# Patient Record
Sex: Female | Born: 1935 | Race: White | Hispanic: No | Smoking: Never smoker
Health system: Southern US, Community
[De-identification: ages and names within clinical notes are randomized; demographics above are authoritative.]

## PROBLEM LIST (undated history)

## (undated) DIAGNOSIS — I10 Essential (primary) hypertension: Secondary | ICD-10-CM

## (undated) DIAGNOSIS — I809 Phlebitis and thrombophlebitis of unspecified site: Secondary | ICD-10-CM

## (undated) DIAGNOSIS — E785 Hyperlipidemia, unspecified: Secondary | ICD-10-CM

## (undated) DIAGNOSIS — N3946 Mixed incontinence: Secondary | ICD-10-CM

## (undated) DIAGNOSIS — Z973 Presence of spectacles and contact lenses: Secondary | ICD-10-CM

## (undated) DIAGNOSIS — M069 Rheumatoid arthritis, unspecified: Secondary | ICD-10-CM

## (undated) DIAGNOSIS — M1990 Unspecified osteoarthritis, unspecified site: Secondary | ICD-10-CM

## (undated) DIAGNOSIS — K429 Umbilical hernia without obstruction or gangrene: Secondary | ICD-10-CM

## (undated) DIAGNOSIS — Z974 Presence of external hearing-aid: Secondary | ICD-10-CM

## (undated) DIAGNOSIS — F32A Depression, unspecified: Secondary | ICD-10-CM

## (undated) DIAGNOSIS — M25512 Pain in left shoulder: Secondary | ICD-10-CM

## (undated) DIAGNOSIS — Z972 Presence of dental prosthetic device (complete) (partial): Secondary | ICD-10-CM

## (undated) DIAGNOSIS — N3289 Other specified disorders of bladder: Secondary | ICD-10-CM

## (undated) DIAGNOSIS — E663 Overweight: Secondary | ICD-10-CM

## (undated) DIAGNOSIS — K08109 Complete loss of teeth, unspecified cause, unspecified class: Secondary | ICD-10-CM

## (undated) DIAGNOSIS — M549 Dorsalgia, unspecified: Secondary | ICD-10-CM

## (undated) DIAGNOSIS — I2510 Atherosclerotic heart disease of native coronary artery without angina pectoris: Secondary | ICD-10-CM

## (undated) DIAGNOSIS — R269 Unspecified abnormalities of gait and mobility: Secondary | ICD-10-CM

## (undated) DIAGNOSIS — M25511 Pain in right shoulder: Secondary | ICD-10-CM

## (undated) DIAGNOSIS — Z8489 Family history of other specified conditions: Secondary | ICD-10-CM

## (undated) DIAGNOSIS — F329 Major depressive disorder, single episode, unspecified: Secondary | ICD-10-CM

## (undated) DIAGNOSIS — F419 Anxiety disorder, unspecified: Secondary | ICD-10-CM

## (undated) DIAGNOSIS — Y92009 Unspecified place in unspecified non-institutional (private) residence as the place of occurrence of the external cause: Secondary | ICD-10-CM

## (undated) DIAGNOSIS — K589 Irritable bowel syndrome without diarrhea: Secondary | ICD-10-CM

## (undated) DIAGNOSIS — W19XXXA Unspecified fall, initial encounter: Secondary | ICD-10-CM

## (undated) DIAGNOSIS — N390 Urinary tract infection, site not specified: Secondary | ICD-10-CM

## (undated) DIAGNOSIS — G629 Polyneuropathy, unspecified: Secondary | ICD-10-CM

## (undated) DIAGNOSIS — K2970 Gastritis, unspecified, without bleeding: Secondary | ICD-10-CM

## (undated) HISTORY — DX: Atherosclerotic heart disease of native coronary artery without angina pectoris: I25.10

## (undated) HISTORY — DX: Unspecified osteoarthritis, unspecified site: M19.90

## (undated) HISTORY — PX: OTHER SURGICAL HISTORY: SHX169

## (undated) HISTORY — DX: Gastritis, unspecified, without bleeding: K29.70

## (undated) HISTORY — PX: EYE SURGERY: SHX253

## (undated) HISTORY — DX: Umbilical hernia without obstruction or gangrene: K42.9

## (undated) HISTORY — PX: TOTAL KNEE ARTHROPLASTY: SHX125

## (undated) HISTORY — DX: Pain in right shoulder: M25.511

## (undated) HISTORY — DX: Major depressive disorder, single episode, unspecified: F32.9

## (undated) HISTORY — DX: Depression, unspecified: F32.A

## (undated) HISTORY — DX: Phlebitis and thrombophlebitis of unspecified site: I80.9

## (undated) HISTORY — PX: SHOULDER ARTHROSCOPY: SHX128

## (undated) HISTORY — PX: APPENDECTOMY: SHX54

## (undated) HISTORY — PX: ABDOMINAL HYSTERECTOMY: SHX81

## (undated) HISTORY — DX: Dorsalgia, unspecified: M54.9

## (undated) HISTORY — DX: Anxiety disorder, unspecified: F41.9

## (undated) HISTORY — DX: Irritable bowel syndrome, unspecified: K58.9

## (undated) HISTORY — DX: Essential (primary) hypertension: I10

## (undated) HISTORY — PX: CHOLECYSTECTOMY: SHX55

## (undated) HISTORY — DX: Unspecified abnormalities of gait and mobility: R26.9

## (undated) HISTORY — DX: Hyperlipidemia, unspecified: E78.5

## (undated) HISTORY — PX: TONSILLECTOMY: SUR1361

## (undated) HISTORY — DX: Overweight: E66.3

## (undated) HISTORY — DX: Pain in left shoulder: M25.512

## (undated) HISTORY — PX: JOINT REPLACEMENT: SHX530

## (undated) HISTORY — PX: HEEL SPUR SURGERY: SHX665

## (undated) HISTORY — DX: Mixed incontinence: N39.46

---

## 1998-10-01 ENCOUNTER — Ambulatory Visit (HOSPITAL_BASED_OUTPATIENT_CLINIC_OR_DEPARTMENT_OTHER): Admission: RE | Admit: 1998-10-01 | Discharge: 1998-10-01 | Payer: Self-pay | Admitting: Orthopedic Surgery

## 1998-12-08 ENCOUNTER — Encounter: Payer: Self-pay | Admitting: Family Medicine

## 1998-12-08 ENCOUNTER — Encounter: Admission: RE | Admit: 1998-12-08 | Discharge: 1998-12-08 | Payer: Self-pay | Admitting: Family Medicine

## 2001-08-13 ENCOUNTER — Encounter: Payer: Self-pay | Admitting: Orthopedic Surgery

## 2001-08-13 ENCOUNTER — Encounter: Admission: RE | Admit: 2001-08-13 | Discharge: 2001-08-13 | Payer: Self-pay | Admitting: Orthopedic Surgery

## 2002-11-22 ENCOUNTER — Encounter: Admission: RE | Admit: 2002-11-22 | Discharge: 2002-11-22 | Payer: Self-pay | Admitting: Family Medicine

## 2002-11-22 ENCOUNTER — Encounter: Payer: Self-pay | Admitting: Family Medicine

## 2003-06-02 ENCOUNTER — Encounter: Admission: RE | Admit: 2003-06-02 | Discharge: 2003-06-02 | Payer: Self-pay | Admitting: Orthopedic Surgery

## 2003-07-29 ENCOUNTER — Other Ambulatory Visit: Admission: RE | Admit: 2003-07-29 | Discharge: 2003-07-29 | Payer: Self-pay | Admitting: Family Medicine

## 2003-07-30 ENCOUNTER — Encounter: Admission: RE | Admit: 2003-07-30 | Discharge: 2003-07-30 | Payer: Self-pay | Admitting: Family Medicine

## 2003-12-09 ENCOUNTER — Ambulatory Visit: Payer: Self-pay | Admitting: Family Medicine

## 2004-04-06 ENCOUNTER — Ambulatory Visit: Payer: Self-pay | Admitting: Family Medicine

## 2004-11-16 ENCOUNTER — Ambulatory Visit: Payer: Self-pay | Admitting: Family Medicine

## 2004-11-19 ENCOUNTER — Ambulatory Visit: Payer: Self-pay | Admitting: Family Medicine

## 2004-12-21 ENCOUNTER — Ambulatory Visit: Payer: Self-pay | Admitting: Family Medicine

## 2005-01-31 LAB — HM MAMMOGRAPHY

## 2005-09-23 ENCOUNTER — Ambulatory Visit: Payer: Self-pay | Admitting: Family Medicine

## 2005-10-12 ENCOUNTER — Ambulatory Visit: Payer: Self-pay | Admitting: Family Medicine

## 2005-11-01 ENCOUNTER — Ambulatory Visit: Payer: Self-pay | Admitting: Family Medicine

## 2006-03-28 ENCOUNTER — Ambulatory Visit: Payer: Self-pay | Admitting: Family Medicine

## 2006-09-13 ENCOUNTER — Ambulatory Visit: Payer: Self-pay | Admitting: Family Medicine

## 2006-11-01 ENCOUNTER — Ambulatory Visit: Payer: Self-pay | Admitting: Family Medicine

## 2006-11-03 ENCOUNTER — Ambulatory Visit: Payer: Self-pay | Admitting: Family Medicine

## 2006-11-03 DIAGNOSIS — K2990 Gastroduodenitis, unspecified, without bleeding: Secondary | ICD-10-CM

## 2006-11-03 DIAGNOSIS — I10 Essential (primary) hypertension: Secondary | ICD-10-CM

## 2006-11-03 DIAGNOSIS — M1990 Unspecified osteoarthritis, unspecified site: Secondary | ICD-10-CM

## 2006-11-03 DIAGNOSIS — K2970 Gastritis, unspecified, without bleeding: Secondary | ICD-10-CM | POA: Insufficient documentation

## 2006-11-03 DIAGNOSIS — F418 Other specified anxiety disorders: Secondary | ICD-10-CM

## 2006-11-03 DIAGNOSIS — E785 Hyperlipidemia, unspecified: Secondary | ICD-10-CM

## 2006-11-03 DIAGNOSIS — N3946 Mixed incontinence: Secondary | ICD-10-CM

## 2006-11-06 ENCOUNTER — Observation Stay (HOSPITAL_COMMUNITY): Admission: EM | Admit: 2006-11-06 | Discharge: 2006-11-07 | Payer: Self-pay | Admitting: Emergency Medicine

## 2006-11-06 ENCOUNTER — Encounter: Payer: Self-pay | Admitting: Family Medicine

## 2006-11-06 ENCOUNTER — Ambulatory Visit: Payer: Self-pay | Admitting: Internal Medicine

## 2006-11-06 ENCOUNTER — Ambulatory Visit: Payer: Self-pay | Admitting: Cardiology

## 2006-11-07 ENCOUNTER — Encounter: Payer: Self-pay | Admitting: Internal Medicine

## 2006-11-07 ENCOUNTER — Encounter: Payer: Self-pay | Admitting: Family Medicine

## 2006-11-21 ENCOUNTER — Telehealth: Payer: Self-pay | Admitting: Family Medicine

## 2006-12-20 ENCOUNTER — Telehealth: Payer: Self-pay | Admitting: Family Medicine

## 2006-12-26 ENCOUNTER — Ambulatory Visit: Payer: Self-pay | Admitting: Family Medicine

## 2007-01-08 ENCOUNTER — Ambulatory Visit: Payer: Self-pay | Admitting: Pulmonary Disease

## 2007-01-08 DIAGNOSIS — I2510 Atherosclerotic heart disease of native coronary artery without angina pectoris: Secondary | ICD-10-CM

## 2007-01-09 ENCOUNTER — Telehealth: Payer: Self-pay | Admitting: Family Medicine

## 2007-01-09 DIAGNOSIS — R269 Unspecified abnormalities of gait and mobility: Secondary | ICD-10-CM | POA: Insufficient documentation

## 2007-01-10 ENCOUNTER — Ambulatory Visit: Payer: Self-pay | Admitting: Pulmonary Disease

## 2007-01-11 ENCOUNTER — Telehealth (INDEPENDENT_AMBULATORY_CARE_PROVIDER_SITE_OTHER): Payer: Self-pay | Admitting: *Deleted

## 2007-01-15 ENCOUNTER — Telehealth (INDEPENDENT_AMBULATORY_CARE_PROVIDER_SITE_OTHER): Payer: Self-pay | Admitting: *Deleted

## 2007-01-16 ENCOUNTER — Ambulatory Visit: Payer: Self-pay | Admitting: Family Medicine

## 2007-01-30 ENCOUNTER — Encounter: Payer: Medicare Other | Admitting: Family Medicine

## 2007-01-31 ENCOUNTER — Encounter: Payer: Self-pay | Admitting: Family Medicine

## 2007-02-01 ENCOUNTER — Encounter: Payer: Medicare Other | Admitting: Family Medicine

## 2007-02-08 ENCOUNTER — Ambulatory Visit: Payer: Self-pay | Admitting: Pulmonary Disease

## 2007-02-27 ENCOUNTER — Encounter: Payer: Self-pay | Admitting: Family Medicine

## 2007-10-30 ENCOUNTER — Ambulatory Visit: Payer: Self-pay | Admitting: Family Medicine

## 2008-05-01 ENCOUNTER — Ambulatory Visit: Payer: Self-pay | Admitting: Family Medicine

## 2008-05-01 DIAGNOSIS — M25519 Pain in unspecified shoulder: Secondary | ICD-10-CM | POA: Insufficient documentation

## 2008-05-06 ENCOUNTER — Ambulatory Visit: Payer: Self-pay | Admitting: Family Medicine

## 2008-05-06 ENCOUNTER — Encounter: Payer: Self-pay | Admitting: Family Medicine

## 2008-05-06 ENCOUNTER — Ambulatory Visit: Payer: Self-pay

## 2008-05-07 ENCOUNTER — Encounter: Payer: Self-pay | Admitting: Family Medicine

## 2008-05-16 ENCOUNTER — Telehealth: Payer: Self-pay | Admitting: Family Medicine

## 2008-06-11 ENCOUNTER — Inpatient Hospital Stay (HOSPITAL_COMMUNITY): Admission: RE | Admit: 2008-06-11 | Discharge: 2008-06-16 | Payer: Self-pay | Admitting: Orthopedic Surgery

## 2008-07-04 ENCOUNTER — Ambulatory Visit: Payer: Medicare Other

## 2008-07-07 DIAGNOSIS — E669 Obesity, unspecified: Secondary | ICD-10-CM | POA: Insufficient documentation

## 2008-07-22 ENCOUNTER — Encounter: Payer: Self-pay | Admitting: Family Medicine

## 2008-09-09 ENCOUNTER — Encounter: Payer: Self-pay | Admitting: Family Medicine

## 2008-09-11 ENCOUNTER — Telehealth: Payer: Self-pay | Admitting: Family Medicine

## 2008-09-12 ENCOUNTER — Ambulatory Visit: Payer: Self-pay | Admitting: Family Medicine

## 2008-09-12 LAB — CONVERTED CEMR LAB
Bilirubin Urine: NEGATIVE
Glucose, Urine, Semiquant: NEGATIVE
Ketones, urine, test strip: NEGATIVE
RBC / HPF: 0
Specific Gravity, Urine: <1.005
pH: 8

## 2008-09-13 ENCOUNTER — Encounter: Payer: Self-pay | Admitting: Family Medicine

## 2008-09-30 ENCOUNTER — Telehealth (INDEPENDENT_AMBULATORY_CARE_PROVIDER_SITE_OTHER): Payer: Self-pay | Admitting: *Deleted

## 2008-10-02 ENCOUNTER — Ambulatory Visit: Payer: Self-pay | Admitting: Family Medicine

## 2008-10-02 LAB — CONVERTED CEMR LAB
Bilirubin Urine: NEGATIVE
Glucose, Urine, Semiquant: NEGATIVE
Ketones, urine, test strip: NEGATIVE
Protein, U semiquant: NEGATIVE
Specific Gravity, Urine: 1.015
Yeast, UA: 0

## 2008-10-03 ENCOUNTER — Encounter: Payer: Self-pay | Admitting: Family Medicine

## 2008-10-28 ENCOUNTER — Ambulatory Visit: Payer: Self-pay | Admitting: Family Medicine

## 2009-01-06 ENCOUNTER — Encounter: Payer: Self-pay | Admitting: Family Medicine

## 2009-02-05 ENCOUNTER — Telehealth: Payer: Self-pay | Admitting: Family Medicine

## 2009-04-24 ENCOUNTER — Encounter: Payer: Self-pay | Admitting: Family Medicine

## 2009-06-04 ENCOUNTER — Telehealth (INDEPENDENT_AMBULATORY_CARE_PROVIDER_SITE_OTHER): Payer: Self-pay | Admitting: *Deleted

## 2009-07-02 ENCOUNTER — Encounter: Payer: Self-pay | Admitting: Family Medicine

## 2009-07-14 ENCOUNTER — Encounter: Payer: Self-pay | Admitting: Family Medicine

## 2009-07-14 ENCOUNTER — Telehealth: Payer: Self-pay | Admitting: Family Medicine

## 2009-08-04 ENCOUNTER — Telehealth: Payer: Self-pay | Admitting: Family Medicine

## 2009-08-13 ENCOUNTER — Encounter: Payer: Self-pay | Admitting: Family Medicine

## 2009-08-26 ENCOUNTER — Encounter: Payer: Self-pay | Admitting: Family Medicine

## 2009-09-24 ENCOUNTER — Encounter: Payer: Self-pay | Admitting: Family Medicine

## 2009-10-21 ENCOUNTER — Ambulatory Visit: Payer: Self-pay | Admitting: Family Medicine

## 2009-11-19 ENCOUNTER — Encounter: Payer: Self-pay | Admitting: Family Medicine

## 2009-12-09 ENCOUNTER — Ambulatory Visit: Payer: Self-pay | Admitting: Family Medicine

## 2009-12-09 DIAGNOSIS — M810 Age-related osteoporosis without current pathological fracture: Secondary | ICD-10-CM | POA: Insufficient documentation

## 2009-12-14 LAB — CONVERTED CEMR LAB
ALT: 14 units/L (ref 0–35)
Albumin: 3.6 g/dL (ref 3.5–5.2)
Basophils Relative: 0.4 % (ref 0.0–3.0)
Calcium: 9.8 mg/dL (ref 8.4–10.5)
Creatinine, Ser: 0.6 mg/dL (ref 0.4–1.2)
Eosinophils Relative: 2 % (ref 0.0–5.0)
Glucose, Bld: 96 mg/dL (ref 70–99)
HDL: 53.4 mg/dL (ref >39.00)
LDL Cholesterol: 93 mg/dL (ref 0–99)
Lymphocytes Relative: 22.5 % (ref 12.0–46.0)
Monocytes Relative: 8.8 % (ref 3.0–12.0)
Neutrophils Relative %: 66.3 % (ref 43.0–77.0)
RBC: 4.2 M/uL (ref 3.87–5.11)
Sodium: 142 meq/L (ref 135–145)
Total Bilirubin: 0.5 mg/dL (ref 0.3–1.2)
Total Protein: 6.1 g/dL (ref 6.0–8.3)
VLDL: 22.6 mg/dL (ref 0.0–40.0)
WBC: 6.4 10*3/uL (ref 4.5–10.5)

## 2010-02-04 ENCOUNTER — Encounter: Payer: Self-pay | Admitting: Family Medicine

## 2010-02-20 ENCOUNTER — Encounter: Payer: Self-pay | Admitting: Family Medicine

## 2010-02-28 LAB — CONVERTED CEMR LAB
ALT: 13 units/L (ref 0–35)
Albumin: 3.6 g/dL (ref 3.5–5.2)
Alkaline Phosphatase: 73 units/L (ref 39–117)
Basophils Relative: 0.3 % (ref 0.0–1.0)
CO2: 31 meq/L (ref 19–32)
Calcium: 9.4 mg/dL (ref 8.4–10.5)
Chloride: 106 meq/L (ref 96–112)
Cholesterol: 226 mg/dL (ref 0–200)
Direct LDL: 140.1 mg/dL
Eosinophils Absolute: 0.1 10*3/uL (ref 0.0–0.6)
Eosinophils Relative: 1.4 % (ref 0.0–5.0)
Glucose, Bld: 91 mg/dL (ref 70–99)
HDL: 46.6 mg/dL (ref >39.0)
Lymphocytes Relative: 20.6 % (ref 12.0–46.0)
Monocytes Relative: 9.3 % (ref 3.0–11.0)
Neutro Abs: 4.4 10*3/uL (ref 1.4–7.7)
Platelets: 303 10*3/uL (ref 150–400)
RBC: 4.45 M/uL (ref 3.87–5.11)
Sodium: 141 meq/L (ref 135–145)
TSH: 0.97 microintl units/mL (ref 0.35–5.50)
Total Protein: 6.8 g/dL (ref 6.0–8.3)
Triglycerides: 202 mg/dL (ref 0–149)
VLDL: 40 mg/dL (ref 0–40)
WBC: 6.4 10*3/uL (ref 4.5–10.5)

## 2010-03-04 NOTE — Letter (Signed)
Summary: [name]  [name]   Imported By: Lanelle Bal 08/21/2009 11:34:28  _____________________________________________________________________  External Attachment:    Type:   Image     Comment:   External Document

## 2010-03-04 NOTE — Letter (Signed)
Summary: [name]  [name]   Imported By: Lanelle Bal 10/02/2009 14:17:53  _____________________________________________________________________  External Attachment:    Type:   Image     Comment:   External Document

## 2010-03-04 NOTE — Letter (Signed)
Summary: [facility]  [facility]   Imported By: Lanelle Bal 07/21/2009 12:52:45  _____________________________________________________________________  External Attachment:    Type:   Image     Comment:   External Document

## 2010-03-04 NOTE — Letter (Signed)
Summary: [name]  [name]   Imported By: Maryln Gottron 08/31/2009 14:54:51  _____________________________________________________________________  External Attachment:    Type:   Image     Comment:   External Document

## 2010-03-04 NOTE — Progress Notes (Signed)
Summary: Rx Vesicare  Phone Note Refill Request Call back at [phone] Message from:  [facility] on February 05, 4538 8:17 AM  Refills Requested: Medication #1:  VESICARE 5 MG TABS Take 1 tablet by mouth once a day.   Last Refilled: 01/05/2009 Received faxed refill request, please advise   Method Requested: Electronic Initial call taken by: Linde Gillis CMA Duncan Dull),  February 05, 2009 8:18 AM  Follow-up for Phone Call        px written on EMR for call in  Follow-up by: Judith Part MD,  February 05, 2009 8:37 AM    Prescriptions: VESICARE 5 MG TABS (SOLIFENACIN SUCCINATE) Take 1 tablet by mouth once a day  #30 x 5   Entered by:   Lowella Petties CMA   Authorized by:   Judith Part MD   Signed by:   Lowella Petties CMA on 02/05/2009   Method used:   [facility]        [id number]       [address]       [place], [place]  [address]       Ph: [phone]       Fax: [phone]   RxID:   [id number]

## 2010-03-04 NOTE — Letter (Signed)
Summary: [facility]  [facility]   Imported By: Lanelle Bal 07/21/2009 12:51:56  _____________________________________________________________________  External Attachment:    Type:   Image     Comment:   External Document

## 2010-03-04 NOTE — Progress Notes (Signed)
----   Converted from flag ---- ---- 06/04/2009 8:01 AM, Delilah Shan CMA (AAMA) wrote: Kendal Hymen (caregiver advised).  She says she will schedule appt. for late August or early September when her husband needs to come see Dr. Hetty Ely and she can bring them together.  Please extend RF's until that time.  Ninetta Lights, CMA  ---- 06/04/2009 8:01 AM, Colon Flattery Tower MD wrote: yes, thanks (she is barely mobile) -- please ask her to make f/u appt this summer- thanks   ---- 06/03/2009 3:14 PM, Delilah Shan CMA (AAMA) wrote: I approved this refill x 1 but she hasn't been in the office in a long time.  Is she disabled or does she need OV? ------------------------------

## 2010-03-04 NOTE — Letter (Signed)
Summary: [name]  [name]   Imported By: Lanelle Bal 02/15/2010 07:42:12  _____________________________________________________________________  External Attachment:    Type:   Image     Comment:   External Document

## 2010-03-04 NOTE — Letter (Signed)
Summary: [name]  [name]   Imported By: Lanelle Bal 11/30/2009 11:42:36  _____________________________________________________________________  External Attachment:    Type:   Image     Comment:   External Document

## 2010-03-04 NOTE — Letter (Signed)
Summary: CMN for [facility]  CMN for [facility]   Imported By: Lanelle Bal 04/30/2009 12:30:52  _____________________________________________________________________  External Attachment:    Type:   Image     Comment:   External Document
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
## 2010-03-04 NOTE — Progress Notes (Signed)
Summary: Vesicare  Phone Note Refill Request Message from:  Scriptline on August 04, 2009 12:18 PM  Refills Requested: Medication #1:  VESICARE 5 MG TABS Take 1 tablet by mouth once a day Patient has not had OV in some time.  ?  [facility]  [address] [id number]   Last Lenox Ahr Date:  No date sent   Pharmacy Phone:  [phone]   Method Requested: Electronic Initial call taken by: Delilah Shan CMA Duncan Dull),  August 04, 2009 12:18 PM  Follow-up for Phone Call        please ask her to f/u with me late summer or early fall px written on EMR for call in  Follow-up by: Judith Part MD,  August 04, 2009 1:22 PM  Additional Follow-up for Phone Call Additional follow up Details #1::        [facility]. Unable to reach pt by phone so Algernon Huxley at pharmacy will add note to rx for pt to call for appt late summer or early fall.Lewanda Rife LPN  August 04, 1912 4:25 PM     New/Updated Medications: VESICARE 5 MG TABS (SOLIFENACIN SUCCINATE) Take 1 tablet by mouth once a day Prescriptions: VESICARE 5 MG TABS (SOLIFENACIN SUCCINATE) Take 1 tablet by mouth once a day  #30 x 5   Entered and Authorized by:   Judith Part MD   Signed by:   Lewanda Rife LPN on 78/29/5621   Method used:   Telephoned to ...       [id number]       [address]       [place], [place]  [address]       Ph: [phone]       Fax: [phone]   RxID:   [id number]

## 2010-03-04 NOTE — Progress Notes (Signed)
Summary: Alendronate  Phone Note Refill Request Message from:  Scriptline on July 14, 2009 12:21 PM  Refills Requested: Medication #1:  ALENDRONATE SODIUM 70 MG TABS Take one tablet weekly before breakfast and stay upright for 30 minutes.. [facility]  [address] [id number]   Last Fill Date:  No date sent   Pharmacy Phone:  [phone]  Not on meds list. This was added to patient's meds list for refill purposes.  Patient has not been in office since 05/2008.   Method Requested: Electronic Initial call taken by: Delilah Shan CMA Duncan Dull),  July 14, 2009 12:21 PM  Follow-up for Phone Call        I cannot find any info on that and was not on med list - please ask pt if it came from another doc or [place] before computer began? - and what for .. thanks  also sched f/u with me this summer to check in   Follow-up by: Judith Part MD,  July 14, 2009 12:29 PM  Additional Follow-up for Phone Call Additional follow up Details #1::        Left message for patient to call back. Lewanda Rife LPN  July 15, 2009 8:17 AM   Left message for patient to call back. Lewanda Rife LPN  July 15, 2009 4:34 PM   Patient's daughter in law Tammatha Cobb [phone] notified as instructed by telephone. Kendal Hymen said she could not remember the name of Dr that ordered med . [facility] Dr. Carlean Jews was original prescriber. Pt has not seen him in over 1 yr. and last refill was given 05/2009 by Alessandra Grout for 2 refills.  [facility] to Dr Milinda Antis since she is primary care. Kendal Hymen will call and schedule appt with Dr. Milinda Antis for pt when Kendal Hymen has her calendar available.Lewanda Rife LPN  July 16, 2009 10:05 AM     Additional Follow-up for Phone Call Additional follow up Details #2::    before I refil this - want to review last 1-2 notes from Dr Carlean Jews- please send for them , thank you Follow-up by: Judith Part MD,  July 16, 2009 10:35 AM  Additional Follow-up for Phone Call Additional follow up Details #3::  Details for Additional Follow-up Action Taken: I called Dr. Wadie Lessen office [phone] and requested records via v/m to the medical records dept at Dr Wadie Lessen office. I left the request to be faxed to [phone] and if they had any questions could call me at [phone].Lewanda Rife LPN  July 16, 2009 10:46 AM   Spoke with Larita Fife at medical records Dr Wadie Lessen office [phone]. Larita Fife will fax records to Dr Milinda Antis now.Lewanda Rife LPN  July 16, 2009 12:29 PM   Fax received and on your desk.Lewanda Rife LPN  July 16, 2009 12:51 PM   I reviewed them- no mention of Op med  have her continue to hold it and f/u with me this summer to discuss Additional Follow-up by: Judith Part MD,  July 16, 2009 12:57 PM  Patient's daughter in law Aianna Fahs notified as instructed by telephone. Lewanda Rife LPN  July 16, 2009 1:06 PM

## 2010-03-04 NOTE — Assessment & Plan Note (Signed)
Summary: FLU SHOT/TOWER/DLO  Nurse Visit   Allergies: 1)  ! Pcn  Immunizations Administered:  Influenza Vaccine # 1:    Vaccine Type: Fluvax 3+    Site: left deltoid    Mfr: GlaxoSmithKline    Dose: 0.5 ml    Route: IM    Given by: Mervin Hack CMA (AAMA)    Exp. Date: 07/31/2010    Lot #: [id number]    VIS given: 08/25/09 version given October 21, 2009.  Flu Vaccine Consent Questions:    Do you have a history of severe allergic reactions to this vaccine? no    Any prior history of allergic reactions to egg and/or gelatin? no    Do you have a sensitivity to the preservative Thimersol? no    Do you have a past history of Guillan-Barre Syndrome? no    Do you currently have an acute febrile illness? no    Have you ever had a severe reaction to latex? no    Vaccine information given and explained to patient? yes    Are you currently pregnant? no  Orders Added: 1)  Flu Vaccine 11yrs + [90658] 2)  Admin 1st Vaccine [57846]

## 2010-03-04 NOTE — Miscellaneous (Signed)
  Clinical Lists Changes  Medications: Added new medication of ALENDRONATE SODIUM 70 MG TABS (ALENDRONATE SODIUM) Take one tablet weekly before breakfast and stay upright for 30 minutes.

## 2010-03-04 NOTE — Letter (Signed)
Summary: [name]  [name]   Imported By: Lanelle Bal 07/10/2009 09:00:43  _____________________________________________________________________  External Attachment:    Type:   Image     Comment:   External Document

## 2010-03-29 ENCOUNTER — Encounter: Payer: Self-pay | Admitting: Family Medicine

## 2010-04-13 NOTE — Letter (Signed)
Summary: [facility]  [facility]   Imported By: Kassie Mends 04/02/2010 09:31:32  _____________________________________________________________________  External Attachment:    Type:   Image     Comment:   External Document

## 2010-05-11 LAB — DIFFERENTIAL
Basophils Absolute: 0 10*3/uL (ref 0.0–0.1)
Basophils Relative: 0 % (ref 0–1)
Eosinophils Absolute: 0.1 10*3/uL (ref 0.0–0.7)
Eosinophils Relative: 2 % (ref 0–5)
Lymphocytes Relative: 24 % (ref 12–46)
Lymphs Abs: 1.6 10*3/uL (ref 0.7–4.0)
Monocytes Absolute: 0.5 10*3/uL (ref 0.1–1.0)
Monocytes Relative: 7 % (ref 3–12)
Neutro Abs: 4.4 10*3/uL (ref 1.7–7.7)
Neutrophils Relative %: 67 % (ref 43–77)

## 2010-05-11 LAB — BASIC METABOLIC PANEL
BUN: 8 mg/dL (ref 6–23)
CO2: 28 mEq/L (ref 19–32)
Calcium: 8.4 mg/dL (ref 8.4–10.5)
Calcium: 9.5 mg/dL (ref 8.4–10.5)
Chloride: 105 mEq/L (ref 96–112)
Creatinine, Ser: 0.65 mg/dL (ref 0.4–1.2)
GFR calc Af Amer: >60 mL/min (ref >60)
GFR calc Af Amer: >60 mL/min (ref >60)
GFR calc non Af Amer: >60 mL/min (ref >60)
GFR calc non Af Amer: >60 mL/min (ref >60)
Glucose, Bld: 181 mg/dL — ABNORMAL HIGH (ref 70–99)
Glucose, Bld: 92 mg/dL (ref 70–99)
Potassium: 3.7 mEq/L (ref 3.5–5.1)
Sodium: 137 mEq/L (ref 135–145)
Sodium: 142 mEq/L (ref 135–145)

## 2010-05-11 LAB — GRAM STAIN

## 2010-05-11 LAB — PROTIME-INR
INR: 1.7 — ABNORMAL HIGH (ref 0.00–1.49)
INR: 1.7 — ABNORMAL HIGH (ref 0.00–1.49)
INR: 1 (ref 0.00–1.49)
INR: 1.9 — ABNORMAL HIGH (ref 0.00–1.49)
Prothrombin Time: 20.7 seconds — ABNORMAL HIGH (ref 11.6–15.2)
Prothrombin Time: 20.9 seconds — ABNORMAL HIGH (ref 11.6–15.2)
Prothrombin Time: 15.8 seconds — ABNORMAL HIGH (ref 11.6–15.2)
Prothrombin Time: 22.7 seconds — ABNORMAL HIGH (ref 11.6–15.2)

## 2010-05-11 LAB — APTT: aPTT: 32 seconds (ref 24–37)

## 2010-05-11 LAB — ANAEROBIC CULTURE

## 2010-05-11 LAB — URINALYSIS, ROUTINE W REFLEX MICROSCOPIC
Bilirubin Urine: NEGATIVE
Glucose, UA: NEGATIVE mg/dL
Hgb urine dipstick: NEGATIVE
Ketones, ur: NEGATIVE mg/dL
Nitrite: NEGATIVE
Protein, ur: NEGATIVE mg/dL
Specific Gravity, Urine: 1.011 (ref 1.005–1.030)
Urobilinogen, UA: 0.2 mg/dL (ref 0.0–1.0)
pH: 7.5 (ref 5.0–8.0)

## 2010-05-11 LAB — CBC
HCT: 28.3 % — ABNORMAL LOW (ref 36.0–46.0)
Hemoglobin: 13.1 g/dL (ref 12.0–15.0)
MCV: 90.4 fL (ref 78.0–100.0)
MCV: 90.5 fL (ref 78.0–100.0)
Platelets: 157 10*3/uL (ref 150–400)
Platelets: 194 10*3/uL (ref 150–400)
RBC: 2.84 MIL/uL — ABNORMAL LOW (ref 3.87–5.11)
RBC: 4.24 MIL/uL (ref 3.87–5.11)
RDW: 13.4 % (ref 11.5–15.5)
RDW: 13.9 % (ref 11.5–15.5)
WBC: 7.4 10*3/uL (ref 4.0–10.5)
WBC: 9.3 10*3/uL (ref 4.0–10.5)
WBC: 9.8 10*3/uL (ref 4.0–10.5)

## 2010-05-11 LAB — ABO/RH: ABO/RH(D): A POS

## 2010-05-11 LAB — URINE MICROSCOPIC-ADD ON

## 2010-05-11 LAB — WOUND CULTURE

## 2010-05-11 LAB — TYPE AND SCREEN

## 2010-06-07 ENCOUNTER — Encounter: Payer: Self-pay | Admitting: Internal Medicine

## 2010-06-09 ENCOUNTER — Ambulatory Visit (INDEPENDENT_AMBULATORY_CARE_PROVIDER_SITE_OTHER): Payer: Medicare Other | Admitting: Internal Medicine

## 2010-06-09 ENCOUNTER — Encounter: Payer: Self-pay | Admitting: Internal Medicine

## 2010-06-09 DIAGNOSIS — F411 Generalized anxiety disorder: Secondary | ICD-10-CM

## 2010-06-09 DIAGNOSIS — I10 Essential (primary) hypertension: Secondary | ICD-10-CM

## 2010-06-09 DIAGNOSIS — E785 Hyperlipidemia, unspecified: Secondary | ICD-10-CM
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
## 2010-06-10 ENCOUNTER — Encounter: Payer: Self-pay | Admitting: Internal Medicine

## 2010-06-15 NOTE — Discharge Summary (Signed)
Tracy Sharp, Tracy Sharp                   ACCOUNT NO.:  [id number]   MEDICAL RECORD NO.:  [id number]          PATIENT TYPE:  INP   LOCATION:  5040                         FACILITY:  [facility]   PHYSICIAN:  Feliberto Gottron. Turner Daniels, M.D.   DATE OF BIRTH:  [birth year]   DATE OF ADMISSION:  06/11/2008  DATE OF DISCHARGE:  06/16/2008                               DISCHARGE SUMMARY   HISTORY OF PRESENT ILLNESS:  This is an 77 year old lady who complains  of severe unremitting pain in her left knee, 20 years status post left  total knee arthroplasty.  Her pain is making ambulation and activities  of daily living difficult.  X-rays are suspicious for loosening of the  total knee prosthesis.  She desires a surgical intervention.  All risks  and benefits of surgery were discussed with the patient.   PAST MEDICAL HISTORY:  Significant for:  1. Depression.  2. Gout.  3. Reflux.   PAST SURGICAL HISTORY:  She had:  1. Left total knee arthroplasty in 1990.  2. Right total knee arthroplasty in 1995.  3. Hysterectomy.  4. Cervical and lumbar surgeries.   SOCIAL HISTORY:  She denies the use of tobacco or alcohol.   FAMILY HISTORY:  Noncontributory.   She has no known drug allergies.   CURRENT MEDICATIONS:  1. Detrol 4 mg 1 p.o. daily.  2. Lasix 20 mg 1 p.o. daily.  3. Omeprazole 20 mg 1 p.o. daily.  4. BuSpar 15 mg 1 p.o. b.i.d.  5. Fosamax 70 mg 1 p.o. weekly.  6. Aspirin 81 mg 1 p.o. daily.  7. Darvocet-N 100 one p.o. b.i.d. p.r.n. pain.   PHYSICAL EXAMINATION:  Gross examination of the left knee demonstrates  the patient's range of motion to be 0-110 degrees.  There is tenderness  to palpation along the proximal tibial.  She is neurovascularly intact.  X-rays demonstrate osteolysis at the tibia and wear in the total knee  bearing.  The femoral component appears well placed and well fixed.   PREOPERATIVE LABORATORIES:  White blood cells 6.6, red blood cells 4.24,  hemoglobin 13.1, hematocrit 38.3,  platelets 251.  PT 13.4, INR 1.0, PTT  32, sodium 142, potassium 4.5, chloride 104, glucose 92.  BUN 21,  creatinine 0.56.  Urinalysis was within normal limits.   HOSPITAL COURSE:  Ms. Biegler was admitted to [facility] on Jun 11, 2008, when she underwent revision of a left total knee arthroplasty.  She procedure was performed by Dr. Gean Birchwood and the patient tolerated  it well.  Two Hemovac drains were placed into the left knee and a  perioperative Foley catheter was placed.  A synovial fluid sample was  sent off during surgery to pathology and was found to have no organisms.  The patient was transferred from recovery up to the orthopedic floor.  She was placed on Lovenox and Coumadin for DVT prophylaxis.  On the  first postoperative day the patient was awake and alert, she denied any  nausea or vomiting and reported that her pain was well controlled.  Hemoglobin was 11.2.  Surgical drains were removed, dressing was clean.  On the second postoperative day the patient was progressing slowly with  physical therapy, her hemoglobin was 9.6.  Cervical dressing was changed  and her incision was benign.  On the third postoperative day, the  patient continued to progress slowly with physical therapy.  She was  evaluated for placement in a skilled nursing facility.  On the fourth  postoperative day, the patient was eating well, she continued to deny  any nausea or vomiting and she was working well with physical therapy.  On the fifth postoperative day, the patient was able to ambulate 220  feet with physical therapy.  She reported that her pain was well  controlled and she was discharged to a skilled nursing facility.   DISPOSITION:  The patient was discharged to skilled nursing for a short  term rehab stay on 06/16/2008.  She is weightbearing as tolerated and  will return to the clinic in 7-10 days for x-rays and staple removal.   DISCHARGE MEDICINES:  Will be as per the HMR, with the  addition of:  1. Percocet 5 mg 1-2 tablets p.o. q.4 h. p.r.n. pain.  2. Coumadin dosed by the pharmacy, to take as directed, with a target      INR of 1.5 to 2.   Her Coumadin, physical therapy and wound will be managed by skilled  nursing.   FINAL DIAGNOSIS:  Loose left total knee arthroplasty.      Shirl Harris, PA      Feliberto Gottron. Turner Daniels, M.D.  Electronically Signed    JW/MEDQ  D:  06/16/2008  T:  06/16/2008  Job:  [id number]

## 2010-06-15 NOTE — H&P (Signed)
Tracy Sharp, Tracy Sharp                   ACCOUNT NO.:  [id number]   MEDICAL RECORD NO.:  [id number]            PATIENT TYPE:   LOCATION:                                 FACILITY:   PHYSICIAN:  Bevelyn Buckles. Bensimhon, MD     DATE OF BIRTH:   DATE OF ADMISSION:  11/06/2006  DATE OF DISCHARGE:                              HISTORY & PHYSICAL   REFERRING PHYSICIAN:  Marne A. Tower, MD.   REASON FOR EVALUATION:  Chest pain and shortness of breath.   HISTORY OF PRESENT ILLNESS:  Ms. Bonsall is a very pleasant, 77 year old  woman with a history of morbid obesity, bifascicular block and  noncardiac chest pain. She underwent cardiac catheterization in 1996 by  Dr. Corliss Marcus for an abnormal EKG and chest pain during a preop  workup for a knee replacement. This showed normal coronaries. She was  again evaluated in 2000 with similar symptoms and was provided  reassurance.   She recently saw Dr. Milinda Antis, her primary care physician, for nearly a  year history of progressive dyspnea. She says this is much worse over  the past 2-3 months. She also complains of chest pressure which happens  about 2-3 times per week with exertion or at rest. She also has one  pillow orthopnea and occasional PND.   This morning she tells me she woke up at 2 or 3 in the morning with  chest fullness and shortness of breath. She got up, took an aspirin and  sat in the recliner and was able to go back to bed. She came today for  her regular appointment. She is now pain free. She denies any nausea or  vomiting. She has had some palpitations and chronic dizziness but no  frank syncope.   REVIEW OF SYSTEMS:  Notable for severe problems with arthritis,  gastroesophageal reflux disease and fatigue. She is unsure if she snores  or not. She has not had any focal neurologic symptoms. The remainder of  the review of systems is negative except for HPI and problem list.   PROBLEM LIST:  1. Obesity.  2. Severe arthritis.  3.  Bifascicular block with bundle branch block and left anterior      fascicular block, this is chronic.  4. Chest pain with normal coronary arteries by catheterization in      1996.  5. Mild hyperlipidemia.   CURRENT MEDICATIONS:  1. Detrol LA 4 mg a day.  2. Prilosec 20 mg a day.  3. Buspirone.  4. Baby aspirin.   ALLERGIES:  No known drug allergies.   SOCIAL HISTORY:  She is married, she has 2 kids. She denies any tobacco  or alcohol use.   FAMILY HISTORY:  Mother died at 28 due to heart failure. There is no  family history of premature coronary artery disease. Sister is alive at  74.   PHYSICAL EXAMINATION:  She is an elderly woman in no acute distress. She  ambulates around the clinic slowly with use of a cane. She often uses a  walker at home. There is  no obvious respiratory difficulty.  Blood pressure is 122/62 with a heart rate of 83, her weight is 195  pounds.  HEENT:  Normal except for a mildly injected conjunctiva.  NECK:  Supple. There is no JVD. Carotids are 2+ bilaterally with  bilateral radiated bruits. There is no lymphadenopathy or thyromegaly.  CARDIAC:  PMI  is nondisplaced. She has a regular rate and rhythm with a  2/6 systolic ejection murmur at the right sternal border. S2 is  preserved.  LUNGS:  Clear.  ABDOMEN:  Obese, nontender, nondistended, no hepatosplenomegaly, no  bruits, no masses.  EXTREMITIES:  Warm with no cyanosis, clubbing or edema. Distal pulses  are intact. No rash.  NEUROLOGIC:  Alert and oriented x3. Cranial nerves II-XII are intact  except for the fact that she is hard of hearing. She moves all 4  extremities without difficulty. Affect is pleasant. EKG shows normal  sinus rhythm at a rate of 83. There is right bundle branch block and a  left anterior fascicular block consistent with bifascicular block. No  significant ST-T wave abnormalities.   Recent BNP is 87. White count was 6.4, hemoglobin was 13.9, platelets  are 303. Her  creatinine is 0.5, sodium 141 with potassium of 4.1.   ASSESSMENT:  1. Chest pain and shortness of breath concerning for unstable angina.  2. Question of a mild aortic stenosis murmur versus aortosclerosis on      exam.  3. Obesity.  4. Hyperlipidemia.   PLAN/DISCUSSION:  Her symptoms are concerning for unstable angina. Will  admit to her to the hospital for a right and left heart catheterization  and a 2-D echocardiogram.      Bevelyn Buckles. Bensimhon, MD  Electronically Signed     DRB/MEDQ  D:  11/06/2006  T:  11/06/2006  Job:  [id number]

## 2010-06-15 NOTE — Cardiovascular Report (Signed)
Tracy Sharp, Tracy Sharp                   ACCOUNT NO.:  [id number]   MEDICAL RECORD NO.:  [id number]          PATIENT TYPE:  INP   LOCATION:  6531                         FACILITY:  [facility]   PHYSICIAN:  Bruce R. Juanda Chance, MD, FACCDATE OF BIRTH:  05-30-1924   DATE OF PROCEDURE:  11/06/2006  DATE OF DISCHARGE:                            CARDIAC CATHETERIZATION   PRIMARY CARE PHYSICIAN:  Marne A. Tower, MD.   CARDIOLOGIST:  Bevelyn Buckles. Bensimhon, MD   CLINICAL HISTORY:  [name] is 77 years old and was seen in the office  in [place] today by Dr. Nicholes Mango with symptoms of shortness of  breath and some chest discomfort.  These symptoms had worsened today and  had become acute.  She was transported by ambulance for further  evaluation with angiography with a diagnosis of possible acute coronary  syndrome.   PROCEDURE:  Left heart catheterization was performed percutaneously via  the right femoral vein using venous sheath and Swan-Ganz thermodilution  catheter.  Right heart catheterization was formed percutaneously via the  right femoral vein using a venous sheath and Swan-Ganz thermodilution  catheter.  The patient tolerated the procedure well and left the  laboratory in satisfactory condition.   RESULTS:   HEMODYNAMIC DATA:  The right atrial pressure was 2 mean.  The pulmonary  artery pressure of 27/8 with mean of 16.  The pulmonary wedge pressure  11 mean.  Left ventricular pressure was 156/15.  Aortic pressure 156/64  with mean of 92.   The cardiac output/cardiac index was 3.5/1.8 liters/minute/meters  squared by Fick.   ANGIOGRAPHY:  Left main coronary artery:  The left main coronary artery  was free of significant disease.   Left anterior descending artery:  The left anterior descending artery  gave rise to two diagonal branches and two septal perforators.  The LAD  was irregular with no major obstruction.   Circumflex artery:  The circumflex artery gave rise to a marginal  branch  and an AV branch.  These vessels were free of significant disease.   Right coronary artery:  The right coronary artery is a moderate-size  vessel that gave rise to a right ventricular branch, posterior  descending branch, and three posterolateral branches.  These vessels  were free of significant disease.   Left ventriculogram:  The left ventriculogram performed in the RAO  projection showed good wall motion with no areas of hypokinesis.  The  estimated ejection fraction was 60%.   CONCLUSION:  Normal coronary angiography except for minimal  irregularities in the left anterior descending and normal left  ventricular function.   RECOMMENDATIONS:  Reassurance.  There is no source of ischemia and no  cause identified for the patient's recent shortness of breath.  I spoke  with Dr. Gala Romney, and he thinks many of her symptoms may be anxiety  related.  Will get a D-dimer, and Dr. Gala Romney has ordered an  echocardiogram to rule out other causes. If things are stable, will plan  discharge tomorrow.      Bruce Elvera Lennox Juanda Chance, MD, [facility]  Electronically Signed  BRB/MEDQ  D:  11/06/2006  T:  11/06/2006  Job:  [id number]   cc:   Marne A. Milinda Antis, MD  Bevelyn Buckles. Bensimhon, MD

## 2010-06-15 NOTE — Op Note (Signed)
Tracy Sharp, WAREING                   ACCOUNT NO.:  [id number]   MEDICAL RECORD NO.:  [id number]          PATIENT TYPE:  INP   LOCATION:  5040                         FACILITY:  [facility]   PHYSICIAN:  Feliberto Gottron. Turner Daniels, M.D.   DATE OF BIRTH:  1924/10/08   DATE OF PROCEDURE:  06/11/2008  DATE OF DISCHARGE:                               OPERATIVE REPORT   PREOPERATIVE DIAGNOSIS:  Loose left total knee arthroplasty with  impending pathological fracture through the proximal tibia.   POSTOPERATIVE DIAGNOSIS:  Loose left total knee arthroplasty with  impending pathological fracture through the proximal tibia.   PROCEDURE:  Revision left total knee arthroplasty with removal of a  loose patellar component, #7 button replaced with a #7 button.  The  tibial component was also removed and revised to a #7 TS baseplate with  a 12 x 155 stem which bypassed the pathological cyst that was there  secondary to polyethylene wear.  Both components were cemented with  double batch of Howmedica simplex cement.   SURGEON:  Feliberto Gottron. Turner Daniels, MD   FIRST ASSISTANT:  Shirl Harris, PA-C   ANESTHETIC:  General endotracheal.   ESTIMATED BLOOD LOSS:  Minimal.   FLUID REPLACEMENT:  1500 mL of crystalloid.   TOURNIQUET TIME:  1 hour and 55 minutes.   DRAINS PLACED:  Foley catheter and 2 medium Hemovacs.   URINE OUTPUT:  About 250 mL.   INDICATIONS FOR PROCEDURE:  An 77 year old woman who underwent a primary  total knee arthroplasty with Dr. Salvatore Marvel back in 1993 and she did  well until recently when she came in complaining of some pain in her  proximal tibia on the left side.  In any event, she was evaluated and  found to have a large cyst below the tip of the tibial implant that went  basically from wall to wall and was eroding through the wall medially  and laterally as well as anteriorly right below the tibial tubercle of  the proximal tibia.  The pain was bad enough where she had to use a  walker  and diagnosis of impending pathological fracture with probable  loosening of the tibial component was made.  I saw her in consultation  and concurred with the assessment and we got her set up for a revision  of her left total knee arthroplasty preferably using a long stem implant  to bypass the area of impending fracture and __________ the plan was to  pack the area with cement and again put a long 12-mm stem on the new  baseplate.  We would also revise any other loose implants.  Risks and  benefits of surgery discussed and questions answered.   DESCRIPTION OF PROCEDURE:  The patient identified by armband and  received IV antibiotics preoperatively in the holding area and then was  taken to operating room where the appropriate anesthetic monitors were  attached and general endotracheal anesthesia induced with the patient in  supine position.  A Foley catheter was inserted.  Tourniquet applied  high to the left thigh.  Lateral post and  foot positioner applied to the  left side of the table.  Left lower extremity prepped and draped in  usual sterile fashion from the ankle to the tourniquet.  Time-out  procedure performed.  Limb wrapped with an Esmarch bandage and  tourniquet inflated to 350 mmHg and we began the procedure by recreating  the anterior midline incision starting handbreadth above the patella  going over the patella and a centimeter medial to an about 5 cm distal  to the tibial tubercle which was an extension of the old incision in  anticipation of the dissection needed to expose the proximal tibia.  Small bleeders in the skin and subcutaneous tissues were identified and  cauterized.  We reflected the patellar retinaculum medially and  performed a medial parapatellar arthrotomy and high proximally performed  a small quadriceps snip to help in exposure and eversion of the patella.  The superficial medial collateral ligament was elevated from proc  anterior to posterior along the  proximal metaphyseal flare of the tibia,  extending it down to the junction of the diaphysis of the metaphysis to  give [place] good medial exposure.  Scar tissue was removed from the  prepatellar fat pad area allowing eversion of the patella and the knee  was then bent 30 degrees and using a small osteotome, we removed the  polyethylene bearing which had obvious polyethylene wear with subsurface  shearing of the polyethylene.  The knee was then hyperflexed to about  130 degrees and we began the process of removing scar tissue from around  the tibial implant and rotating the knee externally to get the tibia out  from beneath the femur.  Once this had been accomplished, we then used a  combination of a small ACL saw and half inch osteotome to break down the  remaining interface between the proximal tibia and the tibial baseplate  and then extracted the tibia.  At this time, we also assessed the  patella and found to be grossly loose and this was removed without  difficulty and membranes stripped from the sclerotic undersurface of the  patella.  The proximal tibia now exposed.  We then reamed up to a 12-mm  reamer to the depth for 155 stem, placed a cutting guide in place with a  12-mm stem on trial stem, and performed a 2-mm cut on the proximal tibia  for 2 reasons, one to expose to normal bone and two to allow [place] to put  in a 10-mm spacer when the new tibia was placed.  Using a small  oscillating saw, we then cut the channels for the Delta fit keel.  We  did not have use the boss reamer because of the large cyst and at this  point, it was a nicely exposed and we stripped the membranes out of the  cyst down to the walls of the metaphyseal flare and the bone was quite  thin, only about 1-2 mm in thickness.  At this point, we assembled a  trial with the 12 x 155 stem and a 7 baseplate and inserted this to the  appropriate depth with good fit and fill and performed a trial reduction  with a 10-mm  bearing.  The knee came to full extension and the  collateral ligaments were stable.  We also inserted a #7 trial patellar  button after redrilling the patella and this tracked with very little  thumb pressure, although some was required because of the quadriceps  snip proximally.  At this  point, the trial components were removed.  All  bony surfaces water picked, clean, and dried with suction and sponges.  A double batch of Howmedica simplex cement was mixed at the back table  and applied to the cyst region of the tibia, the proximal bone of the  tibia, the tibial baseplate, and the #7 baseplate with 155 x 12-stem was  inserted and hammered into place and excess cement removed.  The #7  button was then squeezed onto the patella and the cement allowed to cure  as we water picked the wound clean with pulse lavage.  Medium Hemovac  drains were then placed deep in the wound.  The 10-mm bearing was  inserted after removing any excess cement, allowing the cement to cure.  The knee came to full extension, flexed easily to 120 degrees.  The  collateral ligaments were stable.  At this point, we began our closure  in layers using running #1 Vicryl suture in the quadriceps tendon and  parapatellar arthrotomy, 0 and 2-0 Vicryl suture in the subcutaneous  tissue and skin staples.  A dressing of Xeroform, 4x4 dressing sponges,  Webril, and Ace wrap was then applied.  The tourniquet let down.  The  patient was then awakened and taken to the recovery room without  difficulty.     Feliberto Gottron. Turner Daniels, M.D.  Electronically Signed    FJR/MEDQ  D:  06/11/2008  T:  06/12/2008  Job:  [id number]   cc:   Molly Maduro A. Thurston Hole, M.D.

## 2010-06-16 ENCOUNTER — Other Ambulatory Visit: Payer: Self-pay | Admitting: *Deleted

## 2010-06-16 MED ORDER — SOLIFENACIN SUCCINATE 5 MG PO TABS: 10.0000 mg | ORAL_TABLET | Freq: Every day | ORAL | Status: DC

## 2010-06-18 NOTE — Discharge Summary (Signed)
NAMEDEATRICE, Tracy Sharp                   ACCOUNT NO.:  [id number]   MEDICAL RECORD NO.:  [id number]          PATIENT TYPE:  INP   LOCATION:  6531                         FACILITY:  [facility]   PHYSICIAN:  Bevelyn Buckles. Bensimhon, MDDATE OF BIRTH:  March 29, 1924   DATE OF ADMISSION:  11/06/2006  DATE OF DISCHARGE:  11/07/2006                               DISCHARGE SUMMARY   PRIMARY CARDIOLOGIST:  Bevelyn Buckles. Bensimhon, M.D.   PRIMARY CARE PHYSICIAN:  Marne A. Milinda Antis, M.D.   PROCEDURES PERFORMED DURING HOSPITALIZATION:  Cardiac catheterization  completed by Dr. Charlies Constable revealing normal coronary angiography  except for minimal irregularities in the left anterior descending and  normal left ventricular function.  There is no source of ischemia and no  cause identified for patient's recent shortness of breath.   FINAL DISCHARGE DIAGNOSES:  1. Chest pain.  2. Shortness of breath.  3. Deconditioning.  4. Obesity.  5. Hyperlipidemia.   HOSPITAL COURSE:  This is a 77 year old female with morbid obesity, bi-  vesicular block and noncardiac chest pain with prior cardiac  catheterization in 1996 by Dr. Ty Hilts for abnormal EKG and chest pain  in a preoperative work up for a knee replacement showing normal  coronaries.  The patient, again, had a follow up catheterization in 2000  with similar symptoms and was provided reassurance.  The patient saw her  primary care physician, Dr. Milinda Antis, and for nearly a year had history of  progressive dyspnea.  She said it has gotten much worse over the last 2  to 3 months and complains of chest pressure which happens about 2 to 3  times a week with exertion and at rest.  The patient was seen and  examined by Dr. Gala Romney and had complaints of shortness of breath  along with chest fullness and was scheduled for cardiac catheterization  for further evaluation of coronary anatomy with recurrent chest  discomfort.   Cardiac catheterization was completed by Dr. Charlies Constable on November 06, 2006 with results as described above.  Please see Dr. Regino Schultze thorough  cardiac catheterization dictation for more details.  The patient  tolerated the procedure well without evidence of hematoma, bleeding,  infection or pain at the catheterization site, recovered well and was  followed by Dr. Gala Romney post procedure and found to be stable.  The  patient was also given reassurance again by Dr. Gala Romney and felt her  shortness of breath was related to deconditioning and anxiety and she  was to follow up with Dr. Milinda Antis as an outpatient with recommended PFTs  to be completed.   Prior to discharge the patient had an echocardiogram revealing overall  left ventricular systolic function, normal left ventricular ejection  fraction which was estimated at 60%.  There was no diagnostic evidence  of left ventricular regional wall motion abnormality.  Left ventricular  wall thickness is mildly increased.  The mean transthoracic valve  gradient was 10 mmHg.  Estimated aortic valve area (by VTI) was 2.01 cm  squared.  Estimated aortic valve area (by VMAX) was 1.79 cm squared.  There was mild mitral annual calcification.   DISCHARGE VITAL SIGNS:  Blood pressure 105/46, heart rate 73,  respirations 18, temperature 97.7, O2 saturation 97% on room air.   LABORATORY DATA:  Sodium 142, potassium 4, chloride 111, CO2 27, BUN 17,  creatinine 0.6, glucose 92, hemoglobin 12.1, hematocrit 35.6, white  blood cells 5.4, platelets 266, D-dimer 0.43.   DISCHARGE MEDICATIONS:  1. Detrol 4 mg daily.  2. Aspirin 81 mg daily.  3. BuSpar 7.5 mg twice a day.  4. Lasix 20 mg daily.  5. Protonix 20 mg daily.   ALLERGIES:  NO KNOWN DRUG ALLERGIES.   FOLLOWUP PLANS AND APPOINTMENTS:  1. The patient is given post cardiac catheterization instructions with      particular emphasis on the right groin site for evidence of      hematoma, bleeding or infection.  2. The patient is to follow up with  Dr. Milinda Antis within 1 week.  She is      to call to make this follow up appointment for continued medical      management.  3. It is recommended that the patient have PFTs and pulmonary      evaluation for continued dyspnea although this may be related to      obesity and deconditioning.  Evaluation for sleep apnea may also      need to be completed.   Time spent with the patient to include physician time 30 minutes.      Bettey Mare. Lyman Bishop, NP      Bevelyn Buckles. Bensimhon, MD  Electronically Signed    KML/MEDQ  D:  12/04/2006  T:  12/04/2006  Job:  [id number]

## 2010-07-12 ENCOUNTER — Encounter: Payer: Self-pay | Admitting: Internal Medicine

## 2010-07-13 ENCOUNTER — Ambulatory Visit (INDEPENDENT_AMBULATORY_CARE_PROVIDER_SITE_OTHER): Payer: Medicare Other | Admitting: Internal Medicine

## 2010-07-13 ENCOUNTER — Encounter: Payer: Self-pay | Admitting: Internal Medicine

## 2010-07-13 DIAGNOSIS — F411 Generalized anxiety disorder: Secondary | ICD-10-CM

## 2010-07-13 MED ORDER — SERTRALINE HCL 50 MG PO TABS: 50.0000 mg | ORAL_TABLET | Freq: Every day | ORAL | Status: DC

## 2010-07-13 NOTE — Patient Instructions (Addendum)
Anxiety and depression - will switch from BuSpar to sertraline 50mg  once a day. Continue the BuSpar for about 5 days after starting the sertraline and then stop it. Please call in a report in 3-4 weeks as to how you are doing on the low dose of sertraline. It is not uncommon to go to 100mg . Call sooner for problems.  Please schedule an annual exam in November at which time we will get routine lab work.  Thanks for coming back to see me.

## 2010-07-14 NOTE — Assessment & Plan Note (Signed)
Discussed medications options and medication safety/side effect profiles  Plan - D/C BuSpar           Start Sertraline 50mg  qd. She is to report back in 2-3 weeks as to effectiveness. Likely to increase dose to 100mg  if not doing really well.

## 2010-07-14 NOTE — Progress Notes (Signed)
  Subjective:    Patient ID: Tracy Sharp, female    DOB: [birth year], 77 y.o.   MRN: [id number]  HPI Tracy Sharp was recently seen as a new patient. She returns for follow-up. She and her family have reviewed the original note and have no additions or corrections.  In the month's interval she has been stable. She did celebrate her 63rd wedding anniversary. She has considered the offer of changing medications from BuSpar to SSRI for management of anxiety and depression.  PMH, FamHx and SocHx reviewed for any changes and relevance.    Review of Systems  Constitutional: Negative.   HENT: Negative.   Respiratory: Negative.   Cardiovascular: Negative.   Musculoskeletal: Negative.   Neurological: Negative.   Psychiatric/Behavioral: Positive for dysphoric mood.       Anxious       Objective:   Physical Exam Vitals reviewed Gen'l- overweight elderly white woman in no distress Pul - normal respirations, clear lungs Cor - RRR Psych - calm, pleasant affect, cooperative       Assessment & Plan:

## 2010-08-28 ENCOUNTER — Emergency Department (HOSPITAL_COMMUNITY)
Admission: EM | Admit: 2010-08-28 | Discharge: 2010-08-28 | Disposition: A | Discharge status: Home / Self Care | Payer: Medicare Other | Attending: Emergency Medicine | Admitting: Emergency Medicine

## 2010-08-28 ENCOUNTER — Emergency Department (HOSPITAL_COMMUNITY): Payer: Medicare Other

## 2010-08-28 DIAGNOSIS — M25559 Pain in unspecified hip: Secondary | ICD-10-CM | POA: Insufficient documentation

## 2010-08-28 DIAGNOSIS — M25539 Pain in unspecified wrist: Secondary | ICD-10-CM | POA: Insufficient documentation

## 2010-08-28 DIAGNOSIS — M129 Arthropathy, unspecified: Secondary | ICD-10-CM | POA: Insufficient documentation

## 2010-08-28 DIAGNOSIS — R071 Chest pain on breathing: Secondary | ICD-10-CM | POA: Insufficient documentation

## 2010-08-28 DIAGNOSIS — M810 Age-related osteoporosis without current pathological fracture: Secondary | ICD-10-CM | POA: Insufficient documentation

## 2010-08-28 DIAGNOSIS — W19XXXA Unspecified fall, initial encounter: Secondary | ICD-10-CM | POA: Insufficient documentation

## 2010-08-28 DIAGNOSIS — Z79899 Other long term (current) drug therapy: Secondary | ICD-10-CM | POA: Insufficient documentation

## 2010-08-28 DIAGNOSIS — Y92009 Unspecified place in unspecified non-institutional (private) residence as the place of occurrence of the external cause: Secondary | ICD-10-CM | POA: Insufficient documentation

## 2010-08-28 LAB — POCT I-STAT, CHEM 8
Creatinine, Ser: 0.7 mg/dL (ref 0.50–1.10)
HCT: 38 % (ref 36.0–46.0)
Hemoglobin: 12.9 g/dL (ref 12.0–15.0)
Potassium: 4.2 mEq/L (ref 3.5–5.1)
Sodium: 139 mEq/L (ref 135–145)

## 2010-09-03 ENCOUNTER — Emergency Department (HOSPITAL_COMMUNITY)
Admission: EM | Admit: 2010-09-03 | Discharge: 2010-09-04 | Disposition: A | Discharge status: Home / Self Care | Payer: Medicare Other | Attending: Emergency Medicine | Admitting: Emergency Medicine

## 2010-09-03 ENCOUNTER — Telehealth: Payer: Self-pay | Admitting: *Deleted

## 2010-09-03 ENCOUNTER — Emergency Department (HOSPITAL_COMMUNITY): Payer: Medicare Other

## 2010-09-03 DIAGNOSIS — K429 Umbilical hernia without obstruction or gangrene: Secondary | ICD-10-CM | POA: Insufficient documentation

## 2010-09-03 DIAGNOSIS — M549 Dorsalgia, unspecified: Secondary | ICD-10-CM | POA: Insufficient documentation

## 2010-09-03 DIAGNOSIS — K5900 Constipation, unspecified: Secondary | ICD-10-CM | POA: Insufficient documentation

## 2010-09-03 DIAGNOSIS — M129 Arthropathy, unspecified: Secondary | ICD-10-CM | POA: Insufficient documentation

## 2010-09-03 DIAGNOSIS — R630 Anorexia: Secondary | ICD-10-CM | POA: Insufficient documentation

## 2010-09-03 DIAGNOSIS — M810 Age-related osteoporosis without current pathological fracture: Secondary | ICD-10-CM | POA: Insufficient documentation

## 2010-09-03 DIAGNOSIS — R109 Unspecified abdominal pain: Secondary | ICD-10-CM | POA: Insufficient documentation

## 2010-09-03 DIAGNOSIS — Z79899 Other long term (current) drug therapy: Secondary | ICD-10-CM | POA: Insufficient documentation

## 2010-09-03 LAB — URINALYSIS, ROUTINE W REFLEX MICROSCOPIC
Glucose, UA: NEGATIVE mg/dL
Leukocytes, UA: NEGATIVE
Nitrite: NEGATIVE
Specific Gravity, Urine: 1.018 (ref 1.005–1.030)
pH: 7.5 (ref 5.0–8.0)

## 2010-09-03 LAB — POCT I-STAT, CHEM 8
BUN: 26 mg/dL — ABNORMAL HIGH (ref 6–23)
Chloride: 107 mEq/L (ref 96–112)
HCT: 38 % (ref 36.0–46.0)
Sodium: 141 mEq/L (ref 135–145)

## 2010-09-03 LAB — CBC
Hemoglobin: 12.4 g/dL (ref 12.0–15.0)
RBC: 4.17 MIL/uL (ref 3.87–5.11)
WBC: 6.4 10*3/uL (ref 4.0–10.5)

## 2010-09-03 NOTE — Telephone Encounter (Signed)
Family member left VM today - Pt c/o pain above navel and there is a 3 inch space that is very painful to touch - symptoms became severe today around 12, she left VM at 4:59. I attempted to call and get more info - no answer. I left vm to call and discuss symptoms w/on call nurses to determine if pt needed ER eval - if ER was not needed advised they call Sat AM and schedule apt for eval.

## 2010-09-03 NOTE — Telephone Encounter (Signed)
Agree with advice and disposition

## 2010-09-04 ENCOUNTER — Encounter (HOSPITAL_COMMUNITY): Payer: Self-pay | Admitting: Radiology

## 2010-09-04 MED ORDER — IOHEXOL 300 MG/ML  SOLN
80.0000 mL | Freq: Once | INTRAMUSCULAR | Status: AC | PRN
  Administered 2010-09-04: 80 mL via INTRAVENOUS

## 2010-09-06 ENCOUNTER — Telehealth: Payer: Self-pay

## 2010-09-06 DIAGNOSIS — K469 Unspecified abdominal hernia without obstruction or gangrene: Secondary | ICD-10-CM

## 2010-09-06 NOTE — Telephone Encounter (Signed)
[facility] Triage Call Report Triage Record Num: [id number] Operator: Albertine Grates Patient Name: Tracy Sharp Call Date & Time: 09/03/2010 6:14:00PM Patient Phone: [phone] PCP: Illene Regulus Patient Gender: Female PCP Fax : [phone] Patient DOB: [birth year] Practice Name: [facility] Reason for Call: Daughter in Law/[name] calling and states pt. has complained with abdominal pain since 8-2. Has been intermittent. Pain is "above navel". Has hernia that bulges at this area but has gotten larger. Is 3 inches in diameter. Took Percocet and has helped some with pain. MD has not evaluated hernia. Advised ED. Protocol(s) Used: Abdominal Pain Recommended Outcome per Protocol: See ED Immediately Reason for Outcome: Unable to reduce diagnosed hernia AND has severe pain, nausea/vomiting or any fever Care Advice: ~ 09/03/2010 6:26:47PM Page 1 of 1 [id number]

## 2010-09-06 NOTE — Telephone Encounter (Signed)
Put in request to [facility] for referral to [facility]

## 2010-09-06 NOTE — Telephone Encounter (Signed)
Spoke w/daughter. Pt is uncomfortable - ER says pt has 2 clips (from previous gallbladder surgery) that have "migrated" and are involved in the hernia that needs to be repaired. Pt already has apt w/surgeon on 8/20 but is uncomfortable. What you do suggest?

## 2010-09-06 NOTE — Telephone Encounter (Signed)
Daughter called, pt was seen in the ER - they advised she see PCP then get referral to surgeon for hernia. Daughter wants to know if pt can get referral w/o seeing PCP?   [phone] Kendal Hymen dtr-in-law)

## 2010-09-06 NOTE — Telephone Encounter (Signed)
Call [facility] tomorrow and set up same day walk in appointment with the doctor of the day for painful hernia

## 2010-09-07 NOTE — Telephone Encounter (Signed)
Kendal Hymen informed of apt.

## 2010-09-07 NOTE — Telephone Encounter (Signed)
Spoke w/[facility] - Pt has apt tomorrow at 4:30 (arrive 15 min early) with Dr Adriana Mccallum. Attempted to call daughter to inform - bad connection, will try again later.

## 2010-09-08 ENCOUNTER — Ambulatory Visit (INDEPENDENT_AMBULATORY_CARE_PROVIDER_SITE_OTHER): Payer: Medicare Other | Admitting: General Surgery

## 2010-09-08 ENCOUNTER — Encounter (INDEPENDENT_AMBULATORY_CARE_PROVIDER_SITE_OTHER): Payer: Self-pay | Admitting: General Surgery

## 2010-09-08 VITALS — BP 138/68 | HR 72 | Temp 98.8°F

## 2010-09-08 DIAGNOSIS — K432 Incisional hernia without obstruction or gangrene: Secondary | ICD-10-CM

## 2010-09-08 NOTE — Progress Notes (Signed)
Subjective:   Hernia, abdominal pain.  Patient ID: Tracy Sharp, female   DOB: 1924-06-07, 77 y.o.   MRN: [id number]  HPI Patient is an 77 year old female referred due to abdominal pain and an apparent hernia. She has known that she has had a bulge around her umbilicus for quite some time in recent weeks however it has become progressively more painful. This required a visit to the emergency room last week. A CT scan was performed which I have reviewed. This shows a fat-containing hernia at the umbilicus. No bowel involvement. Also noted is a previous surgical clip from laparoscopic cholecystectomy within the hernia content. The patient has had some episodic nausea for a long time related to anxiety and this is unchanged.  no other acute GI symptoms.  Review of Systems  HENT: Positive for hearing loss.   Respiratory: Negative for shortness of breath, wheezing and stridor.   Cardiovascular: Negative for chest pain and palpitations.  Neurological: Positive for dizziness and light-headedness.       Objective:   Physical Exam General: Elderly white female in no acute distress Skin: Atrophic. No rash or infection HEENT: Dentures. No palpable masses. Sclera nonicteric. Lungs: Clear without wheezing or increased work of breathing Cardiovascular: Regular rate and rhythm. No murmurs or edema. Abdomen: Nondistended. Low midline incision up to the umbilicus. There is a good sized mass at the umbilicus, about 5 or 6 cm, somewhat tender, consistent with a chronically incarcerated hernia. This may well be an incisional hernia from her previous hysterectomy or cholecystectomy. Also noted is a 2-3 cm tender mass at her epigastric port site. This is somewhat tender. Extremities: No swelling or joint deformity Neurologic: Hard of hearing. Gait unsteady.   Assessment:     Incarcerated umbilical or incisional hernia containing omentum. She has ongoing and increasing symptoms but not any evidence of  strangulation. She also has a small hernia at her epigastric port site. This certainly needs to be repaired due to ongoing symptoms and risk of strangulation.    Plan:    We will plan open repair of her incarcerated hernia and will repair the epigastric hernia at the same time. This will require general anesthesia. I discussed the nature of the procedure and indications as well as possible risks of anesthetic complications, bleeding, infection, and recurrence, with the patient and her daughter. We will schedule this within the next few days due to her ongoing symptoms appear

## 2010-09-08 NOTE — Patient Instructions (Signed)
Call for questions about surgery scheduling.

## 2010-09-09 ENCOUNTER — Ambulatory Visit: Payer: BLUE CROSS/BLUE SHIELD | Admitting: Internal Medicine

## 2010-09-09 ENCOUNTER — Other Ambulatory Visit: Payer: Self-pay | Admitting: *Deleted

## 2010-09-10 ENCOUNTER — Encounter (HOSPITAL_COMMUNITY): Payer: Medicare Other

## 2010-09-10 ENCOUNTER — Other Ambulatory Visit (INDEPENDENT_AMBULATORY_CARE_PROVIDER_SITE_OTHER): Payer: Self-pay | Admitting: General Surgery

## 2010-09-10 LAB — SURGICAL PCR SCREEN: MRSA, PCR: NEGATIVE

## 2010-09-12 NOTE — Telephone Encounter (Signed)
Ok for refills

## 2010-09-13 ENCOUNTER — Other Ambulatory Visit: Payer: Self-pay | Admitting: *Deleted

## 2010-09-13 ENCOUNTER — Other Ambulatory Visit (INDEPENDENT_AMBULATORY_CARE_PROVIDER_SITE_OTHER): Payer: Self-pay | Admitting: General Surgery

## 2010-09-13 ENCOUNTER — Ambulatory Visit (HOSPITAL_COMMUNITY)
Admission: RE | Admit: 2010-09-13 | Discharge: 2010-09-14 | Disposition: A | Discharge status: Home / Self Care | Payer: Medicare Other | Source: Ambulatory Visit | Attending: General Surgery | Admitting: General Surgery

## 2010-09-13 DIAGNOSIS — K420 Umbilical hernia with obstruction, without gangrene: Secondary | ICD-10-CM

## 2010-09-13 DIAGNOSIS — K436 Other and unspecified ventral hernia with obstruction, without gangrene: Secondary | ICD-10-CM

## 2010-09-13 DIAGNOSIS — R9431 Abnormal electrocardiogram [ECG] [EKG]: Secondary | ICD-10-CM | POA: Insufficient documentation

## 2010-09-13 DIAGNOSIS — K219 Gastro-esophageal reflux disease without esophagitis: Secondary | ICD-10-CM | POA: Insufficient documentation

## 2010-09-13 DIAGNOSIS — Z79899 Other long term (current) drug therapy: Secondary | ICD-10-CM | POA: Insufficient documentation

## 2010-09-13 DIAGNOSIS — K429 Umbilical hernia without obstruction or gangrene: Secondary | ICD-10-CM | POA: Insufficient documentation

## 2010-09-13 DIAGNOSIS — Z01812 Encounter for preprocedural laboratory examination: Secondary | ICD-10-CM | POA: Insufficient documentation

## 2010-09-13 DIAGNOSIS — Z01810 Encounter for preprocedural cardiovascular examination: Secondary | ICD-10-CM | POA: Insufficient documentation

## 2010-09-13 DIAGNOSIS — K439 Ventral hernia without obstruction or gangrene: Principal | ICD-10-CM | POA: Insufficient documentation

## 2010-09-13 MED ORDER — SOLIFENACIN SUCCINATE 5 MG PO TABS: 10.0000 mg | ORAL_TABLET | Freq: Every day | ORAL | Status: DC

## 2010-09-13 NOTE — Telephone Encounter (Signed)
Ok prn 

## 2010-09-15 NOTE — Telephone Encounter (Signed)
k

## 2010-09-20 ENCOUNTER — Ambulatory Visit (INDEPENDENT_AMBULATORY_CARE_PROVIDER_SITE_OTHER): Payer: Self-pay | Admitting: General Surgery

## 2010-09-27 NOTE — Op Note (Signed)
Tracy Sharp, Tracy Sharp                   ACCOUNT NO.:  [id number]  MEDICAL RECORD NO.:  [id number]  LOCATION:  1532                         FACILITY:  [facility]  PHYSICIAN:  Sharlet Salina T. Mordechai Matuszak, M.D.DATE OF BIRTH:  1924/12/23  DATE OF PROCEDURE:  09/13/2010 DATE OF DISCHARGE:                              OPERATIVE REPORT   PREOPERATIVE DIAGNOSIS:  Ventral hernia x2.  POSTOPERATIVE DIAGNOSIS:  Ventral hernia x2.  SURGICAL PROCEDURE: 1. Open repair of incarcerated umbilical hernia with mesh. 2. Open repair primarily of epigastric trocar site hernia.  SURGEON:  Lorne Skeens. Torrion Witter, M.D.  ANESTHESIA:  General.  BRIEF HISTORY:  This patient is an 77 year old female who has noted a bulge around her umbilicus for many months.  Progressively, has become more painful and enlarged.  She presented to the emergency room and a CT scan was performed showing a fat containing hernia at the umbilicus without bowel involvement.  She was referred to the office urgently and examination revealed a tender 5 or 6 cm mass at the umbilicus, which was at the top of a previous hysterectomy incision.  There was also a tender several centimeter mass in the epigastrium, had a previous port site for laparoscopic cholecystectomy.  CT was reviewed showing hernias in both areas.  I have recommended repair of both areas.  We discussed the procedure including expected recovery, use of mesh, risk of anesthetic complications, bleeding, infection, recurrence.  She now brought to operating room for these procedures.  DESCRIPTION OF OPERATION:  The patient was brought to the operating room, placed in the supine position on the operate table and general endotracheal anesthesia was induced.  Foley cath was placed and the abdomen was widely sterilely prepped and draped.  She received preoperative IV antibiotics.  PAS were in place.  Correct patient procedure were verified.  I made a longitudinal incision skirting  the umbilicus and used a portion of the upper part of the midline incision. Dissection was carried down through the subcutaneous tissue.  A large hernia sac was encountered.  This was dissected away from surrounding subcutaneous tissue.  The umbilical skin was densely adherent to the hernia sac.  As I dissected the umbilical skin off the sac, we entered a fairly large sebaceous cyst.  It did not appear infected.  This was right up behind the base of the umbilical skin, which was very thin.  I felt the best way to handle this in terms of best chance for wound healing was to completely excise the umbilicus along with the cyst. This was done sharply and the entire cyst and umbilical skin completely excised.  The hernia sac was then further mobilized from surrounding tissue down to the level of the fascia.  The defect here was fairly small.  I opened the hernia sac and it contained chronically incarcerated omentum.  There were dense adhesions between the end of the omentum and this could not be reduced without opening the defects significantly and therefore I resected a portion of the distal omentum where there were multiple adhesions, dividing this between clamps and tying with 3-0 Vicryl ties.  This was sent for permanent pathology. Then, the defect was  enlarged just about a few millimeters superiorly, which allowed the rest of the omentum to be reduced into the abdominal cavity.  I then felt through the defect in the fashion, all directions felt very solid, and there were no further adhesions.  The defect itself measured just about a centimeter and a half and I think it was most consistent with a primary umbilical hernia rather than incisional hernia.  I elected to repair this with a 6.5 cm ventral patch, a piece of mesh.  This was deployed into the peritoneal cavity and then brought up to the anterior abdominal wall and made sure it was smoothly out in all directions up against the  anterior abdominal wall, which provided about 3 cm coverage in all directions.  I then closed the fascial defect longitudinally with interrupted 0-Novafil sutures, incorporating the tags of the ventral patch in the closure and the tags were trimmed away. The fascia of this area appeared of good quality.  The wound was irrigated.  Exparel local anesthetic was injected into the fascia and the surrounding subcutaneous tissue.  This wound was then packed with a moist lap while we directed our attention to the epigastric hernia.  I made a small transverse incision at the previous epigastric trocar site, dissection was carried down to the subcutaneous tissue.  The hernia sac was encountered, dissect away from surrounding subcutaneous tissue down to the level of the fascia.  The hernia sac was excised to the level of the fascia.  Hernia contained a small amount of omentum or preperitoneal fat that was reduced.  This was a small transverse defect just about a centimeter in length.  I closed this primarily transversely with several interrupted 0 Novafil sutures.  Soft tissue was also then irrigated and infiltrated with Exparel .  The subcutaneous tissue of the larger incision was closed with running 3-0 Monocryl and skin closed in both incisions with staples.  Sponge and needle counts were correct.  The patient was taken to the recovery room in good condition.     Lorne Skeens. Trayce Maino, M.D.     Tory Emerald  D:  09/13/2010  T:  09/14/2010  Job:  [id number]  Electronically Signed by Glenna Fellows M.D. on 09/27/2010 11:22:25 AM

## 2010-10-08 ENCOUNTER — Ambulatory Visit (INDEPENDENT_AMBULATORY_CARE_PROVIDER_SITE_OTHER): Payer: Medicare Other | Admitting: General Surgery

## 2010-10-08 VITALS — BP 128/60 | HR 66

## 2010-10-08 DIAGNOSIS — Z9889 Other specified postprocedural states: Secondary | ICD-10-CM

## 2010-10-08 NOTE — Patient Instructions (Signed)
Call as needed for concerns. 

## 2010-10-08 NOTE — Progress Notes (Signed)
Patient returns approximately 3 weeks after repair of her chronically incarcerated periumbilical hernia and small epigastric trocar site hernia. She is getting along quite well. She now has minimal discomfort. She is getting up and around without much difficulty.  On exam she looks well. Her wounds are nicely healed and I removed the staples. She does have an area of monilial rash on her pannus just below the incision. I gave her a prescription for nystatin powder for this.  She is doing very well postoperatively without complication. She will return in 4 weeks for what should be a final check.

## 2010-11-11 LAB — CBC
MCHC: 34.2
Platelets: 266
RDW: 12.9
RDW: 13.3
WBC: 5.4

## 2010-11-11 LAB — COMPREHENSIVE METABOLIC PANEL
Alkaline Phosphatase: 66
BUN: 19
Calcium: 9.2
Glucose, Bld: 92
Potassium: 3.8
Total Protein: 6.3

## 2010-11-11 LAB — POCT I-STAT 3, VENOUS BLOOD GAS (G3P V)
Acid-base deficit: 2
Bicarbonate: 23.6
O2 Saturation: 67
Operator id: 142231

## 2010-11-11 LAB — CK TOTAL AND CKMB (NOT AT ARMC)
Relative Index: INVALID
Total CK: 69

## 2010-11-11 LAB — D-DIMER, QUANTITATIVE
D-Dimer, Quant: 0.43
D-Dimer, Quant: 0.47

## 2010-11-11 LAB — BASIC METABOLIC PANEL
BUN: 17
Calcium: 8.9
Creatinine, Ser: 0.63
GFR calc non Af Amer: >60
Potassium: 4

## 2010-11-11 LAB — POCT I-STAT 3, ART BLOOD GAS (G3+)
Acid-base deficit: 2
O2 Saturation: 94
TCO2: 24
pH, Arterial: 7.385

## 2010-11-11 LAB — TROPONIN I: Troponin I: 0.21 — ABNORMAL HIGH

## 2010-11-12 ENCOUNTER — Encounter (INDEPENDENT_AMBULATORY_CARE_PROVIDER_SITE_OTHER): Payer: Medicare Other | Admitting: General Surgery

## 2010-12-10 ENCOUNTER — Encounter (INDEPENDENT_AMBULATORY_CARE_PROVIDER_SITE_OTHER): Payer: Medicare Other | Admitting: General Surgery

## 2010-12-28 ENCOUNTER — Other Ambulatory Visit: Payer: Self-pay | Admitting: Internal Medicine

## 2011-01-04 ENCOUNTER — Other Ambulatory Visit: Payer: Self-pay | Admitting: Internal Medicine

## 2011-01-05 ENCOUNTER — Ambulatory Visit (INDEPENDENT_AMBULATORY_CARE_PROVIDER_SITE_OTHER): Payer: Medicare Other | Admitting: Internal Medicine

## 2011-01-05 DIAGNOSIS — N3946 Mixed incontinence: Secondary | ICD-10-CM

## 2011-01-05 NOTE — Patient Instructions (Signed)
Urinary frequency - if the Detrol LA and the Vesicare have not made any difference in urinary frequency it is most likely NOT irritable bladder.  Plan - stop the lasix (furosemide) for 10 days.           If this makes a big difference in urinary frequency we will use different medication to control blood pressure           If this does NOT make a big difference in urinary frequency you will need to be evaluated in the bladder function clinic at [facility]
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
## 2011-01-06 NOTE — Assessment & Plan Note (Signed)
Main problem is urinary frequency/nocturia. Irritable bladder unlikely with no response to anticholinergic drugs.  Plan - d/c vesicare           drug holiday from furosemide -  If no reduction in frequency/nocturia will refer to Dr. Isabel Caprice

## 2011-02-08 ENCOUNTER — Telehealth: Payer: Self-pay | Admitting: Internal Medicine

## 2011-02-08 DIAGNOSIS — H169 Unspecified keratitis: Secondary | ICD-10-CM | POA: Diagnosis not present

## 2011-02-08 NOTE — Telephone Encounter (Signed)
The pt called and is requesting a urology ref.    Thanks!

## 2011-03-22 DIAGNOSIS — H10509 Unspecified blepharoconjunctivitis, unspecified eye: Secondary | ICD-10-CM | POA: Diagnosis not present

## 2011-04-08 DIAGNOSIS — H0279 Other degenerative disorders of eyelid and periocular area: Secondary | ICD-10-CM | POA: Diagnosis not present

## 2011-04-21 DIAGNOSIS — M1710 Unilateral primary osteoarthritis, unspecified knee: Secondary | ICD-10-CM | POA: Diagnosis not present

## 2011-05-06 DIAGNOSIS — H169 Unspecified keratitis: Secondary | ICD-10-CM | POA: Diagnosis not present

## 2011-05-16 DIAGNOSIS — N3946 Mixed incontinence: Secondary | ICD-10-CM | POA: Diagnosis not present

## 2011-05-16 DIAGNOSIS — N3944 Nocturnal enuresis: Secondary | ICD-10-CM | POA: Diagnosis not present

## 2011-05-20 DIAGNOSIS — H169 Unspecified keratitis: Secondary | ICD-10-CM | POA: Diagnosis not present

## 2011-05-22 DIAGNOSIS — IMO0001 Reserved for inherently not codable concepts without codable children: Secondary | ICD-10-CM | POA: Diagnosis not present

## 2011-05-22 DIAGNOSIS — Z96659 Presence of unspecified artificial knee joint: Secondary | ICD-10-CM | POA: Diagnosis not present

## 2011-05-22 DIAGNOSIS — M1710 Unilateral primary osteoarthritis, unspecified knee: Secondary | ICD-10-CM | POA: Diagnosis not present

## 2011-05-22 DIAGNOSIS — G8929 Other chronic pain: Secondary | ICD-10-CM | POA: Diagnosis not present

## 2011-05-22 DIAGNOSIS — T8489XA Other specified complication of internal orthopedic prosthetic devices, implants and grafts, initial encounter: Secondary | ICD-10-CM | POA: Diagnosis not present

## 2011-05-22 DIAGNOSIS — Z9181 History of falling: Secondary | ICD-10-CM | POA: Diagnosis not present

## 2011-05-24 DIAGNOSIS — T8489XA Other specified complication of internal orthopedic prosthetic devices, implants and grafts, initial encounter: Secondary | ICD-10-CM | POA: Diagnosis not present

## 2011-05-24 DIAGNOSIS — IMO0001 Reserved for inherently not codable concepts without codable children: Secondary | ICD-10-CM | POA: Diagnosis not present

## 2011-05-24 DIAGNOSIS — G8929 Other chronic pain: Secondary | ICD-10-CM | POA: Diagnosis not present

## 2011-05-24 DIAGNOSIS — Z9181 History of falling: Secondary | ICD-10-CM | POA: Diagnosis not present

## 2011-05-24 DIAGNOSIS — Z96659 Presence of unspecified artificial knee joint: Secondary | ICD-10-CM | POA: Diagnosis not present

## 2011-05-26 DIAGNOSIS — Z9181 History of falling: Secondary | ICD-10-CM | POA: Diagnosis not present

## 2011-05-26 DIAGNOSIS — T8489XA Other specified complication of internal orthopedic prosthetic devices, implants and grafts, initial encounter: Secondary | ICD-10-CM | POA: Diagnosis not present

## 2011-05-26 DIAGNOSIS — G8929 Other chronic pain: Secondary | ICD-10-CM | POA: Diagnosis not present

## 2011-05-26 DIAGNOSIS — Z96659 Presence of unspecified artificial knee joint: Secondary | ICD-10-CM | POA: Diagnosis not present

## 2011-05-26 DIAGNOSIS — IMO0001 Reserved for inherently not codable concepts without codable children: Secondary | ICD-10-CM | POA: Diagnosis not present

## 2011-05-31 DIAGNOSIS — Z9181 History of falling: Secondary | ICD-10-CM | POA: Diagnosis not present

## 2011-05-31 DIAGNOSIS — G8929 Other chronic pain: Secondary | ICD-10-CM | POA: Diagnosis not present

## 2011-05-31 DIAGNOSIS — T8489XA Other specified complication of internal orthopedic prosthetic devices, implants and grafts, initial encounter: Secondary | ICD-10-CM | POA: Diagnosis not present

## 2011-05-31 DIAGNOSIS — IMO0001 Reserved for inherently not codable concepts without codable children: Secondary | ICD-10-CM | POA: Diagnosis not present

## 2011-05-31 DIAGNOSIS — Z96659 Presence of unspecified artificial knee joint: Secondary | ICD-10-CM | POA: Diagnosis not present

## 2011-06-02 DIAGNOSIS — B351 Tinea unguium: Secondary | ICD-10-CM | POA: Diagnosis not present

## 2011-06-02 DIAGNOSIS — M79609 Pain in unspecified limb: Secondary | ICD-10-CM | POA: Diagnosis not present

## 2011-06-02 DIAGNOSIS — L97509 Non-pressure chronic ulcer of other part of unspecified foot with unspecified severity: Secondary | ICD-10-CM | POA: Diagnosis not present

## 2011-06-02 DIAGNOSIS — H169 Unspecified keratitis: Secondary | ICD-10-CM | POA: Diagnosis not present

## 2011-06-02 DIAGNOSIS — M2040 Other hammer toe(s) (acquired), unspecified foot: Secondary | ICD-10-CM | POA: Diagnosis not present

## 2011-06-03 DIAGNOSIS — G8929 Other chronic pain: Secondary | ICD-10-CM | POA: Diagnosis not present

## 2011-06-03 DIAGNOSIS — Z96659 Presence of unspecified artificial knee joint: Secondary | ICD-10-CM | POA: Diagnosis not present

## 2011-06-03 DIAGNOSIS — Z9181 History of falling: Secondary | ICD-10-CM | POA: Diagnosis not present

## 2011-06-03 DIAGNOSIS — IMO0001 Reserved for inherently not codable concepts without codable children: Secondary | ICD-10-CM | POA: Diagnosis not present

## 2011-06-03 DIAGNOSIS — T8489XA Other specified complication of internal orthopedic prosthetic devices, implants and grafts, initial encounter: Secondary | ICD-10-CM | POA: Diagnosis not present

## 2011-06-07 DIAGNOSIS — Z96659 Presence of unspecified artificial knee joint: Secondary | ICD-10-CM | POA: Diagnosis not present

## 2011-06-07 DIAGNOSIS — T8489XA Other specified complication of internal orthopedic prosthetic devices, implants and grafts, initial encounter: Secondary | ICD-10-CM | POA: Diagnosis not present

## 2011-06-07 DIAGNOSIS — G8929 Other chronic pain: Secondary | ICD-10-CM | POA: Diagnosis not present

## 2011-06-07 DIAGNOSIS — Z9181 History of falling: Secondary | ICD-10-CM | POA: Diagnosis not present

## 2011-06-07 DIAGNOSIS — IMO0001 Reserved for inherently not codable concepts without codable children: Secondary | ICD-10-CM | POA: Diagnosis not present

## 2011-06-09 DIAGNOSIS — G8929 Other chronic pain: Secondary | ICD-10-CM | POA: Diagnosis not present

## 2011-06-09 DIAGNOSIS — Z9181 History of falling: Secondary | ICD-10-CM | POA: Diagnosis not present

## 2011-06-09 DIAGNOSIS — IMO0001 Reserved for inherently not codable concepts without codable children: Secondary | ICD-10-CM | POA: Diagnosis not present

## 2011-06-09 DIAGNOSIS — T8489XA Other specified complication of internal orthopedic prosthetic devices, implants and grafts, initial encounter: Secondary | ICD-10-CM | POA: Diagnosis not present

## 2011-06-09 DIAGNOSIS — Z96659 Presence of unspecified artificial knee joint: Secondary | ICD-10-CM | POA: Diagnosis not present

## 2011-06-14 DIAGNOSIS — Z96659 Presence of unspecified artificial knee joint: Secondary | ICD-10-CM | POA: Diagnosis not present

## 2011-06-14 DIAGNOSIS — Z9181 History of falling: Secondary | ICD-10-CM | POA: Diagnosis not present

## 2011-06-14 DIAGNOSIS — IMO0001 Reserved for inherently not codable concepts without codable children: Secondary | ICD-10-CM | POA: Diagnosis not present

## 2011-06-14 DIAGNOSIS — T8489XA Other specified complication of internal orthopedic prosthetic devices, implants and grafts, initial encounter: Secondary | ICD-10-CM | POA: Diagnosis not present

## 2011-06-14 DIAGNOSIS — G8929 Other chronic pain: Secondary | ICD-10-CM | POA: Diagnosis not present

## 2011-06-15 ENCOUNTER — Telehealth: Payer: Self-pay | Admitting: Internal Medicine

## 2011-06-15 DIAGNOSIS — F411 Generalized anxiety disorder: Secondary | ICD-10-CM

## 2011-06-15 DIAGNOSIS — E663 Overweight: Secondary | ICD-10-CM

## 2011-06-15 DIAGNOSIS — R269 Unspecified abnormalities of gait and mobility: Secondary | ICD-10-CM

## 2011-06-15 DIAGNOSIS — I2510 Atherosclerotic heart disease of native coronary artery without angina pectoris: Secondary | ICD-10-CM

## 2011-06-15 DIAGNOSIS — M25519 Pain in unspecified shoulder: Secondary | ICD-10-CM

## 2011-06-15 NOTE — Telephone Encounter (Signed)
Tracy Sharp is requesting home health for Tracy Sharp and Tracy Sharp.  Tracy Sharp is having problems fixing meals that Tracy Sharp can have with his diabetes.  They need help taking their medicine.  She can't hear well and can't hear when Tracy Sharp is having problems breathing.  She would like home health to come in 4-5 hours each day to help get them started with the day.

## 2011-06-15 NOTE — Telephone Encounter (Signed)
Will put an order in for [facility] to do an assessment of needs and services.

## 2011-06-16 DIAGNOSIS — H35389 Toxic maculopathy, unspecified eye: Secondary | ICD-10-CM | POA: Diagnosis not present

## 2011-06-16 NOTE — Telephone Encounter (Signed)
Tracy Sharp is aware

## 2011-06-17 DIAGNOSIS — G8929 Other chronic pain: Secondary | ICD-10-CM | POA: Diagnosis not present

## 2011-06-17 DIAGNOSIS — IMO0001 Reserved for inherently not codable concepts without codable children: Secondary | ICD-10-CM | POA: Diagnosis not present

## 2011-06-17 DIAGNOSIS — Z9181 History of falling: Secondary | ICD-10-CM | POA: Diagnosis not present

## 2011-06-17 DIAGNOSIS — Z96659 Presence of unspecified artificial knee joint: Secondary | ICD-10-CM | POA: Diagnosis not present

## 2011-06-17 DIAGNOSIS — T8489XA Other specified complication of internal orthopedic prosthetic devices, implants and grafts, initial encounter: Secondary | ICD-10-CM | POA: Diagnosis not present

## 2011-06-20 DIAGNOSIS — Z9181 History of falling: Secondary | ICD-10-CM | POA: Diagnosis not present

## 2011-06-20 DIAGNOSIS — IMO0001 Reserved for inherently not codable concepts without codable children: Secondary | ICD-10-CM | POA: Diagnosis not present

## 2011-06-20 DIAGNOSIS — Z96659 Presence of unspecified artificial knee joint: Secondary | ICD-10-CM | POA: Diagnosis not present

## 2011-06-20 DIAGNOSIS — G8929 Other chronic pain: Secondary | ICD-10-CM | POA: Diagnosis not present

## 2011-06-20 DIAGNOSIS — T8489XA Other specified complication of internal orthopedic prosthetic devices, implants and grafts, initial encounter: Secondary | ICD-10-CM | POA: Diagnosis not present

## 2011-06-27 ENCOUNTER — Other Ambulatory Visit: Payer: Self-pay | Admitting: Internal Medicine

## 2011-06-28 NOTE — Telephone Encounter (Signed)
Refill sent to [facility]. 

## 2011-06-29 DIAGNOSIS — M2040 Other hammer toe(s) (acquired), unspecified foot: Secondary | ICD-10-CM | POA: Diagnosis not present

## 2011-06-29 DIAGNOSIS — L97509 Non-pressure chronic ulcer of other part of unspecified foot with unspecified severity: Secondary | ICD-10-CM | POA: Diagnosis not present

## 2011-06-30 DIAGNOSIS — N3944 Nocturnal enuresis: Secondary | ICD-10-CM | POA: Diagnosis not present

## 2011-06-30 DIAGNOSIS — N3946 Mixed incontinence: Secondary | ICD-10-CM | POA: Diagnosis not present

## 2011-07-06 DIAGNOSIS — N3946 Mixed incontinence: Secondary | ICD-10-CM | POA: Diagnosis not present

## 2011-07-06 DIAGNOSIS — N3944 Nocturnal enuresis: Secondary | ICD-10-CM | POA: Diagnosis not present

## 2011-07-14 DIAGNOSIS — M2550 Pain in unspecified joint: Secondary | ICD-10-CM | POA: Diagnosis not present

## 2011-07-14 DIAGNOSIS — M1990 Unspecified osteoarthritis, unspecified site: Secondary | ICD-10-CM | POA: Diagnosis not present

## 2011-07-14 DIAGNOSIS — M545 Low back pain: Secondary | ICD-10-CM | POA: Diagnosis not present

## 2011-07-14 DIAGNOSIS — Z79899 Other long term (current) drug therapy: Secondary | ICD-10-CM | POA: Diagnosis not present

## 2011-07-14 DIAGNOSIS — L97509 Non-pressure chronic ulcer of other part of unspecified foot with unspecified severity: Secondary | ICD-10-CM | POA: Diagnosis not present

## 2011-07-28 DIAGNOSIS — H169 Unspecified keratitis: Secondary | ICD-10-CM | POA: Diagnosis not present

## 2011-08-18 DIAGNOSIS — B351 Tinea unguium: Secondary | ICD-10-CM | POA: Diagnosis not present

## 2011-08-18 DIAGNOSIS — M79609 Pain in unspecified limb: Secondary | ICD-10-CM | POA: Diagnosis not present

## 2011-08-18 DIAGNOSIS — I739 Peripheral vascular disease, unspecified: Secondary | ICD-10-CM | POA: Diagnosis not present

## 2011-08-18 DIAGNOSIS — L608 Other nail disorders: Secondary | ICD-10-CM | POA: Diagnosis not present

## 2011-08-18 DIAGNOSIS — L97509 Non-pressure chronic ulcer of other part of unspecified foot with unspecified severity: Secondary | ICD-10-CM | POA: Diagnosis not present

## 2011-08-19 DIAGNOSIS — N3941 Urge incontinence: Secondary | ICD-10-CM | POA: Diagnosis not present

## 2011-08-25 DIAGNOSIS — N3941 Urge incontinence: Secondary | ICD-10-CM | POA: Diagnosis not present

## 2011-08-25 DIAGNOSIS — H10509 Unspecified blepharoconjunctivitis, unspecified eye: Secondary | ICD-10-CM | POA: Diagnosis not present

## 2011-09-01 DIAGNOSIS — L97509 Non-pressure chronic ulcer of other part of unspecified foot with unspecified severity: Secondary | ICD-10-CM | POA: Diagnosis not present

## 2011-09-02 DIAGNOSIS — N3941 Urge incontinence: Secondary | ICD-10-CM | POA: Diagnosis not present

## 2011-09-08 DIAGNOSIS — H10509 Unspecified blepharoconjunctivitis, unspecified eye: Secondary | ICD-10-CM | POA: Diagnosis not present

## 2011-09-09 DIAGNOSIS — N3941 Urge incontinence: Secondary | ICD-10-CM | POA: Diagnosis not present

## 2011-09-16 DIAGNOSIS — L97509 Non-pressure chronic ulcer of other part of unspecified foot with unspecified severity: Secondary | ICD-10-CM | POA: Diagnosis not present

## 2011-09-16 DIAGNOSIS — N3941 Urge incontinence: Secondary | ICD-10-CM | POA: Diagnosis not present

## 2011-09-23 DIAGNOSIS — N3944 Nocturnal enuresis: Secondary | ICD-10-CM | POA: Diagnosis not present

## 2011-09-23 DIAGNOSIS — N3946 Mixed incontinence: Secondary | ICD-10-CM | POA: Diagnosis not present

## 2011-09-29 DIAGNOSIS — L97509 Non-pressure chronic ulcer of other part of unspecified foot with unspecified severity: Secondary | ICD-10-CM | POA: Diagnosis not present

## 2011-09-30 DIAGNOSIS — N3941 Urge incontinence: Secondary | ICD-10-CM | POA: Diagnosis not present

## 2011-10-07 DIAGNOSIS — N3941 Urge incontinence: Secondary | ICD-10-CM | POA: Diagnosis not present

## 2011-10-14 DIAGNOSIS — N3941 Urge incontinence: Secondary | ICD-10-CM | POA: Diagnosis not present

## 2011-10-21 DIAGNOSIS — N3941 Urge incontinence: Secondary | ICD-10-CM | POA: Diagnosis not present

## 2011-10-21 DIAGNOSIS — L97509 Non-pressure chronic ulcer of other part of unspecified foot with unspecified severity: Secondary | ICD-10-CM | POA: Diagnosis not present

## 2011-10-28 DIAGNOSIS — N3941 Urge incontinence: Secondary | ICD-10-CM | POA: Diagnosis not present

## 2011-10-28 DIAGNOSIS — M2040 Other hammer toe(s) (acquired), unspecified foot: Secondary | ICD-10-CM | POA: Diagnosis not present

## 2011-11-03 DIAGNOSIS — Z23 Encounter for immunization: Secondary | ICD-10-CM | POA: Diagnosis not present

## 2011-11-04 DIAGNOSIS — R350 Frequency of micturition: Secondary | ICD-10-CM | POA: Diagnosis not present

## 2011-11-04 DIAGNOSIS — N3941 Urge incontinence: Secondary | ICD-10-CM | POA: Diagnosis not present

## 2011-11-04 DIAGNOSIS — N3946 Mixed incontinence: Secondary | ICD-10-CM | POA: Diagnosis not present

## 2011-11-09 DIAGNOSIS — H01009 Unspecified blepharitis unspecified eye, unspecified eyelid: Secondary | ICD-10-CM | POA: Diagnosis not present

## 2011-11-15 ENCOUNTER — Emergency Department: Payer: Self-pay | Admitting: *Deleted

## 2011-11-15 DIAGNOSIS — Z0389 Encounter for observation for other suspected diseases and conditions ruled out: Secondary | ICD-10-CM | POA: Diagnosis not present

## 2011-11-15 DIAGNOSIS — S0100XA Unspecified open wound of scalp, initial encounter: Secondary | ICD-10-CM | POA: Diagnosis not present

## 2011-11-15 DIAGNOSIS — S52599A Other fractures of lower end of unspecified radius, initial encounter for closed fracture: Secondary | ICD-10-CM | POA: Diagnosis not present

## 2011-11-15 DIAGNOSIS — Z9889 Other specified postprocedural states: Secondary | ICD-10-CM | POA: Diagnosis not present

## 2011-11-15 DIAGNOSIS — M542 Cervicalgia: Secondary | ICD-10-CM | POA: Diagnosis not present

## 2011-11-15 DIAGNOSIS — S139XXA Sprain of joints and ligaments of unspecified parts of neck, initial encounter: Secondary | ICD-10-CM | POA: Diagnosis not present

## 2011-11-15 DIAGNOSIS — S060X0A Concussion without loss of consciousness, initial encounter: Secondary | ICD-10-CM | POA: Diagnosis not present

## 2011-11-15 DIAGNOSIS — Z043 Encounter for examination and observation following other accident: Secondary | ICD-10-CM | POA: Diagnosis not present

## 2011-11-15 DIAGNOSIS — M7989 Other specified soft tissue disorders: Secondary | ICD-10-CM | POA: Diagnosis not present

## 2011-11-15 DIAGNOSIS — S0993XA Unspecified injury of face, initial encounter: Secondary | ICD-10-CM | POA: Diagnosis not present

## 2011-11-16 DIAGNOSIS — S199XXA Unspecified injury of neck, initial encounter: Secondary | ICD-10-CM | POA: Diagnosis not present

## 2011-11-16 DIAGNOSIS — M542 Cervicalgia: Secondary | ICD-10-CM | POA: Diagnosis not present

## 2011-11-22 ENCOUNTER — Encounter: Payer: Self-pay | Admitting: Internal Medicine

## 2011-11-22 ENCOUNTER — Ambulatory Visit (INDEPENDENT_AMBULATORY_CARE_PROVIDER_SITE_OTHER): Payer: Medicare Other | Admitting: Internal Medicine

## 2011-11-22 VITALS — BP 132/60 | HR 73 | Temp 97.6°F | Resp 16 | Wt 159.0 lb

## 2011-11-22 DIAGNOSIS — Z4802 Encounter for removal of sutures: Secondary | ICD-10-CM | POA: Diagnosis not present

## 2011-11-22 DIAGNOSIS — S0100XA Unspecified open wound of scalp, initial encounter: Secondary | ICD-10-CM

## 2011-11-22 DIAGNOSIS — S0101XA Laceration without foreign body of scalp, initial encounter: Secondary | ICD-10-CM
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
## 2011-11-23 DIAGNOSIS — S52599A Other fractures of lower end of unspecified radius, initial encounter for closed fracture: Secondary | ICD-10-CM | POA: Diagnosis not present

## 2011-11-23 DIAGNOSIS — S5290XD Unspecified fracture of unspecified forearm, subsequent encounter for closed fracture with routine healing: Secondary | ICD-10-CM | POA: Diagnosis not present

## 2011-11-25 DIAGNOSIS — N3941 Urge incontinence: Secondary | ICD-10-CM | POA: Diagnosis not present

## 2011-11-28 ENCOUNTER — Ambulatory Visit: Payer: Medicare Other | Admitting: Internal Medicine

## 2011-12-05 ENCOUNTER — Encounter: Payer: Self-pay | Admitting: Internal Medicine

## 2011-12-05 ENCOUNTER — Other Ambulatory Visit (INDEPENDENT_AMBULATORY_CARE_PROVIDER_SITE_OTHER): Payer: Medicare Other

## 2011-12-05 ENCOUNTER — Telehealth: Payer: Self-pay | Admitting: Internal Medicine

## 2011-12-05 ENCOUNTER — Ambulatory Visit (INDEPENDENT_AMBULATORY_CARE_PROVIDER_SITE_OTHER): Payer: Medicare Other | Admitting: Internal Medicine

## 2011-12-05 ENCOUNTER — Encounter (INDEPENDENT_AMBULATORY_CARE_PROVIDER_SITE_OTHER): Payer: Medicare Other

## 2011-12-05 VITALS — BP 132/62 | HR 69 | Temp 96.5°F | Ht 64.0 in | Wt 163.0 lb

## 2011-12-05 DIAGNOSIS — I10 Essential (primary) hypertension: Secondary | ICD-10-CM

## 2011-12-05 DIAGNOSIS — M7989 Other specified soft tissue disorders: Secondary | ICD-10-CM

## 2011-12-05 DIAGNOSIS — R609 Edema, unspecified: Secondary | ICD-10-CM

## 2011-12-05 DIAGNOSIS — M79604 Pain in right leg: Secondary | ICD-10-CM

## 2011-12-05 DIAGNOSIS — M79609 Pain in unspecified limb: Secondary | ICD-10-CM

## 2011-12-05 DIAGNOSIS — R600 Localized edema: Secondary | ICD-10-CM

## 2011-12-05 LAB — BASIC METABOLIC PANEL
CO2: 29 mEq/L (ref 19–32)
Chloride: 102 mEq/L (ref 96–112)
Sodium: 138 mEq/L (ref 135–145)

## 2011-12-05 MED ORDER — FUROSEMIDE 20 MG PO TABS: 20.0000 mg | ORAL_TABLET | Freq: Every day | ORAL | Status: DC

## 2011-12-05 NOTE — Progress Notes (Signed)
Subjective:    Patient ID: Tracy Sharp, female    DOB: [birth year], 78 y.o.   MRN: [id number]  HPI  Pt presents to clinic today with BLE edema x 4 days. Pt has taken lasix for the last 3 days with mild resolution of the swelling. Pt does c/o pain in her right calf, but has not been on any long trips or plane rides. She has not been running any fevers. She does have a history of leg swelling for which she was placed on Lasix 20 mg three days a week by Dr. Debby Bud, but was subsequently taken off by her urologist due to stress and urge incontinence. Since then, patient has noticed and increase her her lower extremity edema.  Review of Systems      Past Medical History  Diagnosis Date  . Overweight   . Chest pain, unspecified   . Dyspnea   . CAD (coronary artery disease)     cardiac cath '07 - no obstructive disease  . Hyperlipidemia   . Hypertension   . Gait disturbance   . Gastritis   . Osteoarthritis   . Anxiety   . Spasm of muscle   . Leg pain, left   . Bilateral shoulder pain   . Mixed incontinence urge and stress (female)(female)     irritibile bladder  . Osteoporosis   . Phlebitis     one leg  . IBS (irritable bowel syndrome)   . Umbilical hernia   . Depression     Current Outpatient Prescriptions  Medication Sig Dispense Refill  . acetaminophen (TYLENOL) 650 MG CR tablet Take 1,300 mg by mouth 2 (two) times daily.      Marland Kitchen aspirin 81 MG tablet Take 81 mg by mouth daily.        . Calcium Polycarbophil (FIBER LAXATIVE PO) Take by mouth daily.        . Calcium-Vitamin D (CALTRATE 600 PLUS-VIT D PO) Take 1 tablet by mouth daily.        . cholecalciferol (VITAMIN D) 1000 UNITS tablet Take 1,000 Units by mouth daily.        . furosemide (LASIX) 20 MG tablet Take 20 mg by mouth. 3 times a week      . HYDROcodone-acetaminophen (NORCO) 5-325 MG per tablet Take 1 tablet by mouth every 8 (eight) hours as needed.        . Melatonin 5 MG TABS Take 1 tablet by mouth at bedtime as  needed.        . meloxicam (MOBIC) 15 MG tablet Daily.      . Misc. Devices [facility]) MISC by Does not apply route. Walker w/ seat prn       . multivitamin (THERAGRAN) per tablet Take 1 tablet by mouth daily.        . ranitidine (ZANTAC) 150 MG capsule Take 150 mg by mouth daily.        . sertraline (ZOLOFT) 50 MG tablet TAKE 1 TABLET (50 MG TOTAL) BY MOUTH DAILY.  30 tablet  5  . traMADol (ULTRAM) 50 MG tablet Take 50 mg by mouth every 6 (six) hours as needed.      Marland Kitchen TRAMADOL HCL PO Take 20 mg by mouth 3 (three) times daily.          No Known Allergies  Family History  Problem Relation Age of Onset  . Heart attack Mother   . Diabetes Mother   . Heart disease Mother  CAD/MI  . Diabetes Sister   . Heart disease Sister     CAD/MI  . Diabetes Brother   . Diabetes Brother   . Diabetes Brother   . Cancer Sister     intestinal vs lung cancer  . Cancer Brother     stomach  . Alcohol abuse Brother     cirrhosis    History   Social History  . Marital Status: Married    Spouse Name: N/A    Number of Children: N/A  . Years of Education: 8   Occupational History  . Scientist, product/process development     retired   Social History Main Topics  . Smoking status: Never Smoker   . Smokeless tobacco: Never Used  . Alcohol Use: No  . Drug Use: No  . Sexually Active: Not Currently   Other Topics Concern  . Not on file   Social History Narrative   *th grade. Married '49. 1 son '50; 1 dtr '56; 3 grandchildren, 2 great-grands. Lives with SO in their own home. End of life care (May '12): no CPR, no prolonged mechanical ventilation, No HD, no futile or prolonged heroic measures.     Constitutional: Denies fever, malaise, fatigue, headache or abrupt weight changes.  Respiratory: Denies difficulty breathing, shortness of breath, cough or sputum production.   Cardiovascular: Denies chest pain, chest tightness, palpitations or swelling in the hands or feet.  Musculoskeletal:  Positive difficulty with  gait, uses walker. Denies decrease in range of motion, muscle pain or joint pain and swelling.  Skin: Denies redness, rashes, lesions or ulcercations.  Neurological: Denies dizziness, difficulty with memory, difficulty with speech or problems with balance and coordination.   No other specific complaints in a complete review of systems (except as listed in HPI above).  Objective:   Physical Exam  BP 132/62  Pulse 69  Temp 96.5 F (35.8 C) (Oral)  Ht 5\' 4"  (1.626 m)  Wt 163 lb (73.936 kg)  BMI 27.98 kg/m2  SpO2 92% Wt Readings from Last 3 Encounters:  12/05/11 163 lb (73.936 kg)  11/22/11 159 lb (72.122 kg)  01/05/11 161 lb (73.029 kg)    General: Appears her stated age, well developed, well nourished in NAD.  Cardiovascular: Normal rate and rhythm. S1,S2 noted.  No murmur, rubs or gallops noted. No JVD or BLE edema. No carotid bruits noted. Pulmonary/Chest: Normal effort and positive vesicular breath sounds. No respiratory distress. No wheezes, rales or ronchi noted.  Musculoskeletal: Normal range of motion. No signs of joint swelling. 3+ pitting edema of the right leg, 2+ pitting edema of the left leg.  Skin: Varicose veins present on BLE but no evidence of erythema or cellulitis.  BMET    Component Value Date/Time   NA 141 09/03/2010 2349   K 4.0 09/03/2010 2349   CL 107 09/03/2010 2349   CO2 28 12/09/2009 1608   GLUCOSE 101* 09/03/2010 2349   BUN 26* 09/03/2010 2349   CREATININE 0.60 09/03/2010 2349   CALCIUM 9.8 12/09/2009 1608   GFRNONAA 103.50 12/09/2009 1608   GFRAA  Value: >60        The eGFR has been calculated using the MDRD equation. This calculation has not been validated in all clinical situations. eGFR's persistently <60 mL/min signify possible Chronic Kidney Disease. 06/12/2008 0610    Lipid Panel     Component Value Date/Time   CHOL 169 12/09/2009 1608   TRIG 113.0 12/09/2009 1608   HDL 53.40 12/09/2009 1608  CHOLHDL 3 12/09/2009 1608   VLDL 22.6 12/09/2009 1608    LDLCALC 93 12/09/2009 1608    CBC    Component Value Date/Time   WBC 6.4 09/03/2010 2312   RBC 4.17 09/03/2010 2312   HGB 12.9 09/03/2010 2349   HCT 38.0 09/03/2010 2349   PLT 171 09/03/2010 2312   MCV 89.0 09/03/2010 2312   MCH 29.7 09/03/2010 2312   MCHC 33.4 09/03/2010 2312   RDW 13.1 09/03/2010 2312   LYMPHSABS 1.4 12/09/2009 1608   MONOABS 0.6 12/09/2009 1608   EOSABS 0.1 12/09/2009 1608   BASOSABS 0.0 12/09/2009 1608    Hgb A1C No results found for this basename: HGBA1C         Assessment & Plan:   Hypertension BLE edema  Start taking prescribed Lasix 20 mg daily Will obtain BMET to rule out kidney dysfunction Referra lfor RLE doppler

## 2011-12-05 NOTE — Patient Instructions (Signed)
Peripheral Edema  You have swelling in your legs (peripheral edema). This swelling is due to excess accumulation of salt and water in your body. Edema may be a sign of heart, kidney or liver disease, or a side effect of a medication. It may also be due to problems in the leg veins. Elevating your legs and using special support stockings may be very helpful, if the cause of the swelling is due to poor venous circulation. Avoid long periods of standing, whatever the cause.  Treatment of edema depends on identifying the cause. Chips, pretzels, pickles and other salty foods should be avoided. Restricting salt in your diet is almost always needed. Water pills (diuretics) are often used to remove the excess salt and water from your body via urine. These medicines prevent the kidney from reabsorbing sodium. This increases urine flow.  Diuretic treatment may also result in lowering of potassium levels in your body. Potassium supplements may be needed if you have to use diuretics daily. Daily weights can help you keep track of your progress in clearing your edema. You should call your caregiver for follow up care as recommended.  SEEK IMMEDIATE MEDICAL CARE IF:    You have increased swelling, pain, redness, or heat in your legs.   You develop shortness of breath, especially when lying down.   You develop chest or abdominal pain, weakness, or fainting.   You have a fever.  Document Released: 02/25/2004 Document Revised: 04/11/2011 Document Reviewed: 02/04/2009  [employer] Patient Information 2013 [employer].

## 2011-12-05 NOTE — Telephone Encounter (Signed)
Caller: [name]/Daughter in Social worker; Patient Name: Tracy Sharp; PCP: Illene Regulus (Adults only); Best Callback Phone Number: [phone] Talya developed bilateral foot, ankle, and lower leg edema on 12/01/11.  Has been elevating her feet with minor relief.  Denies pain.  Afebrile.  Edema almost twice the normal size as compared to usual appearance.  Utilized Leg Non-Injury Guideline.  See PCP within 4 hrs due to "New marked swelling".  Scheduled appt for today at 1430 with Nicki Reaper, NP.

## 2011-12-06 ENCOUNTER — Other Ambulatory Visit: Payer: Self-pay | Admitting: Internal Medicine

## 2011-12-06 ENCOUNTER — Encounter: Payer: Self-pay | Admitting: Internal Medicine

## 2011-12-06 DIAGNOSIS — T502X5A Adverse effect of carbonic-anhydrase inhibitors, benzothiadiazides and other diuretics, initial encounter: Secondary | ICD-10-CM

## 2011-12-06 DIAGNOSIS — E876 Hypokalemia: Secondary | ICD-10-CM

## 2011-12-06 DIAGNOSIS — R609 Edema, unspecified: Secondary | ICD-10-CM

## 2011-12-06 MED ORDER — POTASSIUM CHLORIDE CRYS ER 20 MEQ PO TBCR: 20.0000 meq | EXTENDED_RELEASE_TABLET | Freq: Every day | ORAL | Status: DC

## 2011-12-06 NOTE — Progress Notes (Signed)
Pts potassium level on BMET was 3.6. At her visit yesterday we increased her Lasix 20 mg dose to be taken daily. I have sent an eRx in for a potassium supplement.

## 2011-12-07 DIAGNOSIS — S5290XD Unspecified fracture of unspecified forearm, subsequent encounter for closed fracture with routine healing: Secondary | ICD-10-CM | POA: Diagnosis not present

## 2011-12-07 DIAGNOSIS — S52599A Other fractures of lower end of unspecified radius, initial encounter for closed fracture: Secondary | ICD-10-CM | POA: Diagnosis not present

## 2011-12-13 ENCOUNTER — Encounter: Payer: Self-pay | Admitting: Internal Medicine

## 2011-12-13 ENCOUNTER — Telehealth: Payer: Self-pay | Admitting: Internal Medicine

## 2011-12-13 ENCOUNTER — Ambulatory Visit (INDEPENDENT_AMBULATORY_CARE_PROVIDER_SITE_OTHER): Payer: Medicare Other | Admitting: Internal Medicine

## 2011-12-13 VITALS — BP 134/62 | HR 73 | Temp 97.4°F | Ht 64.0 in | Wt 165.4 lb

## 2011-12-13 DIAGNOSIS — L03115 Cellulitis of right lower limb: Secondary | ICD-10-CM

## 2011-12-13 DIAGNOSIS — R609 Edema, unspecified: Secondary | ICD-10-CM | POA: Diagnosis not present

## 2011-12-13 DIAGNOSIS — N3946 Mixed incontinence: Secondary | ICD-10-CM

## 2011-12-13 DIAGNOSIS — L03119 Cellulitis of unspecified part of limb: Secondary | ICD-10-CM | POA: Diagnosis not present

## 2011-12-13 DIAGNOSIS — L02419 Cutaneous abscess of limb, unspecified: Secondary | ICD-10-CM | POA: Diagnosis not present

## 2011-12-13 DIAGNOSIS — R600 Localized edema: Secondary | ICD-10-CM

## 2011-12-13 MED ORDER — CEPHALEXIN 500 MG PO CAPS: 500.0000 mg | ORAL_CAPSULE | Freq: Three times a day (TID) | ORAL | Status: DC

## 2011-12-13 NOTE — Assessment & Plan Note (Signed)
Exacerbated by use of furosemide - Conservative control strategies discussed with pt/family today

## 2011-12-13 NOTE — Telephone Encounter (Signed)
Tracy Sharp, daughter in law calling.  Her right leg is swollen to her thigh.  Has been taking the Lasix as ordered and it helped to reduce the edema initially.  This swelling developed over night.   This leg is aching today, constantly.   Triaged per Edema, atraumatic.  Needs to be seen in 24 hours.  RN override to have her seen today.  Scheduled for 1130 with Dr. Felicity Coyer.

## 2011-12-13 NOTE — Patient Instructions (Signed)
It was good to see you today. We have reviewed your recent records including labs and tests today Use Keflex antibiotics for infection in leg - 3x/day x 1 week - Your prescription(s) have been submitted to your pharmacy. Please take as directed and contact our office if you believe you are having problem(s) with the medication(s). Continue Lasix daily for fluid and use tramadol as needed for pain symptoms  Cellulitis Cellulitis is an infection of the skin and the tissue beneath it. The infected area is usually red and tender. Cellulitis occurs most often in the arms and lower legs.   CAUSES   Cellulitis is caused by bacteria that enter the skin through cracks or cuts in the skin. The most common types of bacteria that cause cellulitis are Staphylococcus and Streptococcus. SYMPTOMS    Redness and warmth.   Swelling.   Tenderness or pain.   Fever.  DIAGNOSIS  Your caregiver can usually determine what is wrong based on a physical exam. Blood tests may also be done. TREATMENT   Treatment usually involves taking an antibiotic medicine. HOME CARE INSTRUCTIONS    Take your antibiotics as directed. Finish them even if you start to feel better.   Keep the infected arm or leg elevated to reduce swelling.   Apply a warm cloth to the affected area up to 4 times per day to relieve pain.   Only take over-the-counter or prescription medicines for pain, discomfort, or fever as directed by your caregiver.   Keep all follow-up appointments as directed by your caregiver.  SEEK MEDICAL CARE IF:    You notice red streaks coming from the infected area.   Your red area gets larger or turns dark in color.   Your bone or joint underneath the infected area becomes painful after the skin has healed.   Your infection returns in the same area or another area.   You notice a swollen bump in the infected area.   You develop new symptoms.  SEEK IMMEDIATE MEDICAL CARE IF:    You have a fever.   You  feel very sleepy.   You develop vomiting or diarrhea.   You have a general ill feeling (malaise) with muscle aches and pains.  MAKE SURE YOU:    Understand these instructions.   Will watch your condition.   Will get help right away if you are not doing well or get worse.  Document Released: 10/27/2004 Document Revised: 07/19/2011 Document Reviewed: 04/04/2011 [facility] Patient Information 2013 [place], [place].

## 2011-12-13 NOTE — Progress Notes (Signed)
  Subjective:    Patient ID: Tracy Sharp, female    DOB: 1924/12/23, 78 y.o.   MRN: [id number]  HPI  complains of increasing RLE pain and swelling, now red x 2 days OV for same 11/4 reviewed: neg DVT, on lasix qd, BMet normal  Past Medical History  Diagnosis Date  . Overweight   . Chest pain, unspecified   . Dyspnea   . CAD (coronary artery disease)     cardiac cath '07 - no obstructive disease  . Hyperlipidemia   . Hypertension   . Gait disturbance   . Gastritis   . Osteoarthritis   . Anxiety   . Spasm of muscle   . Leg pain, left   . Bilateral shoulder pain   . Mixed incontinence urge and stress (female)(female)     irritibile bladder  . Osteoporosis   . Phlebitis     one leg  . IBS (irritable bowel syndrome)   . Umbilical hernia   . Depression     Review of Systems  Constitutional: Positive for fatigue. Negative for fever and unexpected weight change.  Respiratory: Negative for cough and shortness of breath.   Cardiovascular: Negative for chest pain. Leg swelling: but improving since 11/4 with lasix 20 qd until RLE change in past 48h.  Musculoskeletal: Positive for arthralgias. Negative for myalgias and back pain.       Objective:   Physical Exam BP 134/62  Pulse 73  Temp 97.4 F (36.3 C) (Oral)  Ht 5\' 4"  (1.626 m)  Wt 165 lb 6.4 oz (75.025 kg)  BMI 28.39 kg/m2  SpO2 97% Wt Readings from Last 3 Encounters:  12/13/11 165 lb 6.4 oz (75.025 kg)  12/05/11 163 lb (73.936 kg)  11/22/11 159 lb (72.122 kg)   Constitutional: She is overweight, uses RW -appears well-developed and well-nourished. No distress. Nontoxic - Dtr in law at side  Neck: Normal range of motion. Neck supple. No JVD present. No thyromegaly present.  Cardiovascular: Normal rate, regular rhythm and normal heart sounds.  No murmur heard. R>L edema, improved since 11/4 per family. Pulmonary/Chest: Effort normal and breath sounds normal. No respiratory distress. She has no wheezes.  Skin: distal  RLE cellulitis (red and warm) - no open ulceration or laceration.  Psychiatric: She has a normal mood and affect. Her behavior is normal. Judgment and thought content normal.   Lab Results  Component Value Date   WBC 6.4 09/03/2010   HGB 12.9 09/03/2010   HCT 38.0 09/03/2010   PLT 171 09/03/2010   GLUCOSE 90 12/05/2011   CHOL 169 12/09/2009   TRIG 113.0 12/09/2009   HDL 53.40 12/09/2009   LDLDIRECT 140.1 11/03/2006   LDLCALC 93 12/09/2009   ALT 14 12/09/2009   AST 17 12/09/2009   NA 138 12/05/2011   K 3.6 12/05/2011   CL 102 12/05/2011   CREATININE 0.7 12/05/2011   BUN 18 12/05/2011   CO2 29 12/05/2011   TSH 0.87 12/09/2009   INR 1.7* 06/16/2008   Venous doppler 12/05/11: no DVT RLE     Assessment & Plan:   RLE cellulitis - Kelfex - education on dx and conservative care -  continue tylenol _ tramadol as needed for pain  Edema, peripheral - improving with diuretic - continue same Interval hx/labs/imaging reviewed with family today

## 2011-12-16 DIAGNOSIS — N3941 Urge incontinence: Secondary | ICD-10-CM | POA: Diagnosis not present

## 2011-12-21 DIAGNOSIS — S5290XD Unspecified fracture of unspecified forearm, subsequent encounter for closed fracture with routine healing: Secondary | ICD-10-CM | POA: Diagnosis not present

## 2011-12-22 ENCOUNTER — Other Ambulatory Visit: Payer: Self-pay | Admitting: Internal Medicine

## 2012-01-04 DIAGNOSIS — S5290XD Unspecified fracture of unspecified forearm, subsequent encounter for closed fracture with routine healing: Secondary | ICD-10-CM | POA: Diagnosis not present

## 2012-01-06 DIAGNOSIS — N3941 Urge incontinence: Secondary | ICD-10-CM | POA: Diagnosis not present

## 2012-01-12 DIAGNOSIS — M2550 Pain in unspecified joint: Secondary | ICD-10-CM | POA: Diagnosis not present

## 2012-01-12 DIAGNOSIS — R29818 Other symptoms and signs involving the nervous system: Secondary | ICD-10-CM | POA: Diagnosis not present

## 2012-01-12 DIAGNOSIS — M1990 Unspecified osteoarthritis, unspecified site: Secondary | ICD-10-CM | POA: Diagnosis not present

## 2012-01-12 DIAGNOSIS — M545 Low back pain: Secondary | ICD-10-CM | POA: Diagnosis not present

## 2012-01-12 DIAGNOSIS — T148XXA Other injury of unspecified body region, initial encounter: Secondary | ICD-10-CM | POA: Diagnosis not present

## 2012-01-20 ENCOUNTER — Encounter: Payer: Self-pay | Admitting: Internal Medicine

## 2012-01-20 ENCOUNTER — Ambulatory Visit (INDEPENDENT_AMBULATORY_CARE_PROVIDER_SITE_OTHER): Payer: Medicare Other | Admitting: Internal Medicine

## 2012-01-20 VITALS — BP 130/68 | HR 79 | Temp 97.5°F | Wt 150.8 lb

## 2012-01-20 DIAGNOSIS — F411 Generalized anxiety disorder: Secondary | ICD-10-CM | POA: Diagnosis not present

## 2012-01-20 DIAGNOSIS — L02429 Furuncle of limb, unspecified: Secondary | ICD-10-CM

## 2012-01-20 MED ORDER — CEPHALEXIN 500 MG PO CAPS: 500.0000 mg | ORAL_CAPSULE | Freq: Four times a day (QID) | ORAL | Status: DC

## 2012-01-20 NOTE — Patient Instructions (Addendum)
Boil in the right armpit - doesn't look to bad and it has spread. Also, no fever. Plan Warm compresses until pretty much gone  Dress with a large bandaide with a little triple antibiotic ointment once a day  Keflex 500 mg 4 times a day for a week.   For anxiety that is situational - please increase zoloft to 100 mg ( 2 x 50 mg) once a day until you are doing better at which time you can resume 50 mg daily.  Abscess An abscess is an infected area that contains a collection of pus and debris. It can occur in almost any part of the body. An abscess is also known as a furuncle or boil. CAUSES   An abscess occurs when tissue gets infected. This can occur from blockage of oil or sweat glands, infection of hair follicles, or a minor injury to the skin. As the body tries to fight the infection, pus collects in the area and creates pressure under the skin. This pressure causes pain. People with weakened immune systems have difficulty fighting infections and get certain abscesses more often.   SYMPTOMS Usually an abscess develops on the skin and becomes a painful mass that is red, warm, and tender. If the abscess forms under the skin, you may feel a moveable soft area under the skin. Some abscesses break open (rupture) on their own, but most will continue to get worse without care. The infection can spread deeper into the body and eventually into the bloodstream, causing you to feel ill.   DIAGNOSIS   Your caregiver will take your medical history and perform a physical exam. A sample of fluid may also be taken from the abscess to determine what is causing your infection. TREATMENT   Your caregiver may prescribe antibiotic medicines to fight the infection. However, taking antibiotics alone usually does not cure an abscess. Your caregiver may need to make a small cut (incision) in the abscess to drain the pus. In some cases, gauze is packed into the abscess to reduce pain and to continue draining the  area. HOME CARE INSTRUCTIONS    Only take over-the-counter or prescription medicines for pain, discomfort, or fever as directed by your caregiver.   If you were prescribed antibiotics, take them as directed. Finish them even if you start to feel better.   If gauze is used, follow your caregiver's directions for changing the gauze.   To avoid spreading the infection:   Keep your draining abscess covered with a bandage.   Wash your hands well.   Do not share personal care items, towels, or whirlpools with others.   Avoid skin contact with others.   Keep your skin and clothes clean around the abscess.   Keep all follow-up appointments as directed by your caregiver.  SEEK MEDICAL CARE IF:    You have increased pain, swelling, redness, fluid drainage, or bleeding.   You have muscle aches, chills, or a general ill feeling.   You have a fever.  MAKE SURE YOU:    Understand these instructions.   Will watch your condition.   Will get help right away if you are not doing well or get worse.  Document Released: 10/27/2004 Document Revised: 07/19/2011 Document Reviewed: 04/01/2011 [facility] Patient Information 2013 [place], [place].

## 2012-01-21 ENCOUNTER — Ambulatory Visit: Payer: Medicare Other | Admitting: Family Medicine

## 2012-01-23 DIAGNOSIS — Z961 Presence of intraocular lens: Secondary | ICD-10-CM | POA: Diagnosis not present

## 2012-01-23 DIAGNOSIS — H01009 Unspecified blepharitis unspecified eye, unspecified eyelid: Secondary | ICD-10-CM | POA: Diagnosis not present

## 2012-01-23 DIAGNOSIS — H04129 Dry eye syndrome of unspecified lacrimal gland: Secondary | ICD-10-CM | POA: Diagnosis not present

## 2012-01-26 NOTE — Progress Notes (Signed)
  Subjective:    Patient ID: Tracy Sharp, female    DOB: [birth year], 78 y.o.   MRN: [id number]  HPI Mrs. Tracy Sharp presents for evaluation of a boil in the right armpit. Per her daughter - in - law there was spontaneous drainage from the lesion and it is now much smaller. No reported fever, no pain or tenderness at the site.  PMH, FamHx and SocHx reviewed for any changes and relevance. Current Outpatient Prescriptions on File Prior to Visit  Medication Sig Dispense Refill  . acetaminophen (TYLENOL) 650 MG CR tablet Take 1,300 mg by mouth 2 (two) times daily.      Marland Kitchen aspirin 81 MG tablet Take 81 mg by mouth daily.        . Calcium Polycarbophil (FIBER LAXATIVE PO) Take by mouth daily.        . Calcium-Vitamin D (CALTRATE 600 PLUS-VIT D PO) Take 1 tablet by mouth daily.        . cholecalciferol (VITAMIN D) 1000 UNITS tablet Take 1,000 Units by mouth daily.        . meloxicam (MOBIC) 15 MG tablet Daily.      . multivitamin (THERAGRAN) per tablet Take 1 tablet by mouth daily.        . potassium chloride SA (K-DUR,KLOR-CON) 20 MEQ tablet Take 1 tablet (20 mEq total) by mouth daily.  30 tablet  3  . sertraline (ZOLOFT) 50 MG tablet TAKE 1 TABLET (50 MG TOTAL) BY MOUTH DAILY.  30 tablet  5  . traMADol (ULTRAM) 50 MG tablet Take 50 mg by mouth every 6 (six) hours as needed.      . furosemide (LASIX) 20 MG tablet Take 1 tablet (20 mg total) by mouth daily.  30 tablet  3      Review of Systems System review is negative for any constitutional, cardiac, pulmonary, GI or neuro symptoms or complaints other than as described in the HPI.     Objective:   Physical Exam Filed Vitals:   01/20/12 1439  BP: 130/68  Pulse: 79  Temp: 97.5 F (36.4 C)   Gen'l-Elderly white woman in no distress Cor- RRR Pulm - normal respirations Derm - small boil in the right axilla, not swollen, no enduration or tenderness, no drainage.       Assessment & Plan:  Boil in the right armpit - doesn't look to bad and it  has spread. Also, no fever. Plan Warm compresses until pretty much gone  Dress with a large bandaide with a little triple antibiotic ointment once a day  Keflex 500 mg 4 times a day for a week.

## 2012-01-26 NOTE — Assessment & Plan Note (Signed)
Many stressors, including recent placement of her husband at [facility] in [place]. She has experienced more anxiety.  Plan  Increase Zoloft to 100 mg daily

## 2012-01-31 DIAGNOSIS — S5290XD Unspecified fracture of unspecified forearm, subsequent encounter for closed fracture with routine healing: Secondary | ICD-10-CM | POA: Diagnosis not present

## 2012-02-03 DIAGNOSIS — N3941 Urge incontinence: Secondary | ICD-10-CM | POA: Diagnosis not present

## 2012-02-16 DIAGNOSIS — S5290XD Unspecified fracture of unspecified forearm, subsequent encounter for closed fracture with routine healing: Secondary | ICD-10-CM | POA: Diagnosis not present

## 2012-02-24 DIAGNOSIS — N3941 Urge incontinence: Secondary | ICD-10-CM | POA: Diagnosis not present

## 2012-03-21 DIAGNOSIS — H01009 Unspecified blepharitis unspecified eye, unspecified eyelid: Secondary | ICD-10-CM | POA: Diagnosis not present

## 2012-04-10 ENCOUNTER — Other Ambulatory Visit: Payer: Self-pay | Admitting: *Deleted

## 2012-04-10 MED ORDER — SERTRALINE HCL 50 MG PO TABS: ORAL_TABLET | ORAL | Status: DC

## 2012-04-10 NOTE — Telephone Encounter (Signed)
Left msg on triage needing sertraline 50 mg [facility] [place] for a 90 day supply...Raechel Chute

## 2012-04-10 NOTE — Telephone Encounter (Signed)
Called pt back inform her med [facility].../lmb

## 2012-04-18 DIAGNOSIS — L03039 Cellulitis of unspecified toe: Secondary | ICD-10-CM | POA: Diagnosis not present

## 2012-04-18 DIAGNOSIS — M2040 Other hammer toe(s) (acquired), unspecified foot: Secondary | ICD-10-CM | POA: Diagnosis not present

## 2012-05-02 DIAGNOSIS — L03039 Cellulitis of unspecified toe: Secondary | ICD-10-CM | POA: Diagnosis not present

## 2012-05-04 DIAGNOSIS — N3941 Urge incontinence: Secondary | ICD-10-CM | POA: Diagnosis not present

## 2012-05-04 DIAGNOSIS — H905 Unspecified sensorineural hearing loss: Secondary | ICD-10-CM | POA: Diagnosis not present

## 2012-05-09 DIAGNOSIS — B079 Viral wart, unspecified: Secondary | ICD-10-CM | POA: Diagnosis not present

## 2012-05-23 DIAGNOSIS — T8189XA Other complications of procedures, not elsewhere classified, initial encounter: Secondary | ICD-10-CM | POA: Diagnosis not present

## 2012-05-25 DIAGNOSIS — N3941 Urge incontinence: Secondary | ICD-10-CM | POA: Diagnosis not present

## 2012-06-06 DIAGNOSIS — T8189XA Other complications of procedures, not elsewhere classified, initial encounter: Secondary | ICD-10-CM | POA: Diagnosis not present

## 2012-06-08 ENCOUNTER — Emergency Department: Payer: Self-pay | Admitting: Emergency Medicine

## 2012-06-08 DIAGNOSIS — Z043 Encounter for examination and observation following other accident: Secondary | ICD-10-CM | POA: Diagnosis not present

## 2012-06-08 DIAGNOSIS — S064X9A Epidural hemorrhage with loss of consciousness of unspecified duration, initial encounter: Secondary | ICD-10-CM | POA: Diagnosis not present

## 2012-06-08 DIAGNOSIS — T148XXA Other injury of unspecified body region, initial encounter: Secondary | ICD-10-CM | POA: Diagnosis not present

## 2012-06-08 DIAGNOSIS — S0003XA Contusion of scalp, initial encounter: Secondary | ICD-10-CM | POA: Diagnosis not present

## 2012-06-08 DIAGNOSIS — S1093XA Contusion of unspecified part of neck, initial encounter: Secondary | ICD-10-CM | POA: Diagnosis not present

## 2012-06-08 DIAGNOSIS — I6789 Other cerebrovascular disease: Secondary | ICD-10-CM | POA: Diagnosis not present

## 2012-06-08 DIAGNOSIS — M25569 Pain in unspecified knee: Secondary | ICD-10-CM | POA: Diagnosis not present

## 2012-06-08 DIAGNOSIS — S0083XA Contusion of other part of head, initial encounter: Secondary | ICD-10-CM | POA: Diagnosis not present

## 2012-06-08 DIAGNOSIS — S8000XA Contusion of unspecified knee, initial encounter: Secondary | ICD-10-CM | POA: Diagnosis not present

## 2012-06-13 ENCOUNTER — Other Ambulatory Visit (INDEPENDENT_AMBULATORY_CARE_PROVIDER_SITE_OTHER): Payer: Medicare Other

## 2012-06-13 ENCOUNTER — Encounter: Payer: Self-pay | Admitting: Internal Medicine

## 2012-06-13 ENCOUNTER — Ambulatory Visit (INDEPENDENT_AMBULATORY_CARE_PROVIDER_SITE_OTHER): Payer: Medicare Other | Admitting: Internal Medicine

## 2012-06-13 VITALS — BP 130/78 | HR 70 | Temp 97.5°F | Wt 157.0 lb

## 2012-06-13 DIAGNOSIS — S0083XD Contusion of other part of head, subsequent encounter: Secondary | ICD-10-CM

## 2012-06-13 DIAGNOSIS — D699 Hemorrhagic condition, unspecified: Secondary | ICD-10-CM

## 2012-06-13 DIAGNOSIS — Z5189 Encounter for other specified aftercare: Secondary | ICD-10-CM

## 2012-06-13 DIAGNOSIS — R238 Other skin changes: Secondary | ICD-10-CM

## 2012-06-13 DIAGNOSIS — S0083XA Contusion of other part of head, initial encounter: Secondary | ICD-10-CM | POA: Insufficient documentation

## 2012-06-13 DIAGNOSIS — I10 Essential (primary) hypertension: Secondary | ICD-10-CM | POA: Diagnosis not present

## 2012-06-13 LAB — CBC WITH DIFFERENTIAL/PLATELET
Basophils Relative: 0.6 % (ref 0.0–3.0)
Eosinophils Absolute: 0.1 10*3/uL (ref 0.0–0.7)
Eosinophils Relative: 1.6 % (ref 0.0–5.0)
HCT: 39.3 % (ref 36.0–46.0)
Lymphs Abs: 1.2 10*3/uL (ref 0.7–4.0)
MCHC: 33.8 g/dL (ref 30.0–36.0)
MCV: 91.2 fl (ref 78.0–100.0)
Monocytes Absolute: 0.7 10*3/uL (ref 0.1–1.0)
Neutrophils Relative %: 70.6 % (ref 43.0–77.0)
Platelets: 247 10*3/uL (ref 150.0–400.0)

## 2012-06-13 LAB — PROTIME-INR
INR: 1.1 ratio — ABNORMAL HIGH (ref 0.8–1.0)
Prothrombin Time: 11.4 s (ref 10.2–12.4)

## 2012-06-13 NOTE — Assessment & Plan Note (Signed)
Very large, for plastic surgury eval for possible evacuation

## 2012-06-13 NOTE — Assessment & Plan Note (Signed)
stable overall by history and exam, recent data reviewed with pt, and pt to continue medical treatment as before,  to f/u any worsening symptoms or concerns BP Readings from Last 3 Encounters:  06/13/12 130/78  01/20/12 130/68  12/13/11 134/62
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
## 2012-06-13 NOTE — Assessment & Plan Note (Signed)
Also for cbc/inr, ptt,  to f/u any worsening symptoms or concerns

## 2012-06-13 NOTE — Patient Instructions (Signed)
Please continue all other medications as before, and refills have been done if requested. Please go to the LAB in the Basement (turn left off the elevator) for the tests to be done today You will be contacted by phone if any changes need to be made immediately.  Otherwise, you will receive a letter about your results with an explanation You will be contacted regarding the referral for: plastic surgury Please remember to sign up for My Chart if you have not done so, as this will be important to you in the future with finding out test results, communicating by private email, and scheduling acute appointments online when needed.

## 2012-06-15 DIAGNOSIS — N3941 Urge incontinence: Secondary | ICD-10-CM | POA: Diagnosis not present

## 2012-06-20 ENCOUNTER — Encounter: Payer: Self-pay | Admitting: Internal Medicine

## 2012-06-20 ENCOUNTER — Ambulatory Visit (INDEPENDENT_AMBULATORY_CARE_PROVIDER_SITE_OTHER): Payer: Medicare Other | Admitting: Internal Medicine

## 2012-06-20 VITALS — BP 142/62 | HR 72 | Temp 96.8°F | Ht 64.0 in | Wt 162.0 lb

## 2012-06-20 DIAGNOSIS — I809 Phlebitis and thrombophlebitis of unspecified site: Secondary | ICD-10-CM | POA: Diagnosis not present

## 2012-06-20 DIAGNOSIS — L03119 Cellulitis of unspecified part of limb: Secondary | ICD-10-CM

## 2012-06-20 DIAGNOSIS — L02419 Cutaneous abscess of limb, unspecified: Secondary | ICD-10-CM | POA: Diagnosis not present

## 2012-06-20 DIAGNOSIS — L03115 Cellulitis of right lower limb: Secondary | ICD-10-CM

## 2012-06-20 MED ORDER — CEPHALEXIN 250 MG PO CAPS: 250.0000 mg | ORAL_CAPSULE | Freq: Four times a day (QID) | ORAL | Status: DC

## 2012-06-20 NOTE — Progress Notes (Signed)
Subjective:    Patient ID: Tracy Sharp, female    DOB: 01-18-1925, 79 y.o.   MRN: [id number]  HPI  Pt presents to the clinic today with c/o redness and pain on her right shin. This started a few days ago. She does not remember having an injury to the area. It is tender to touch. She does occasionally get cellulitis of her lower extremities. She has not taken anything for the pain or put anything on it. It has not been draining.  Review of Systems      Past Medical History  Diagnosis Date  . Overweight   . Chest pain, unspecified   . Dyspnea   . CAD (coronary artery disease)     cardiac cath '07 - no obstructive disease  . Hyperlipidemia   . Hypertension   . Gait disturbance   . Gastritis   . Osteoarthritis   . Anxiety   . Spasm of muscle   . Leg pain, left   . Bilateral shoulder pain   . Mixed incontinence urge and stress (female)(female)     irritibile bladder  . Osteoporosis   . Phlebitis     one leg  . IBS (irritable bowel syndrome)   . Umbilical hernia   . Depression     Current Outpatient Prescriptions  Medication Sig Dispense Refill  . acetaminophen (TYLENOL) 650 MG CR tablet Take 1,300 mg by mouth 2 (two) times daily.      Marland Kitchen aspirin 81 MG tablet Take 81 mg by mouth daily.        . Calcium Polycarbophil (FIBER LAXATIVE PO) Take by mouth daily.        . Calcium-Vitamin D (CALTRATE 600 PLUS-VIT D PO) Take 1 tablet by mouth daily.        . cholecalciferol (VITAMIN D) 1000 UNITS tablet Take 1,000 Units by mouth daily.        . meloxicam (MOBIC) 15 MG tablet Daily.      . multivitamin (THERAGRAN) per tablet Take 1 tablet by mouth daily.        . sertraline (ZOLOFT) 50 MG tablet TAKE 1 TABLET (50 MG TOTAL) BY MOUTH DAILY.  90 tablet  1  . traMADol (ULTRAM) 50 MG tablet Take 50 mg by mouth every 6 (six) hours as needed.      . cephALEXin (KEFLEX) 250 MG capsule Take 1 capsule (250 mg total) by mouth 4 (four) times daily.  20 capsule  0   No current  facility-administered medications for this visit.    No Known Allergies  Family History  Problem Relation Age of Onset  . Heart attack Mother   . Diabetes Mother   . Heart disease Mother     CAD/MI  . Diabetes Sister   . Heart disease Sister     CAD/MI  . Diabetes Brother   . Diabetes Brother   . Diabetes Brother   . Cancer Sister     intestinal vs lung cancer  . Cancer Brother     stomach  . Alcohol abuse Brother     cirrhosis    History   Social History  . Marital Status: Married    Spouse Name: N/A    Number of Children: N/A  . Years of Education: 8   Occupational History  . Scientist, product/process development     retired   Social History Main Topics  . Smoking status: Never Smoker   . Smokeless tobacco: Never Used  . Alcohol Use: No  .  Drug Use: No  . Sexually Active: Not Currently   Other Topics Concern  . Not on file   Social History Narrative   *th grade. Married '49. 1 son '50; 1 dtr '56; 3 grandchildren, 2 great-grands. Lives with SO in their own home. End of life care (May '12): no CPR, no prolonged mechanical ventilation, No HD, no futile or prolonged heroic measures.     Constitutional: Denies fever, malaise, fatigue, headache or abrupt weight changes. . Musculoskeletal: Denies decrease in range of motion, difficulty with gait, muscle pain or joint pain and swelling.  Skin: Pt reports redness and pain of right shin. Denies rashes, lesions or ulcercations.     No other specific complaints in a complete review of systems (except as listed in HPI above).  Objective:   Physical Exam  BP 142/62  Pulse 72  Temp(Src) 96.8 F (36 C) (Oral)  Ht 5\' 4"  (1.626 m)  Wt 162 lb (73.483 kg)  BMI 27.79 kg/m2  SpO2 99% Wt Readings from Last 3 Encounters:  06/20/12 162 lb (73.483 kg)  06/13/12 157 lb (71.215 kg)  01/20/12 150 lb 12 oz (68.38 kg)    General: Appears her stated age, well developed, well nourished in NAD. Skin: Warm, dry and intact. Prominent inflamed  superficial vessel noted on right shin surrounded by area of cellulitis. Cardiovascular: Normal rate and rhythm. S1,S2 noted.  No murmur, rubs or gallops noted. No JVD or BLE edema. No carotid bruits noted. Pulmonary/Chest: Normal effort and positive vesicular breath sounds. No respiratory distress. No wheezes, rales or ronchi noted.  Musculoskeletal: Normal range of motion. No signs of joint swelling. No difficulty with gait.         Assessment & Plan:   Cellulitis and phlebitis of right shin, new onset:  eRx for Keflex x 5 days  RTC as needed or if symptoms persist or worsen

## 2012-06-20 NOTE — Patient Instructions (Signed)
Cellulitis Cellulitis is an infection of the skin and the tissue beneath it. The infected area is usually red and tender. Cellulitis occurs most often in the arms and lower legs.  CAUSES  Cellulitis is caused by bacteria that enter the skin through cracks or cuts in the skin. The most common types of bacteria that cause cellulitis are Staphylococcus and Streptococcus. SYMPTOMS   Redness and warmth.  Swelling.  Tenderness or pain.  Fever. DIAGNOSIS  Your caregiver can usually determine what is wrong based on a physical exam. Blood tests may also be done. TREATMENT  Treatment usually involves taking an antibiotic medicine. HOME CARE INSTRUCTIONS   Take your antibiotics as directed. Finish them even if you start to feel better.  Keep the infected arm or leg elevated to reduce swelling.  Apply a warm cloth to the affected area up to 4 times per day to relieve pain.  Only take over-the-counter or prescription medicines for pain, discomfort, or fever as directed by your caregiver.  Keep all follow-up appointments as directed by your caregiver. SEEK MEDICAL CARE IF:   You notice red streaks coming from the infected area.  Your red area gets larger or turns dark in color.  Your bone or joint underneath the infected area becomes painful after the skin has healed.  Your infection returns in the same area or another area.  You notice a swollen bump in the infected area.  You develop new symptoms. SEEK IMMEDIATE MEDICAL CARE IF:   You have a fever.  You feel very sleepy.  You develop vomiting or diarrhea.  You have a general ill feeling (malaise) with muscle aches and pains. MAKE SURE YOU:   Understand these instructions.  Will watch your condition.  Will get help right away if you are not doing well or get worse. Document Released: 10/27/2004 Document Revised: 07/19/2011 Document Reviewed: 04/04/2011 Exit[employer] Patient Information 2014 [employer].  

## 2012-07-06 DIAGNOSIS — N3941 Urge incontinence: Secondary | ICD-10-CM | POA: Diagnosis not present

## 2012-07-11 ENCOUNTER — Encounter: Payer: Self-pay | Admitting: Internal Medicine

## 2012-07-11 ENCOUNTER — Ambulatory Visit (INDEPENDENT_AMBULATORY_CARE_PROVIDER_SITE_OTHER): Payer: Medicare Other | Admitting: Internal Medicine

## 2012-07-11 VITALS — BP 130/76 | HR 74 | Temp 97.5°F | Wt 156.0 lb

## 2012-07-11 DIAGNOSIS — Z5189 Encounter for other specified aftercare: Secondary | ICD-10-CM | POA: Diagnosis not present

## 2012-07-11 DIAGNOSIS — I83893 Varicose veins of bilateral lower extremities with other complications: Secondary | ICD-10-CM

## 2012-07-11 DIAGNOSIS — I8390 Asymptomatic varicose veins of unspecified lower extremity: Secondary | ICD-10-CM | POA: Insufficient documentation

## 2012-07-11 DIAGNOSIS — S0083XD Contusion of other part of head, subsequent encounter: Secondary | ICD-10-CM

## 2012-07-11 DIAGNOSIS — I809 Phlebitis and thrombophlebitis of unspecified site: Secondary | ICD-10-CM | POA: Diagnosis not present

## 2012-07-11 MED ORDER — MELATONIN 3 MG PO TABS: 3.0000 mg | ORAL_TABLET | Freq: Every day | ORAL | Status: DC

## 2012-07-11 NOTE — Patient Instructions (Addendum)
1. Hematoma - head looks better. Keep that appointment with Dr. Shon Hough for possible evacuation of residual organized hematoma  2. Cellulitis/phlebitis leg looks healed today.  3. Varicose veins both ropey and small superficial. You can be more comfortable and reduce the risk of phlebitis by using knee high support hose.

## 2012-07-11 NOTE — Progress Notes (Signed)
  Subjective:    Patient ID: Tracy Sharp, female    DOB: Apr 12, 1924, 79 y.o.   MRN: [id number]  HPI Tracy Sharp had a bad fall striking her forehead resulting in a very large hematoma over the left eye, better now, but she did have dissection of blood to face and neck. She was seen at [facility] and xray and CT scan was clear. She has been referred to Dr. Carleene Cooper for potential evacuation of residual, organized hematoma.  She was seen May 21 by Tracy Sharp for cellulitis/phlebitis distal right leg. Rx Keflex x 5 days with good results although it is still a little sore.   PMH, FamHx and SocHx reviewed for any changes and relevance. Current Outpatient Prescriptions on File Prior to Visit  Medication Sig Dispense Refill  . acetaminophen (TYLENOL) 650 MG CR tablet Take 1,300 mg by mouth 2 (two) times daily.      Marland Kitchen aspirin 81 MG tablet Take 81 mg by mouth daily.        . Calcium Polycarbophil (FIBER LAXATIVE PO) Take by mouth daily.        . Calcium-Vitamin D (CALTRATE 600 PLUS-VIT D PO) Take 1 tablet by mouth daily.        . cholecalciferol (VITAMIN D) 1000 UNITS tablet Take 1,000 Units by mouth daily.        . meloxicam (MOBIC) 15 MG tablet Daily.      . multivitamin (THERAGRAN) per tablet Take 1 tablet by mouth daily.        . sertraline (ZOLOFT) 50 MG tablet TAKE 1 TABLET (50 MG TOTAL) BY MOUTH DAILY.  90 tablet  1  . traMADol (ULTRAM) 50 MG tablet Take 50 mg by mouth every 6 (six) hours as needed.       No current facility-administered medications on file prior to visit.      Review of Systems System review is negative for any constitutional, cardiac, pulmonary, GI or neuro symptoms or complaints other than as described in the HPI.     Objective:   Physical Exam Filed Vitals:   07/11/12 1455  BP: 130/76  Pulse: 74  Temp: 97.5 F (36.4 C)   Elderly overweight white woman in no distress HEENT- C&S clear, 3 cm hematoma over left eye Cor- RRR Pulm - normal  respirations Derm - right leg with large and small varices. Distal aspect with minimal rubor, no calore, no frank tenderness.       Assessment & Plan:  1. Phlebitis - resolved  2. Hematoma - patient has appointment end of June to see Dr. Shon Hough for possible evacuation of hematoma

## 2012-07-11 NOTE — Assessment & Plan Note (Signed)
Phlebitis resolved  Plan  to reduce risk of infection and hemorrhage knee high support hose are recommended.

## 2012-07-25 DIAGNOSIS — S1093XA Contusion of unspecified part of neck, initial encounter: Secondary | ICD-10-CM | POA: Diagnosis not present

## 2012-07-25 DIAGNOSIS — S0003XA Contusion of scalp, initial encounter: Secondary | ICD-10-CM | POA: Diagnosis not present

## 2012-08-17 DIAGNOSIS — N3941 Urge incontinence: Secondary | ICD-10-CM | POA: Diagnosis not present

## 2012-08-24 ENCOUNTER — Ambulatory Visit (INDEPENDENT_AMBULATORY_CARE_PROVIDER_SITE_OTHER): Payer: Medicare Other | Admitting: Internal Medicine

## 2012-08-24 ENCOUNTER — Ambulatory Visit (INDEPENDENT_AMBULATORY_CARE_PROVIDER_SITE_OTHER)
Admission: RE | Admit: 2012-08-24 | Discharge: 2012-08-24 | Disposition: A | Discharge status: Home / Self Care | Payer: Medicare Other | Source: Ambulatory Visit | Attending: Internal Medicine | Admitting: Internal Medicine

## 2012-08-24 ENCOUNTER — Encounter: Payer: Self-pay | Admitting: Internal Medicine

## 2012-08-24 VITALS — BP 122/62 | HR 69 | Temp 97.8°F | Wt 159.1 lb

## 2012-08-24 DIAGNOSIS — M25819 Other specified joint disorders, unspecified shoulder: Secondary | ICD-10-CM

## 2012-08-24 DIAGNOSIS — M7542 Impingement syndrome of left shoulder: Secondary | ICD-10-CM

## 2012-08-24 DIAGNOSIS — M25519 Pain in unspecified shoulder: Secondary | ICD-10-CM

## 2012-08-24 DIAGNOSIS — M25512 Pain in left shoulder: Secondary | ICD-10-CM

## 2012-08-24 DIAGNOSIS — S4980XA Other specified injuries of shoulder and upper arm, unspecified arm, initial encounter: Secondary | ICD-10-CM | POA: Diagnosis not present

## 2012-08-24 NOTE — Patient Instructions (Signed)
It was good to see you today. We have reviewed your prior records including labs and tests today Test(s) ordered today. Your results will be released to MyChart (or called to you) after review, usually within 72hours after test completion. If any changes need to be made, you will be notified at that same time. Take Tylenol 500mg  with tramadol 50mg  together 3x/day for 2 weeks to control shoulder pain If orthopedic evaluation is felt to be necessary after review of x-ray, we will refer you for same

## 2012-08-24 NOTE — Progress Notes (Signed)
  Subjective:    Patient ID: Tracy Sharp, female    DOB: 04-17-1924, 79 y.o.   MRN: [id number]  HPI  see CC - complains of left shoulder pain, ongoing since 08/03/12 fall Bruising posterior L shoulder/axilla noted by family last week  Past Medical History  Diagnosis Date  . Overweight(278.02)   . CAD (coronary artery disease)     cardiac cath '07 - no obstructive disease  . Hyperlipidemia   . Hypertension   . Gait disturbance   . Gastritis   . Osteoarthritis   . Anxiety   . Bilateral shoulder pain   . Mixed incontinence urge and stress (female)(female)     irritibile bladder  . Osteoporosis   . Phlebitis     one leg  . IBS (irritable bowel syndrome)   . Umbilical hernia   . Depression     Review of Systems  Musculoskeletal: Positive for gait problem. Negative for back pain and joint swelling.       Objective:   Physical Exam BP 122/62  Pulse 69  Temp(Src) 97.8 F (36.6 C) (Oral)  Wt 159 lb 1.9 oz (72.176 kg)  BMI 27.3 kg/m2  SpO2 98% Wt Readings from Last 3 Encounters:  08/24/12 159 lb 1.9 oz (72.176 kg)  07/11/12 156 lb (70.761 kg)  06/20/12 162 lb (73.483 kg)   Constitutional: She appears well-developed and well-nourished. No distress. dtr bonnie at side Neck: Normal range of motion. Neck supple. No JVD present. No thyromegaly present.  Cardiovascular: Normal rate, regular rhythm and normal heart sounds.  No murmur heard. No BLE edema. Pulmonary/Chest: Effort normal and breath sounds normal. No respiratory distress. She has no wheezes.  Musculoskeletal: L Shoulder: no gross deformity. Full passive range of motion. Neurovascularly intact distally. Good strength with stress of rotator cuff but causes pain. Positive impingement signs. Skin: bruising posterior left shoulder -Skin is warm and dry. No rash noted. No erythema.  Psychiatric: She has a normal mood and affect. Her behavior is normal. Judgment and thought content normal.   Lab Results  Component Value  Date   WBC 7.2 06/13/2012   HGB 13.3 06/13/2012   HCT 39.3 06/13/2012   PLT 247.0 06/13/2012   GLUCOSE 90 12/05/2011   CHOL 169 12/09/2009   TRIG 113.0 12/09/2009   HDL 53.40 12/09/2009   LDLDIRECT 140.1 11/03/2006   LDLCALC 93 12/09/2009   ALT 14 12/09/2009   AST 17 12/09/2009   NA 138 12/05/2011   K 3.6 12/05/2011   CL 102 12/05/2011   CREATININE 0.7 12/05/2011   BUN 18 12/05/2011   CO2 29 12/05/2011   TSH 0.87 12/09/2009   INR 1.1* 06/13/2012       Assessment & Plan:   L shoulder pain s/p fall -  exam with bruising and pain on stress of RTC - suspect partial tear and or hum fx Check DG -?need ortho input Continue pain control with tylenol + tramadol scheduled TID

## 2012-08-29 ENCOUNTER — Ambulatory Visit (INDEPENDENT_AMBULATORY_CARE_PROVIDER_SITE_OTHER): Payer: Medicare Other | Admitting: Internal Medicine

## 2012-08-29 ENCOUNTER — Encounter: Payer: Self-pay | Admitting: Internal Medicine

## 2012-08-29 VITALS — BP 126/80 | HR 60 | Temp 95.9°F | Wt 159.0 lb

## 2012-08-29 DIAGNOSIS — M25519 Pain in unspecified shoulder: Secondary | ICD-10-CM

## 2012-08-29 DIAGNOSIS — M7150 Other bursitis, not elsewhere classified, unspecified site: Secondary | ICD-10-CM | POA: Diagnosis not present

## 2012-08-29 DIAGNOSIS — M719 Bursopathy, unspecified: Secondary | ICD-10-CM

## 2012-08-29 DIAGNOSIS — M25512 Pain in left shoulder: Secondary | ICD-10-CM

## 2012-08-29 MED ORDER — METHYLPREDNISOLONE ACETATE 40 MG/ML IJ SUSP
40.0000 mg | Freq: Once | INTRAMUSCULAR | Status: AC
  Administered 2012-08-29: 40 mg via INTRA_ARTICULAR

## 2012-08-29 NOTE — Patient Instructions (Addendum)
Left Shoulder pain - x-ray showed no fracture. Suspect Bursitis after trauma vs torn rotator cuff.  Did a steroid injection mixed with numbing medicine. The numbing medicine wears of in 1-2 hours, the steroid takes effect in 6-8 hrs and lasts for many days.  If you do not see significant improvement you need to call - will refer you back to Dr. Dion Saucier for an evaluation for a torn rotator cuff.

## 2012-08-30 ENCOUNTER — Telehealth: Payer: Self-pay | Admitting: *Deleted

## 2012-08-30 NOTE — Telephone Encounter (Signed)
Called states since Pt has been awake today she has been pain free in her left shoulder.  She is doing the ROM as directed pain free.  Will notify you if pain returns.

## 2012-08-31 ENCOUNTER — Telehealth: Payer: Self-pay | Admitting: *Deleted

## 2012-08-31 DIAGNOSIS — M25512 Pain in left shoulder: Secondary | ICD-10-CM

## 2012-08-31 NOTE — Telephone Encounter (Signed)
Left msg on VM of MD notes.

## 2012-08-31 NOTE — Telephone Encounter (Signed)
Kendal Hymen called states pt experienced shoulder pain during the night last night.  She is requesting a referral to Ortho.  Please advise

## 2012-08-31 NOTE — Telephone Encounter (Signed)
Referral request to [facility] to Dr. Turner Daniels who she has seen before.

## 2012-09-02 NOTE — Progress Notes (Signed)
Subjective:    Patient ID: Tracy Sharp, female    DOB: 30-Jul-1924, 79 y.o.   MRN: [id number]  HPI Tracy Sharp presents for persistent left shoulder pain that began after a fall. She was seen by Dr. Felicity Coyer July 25th. X-ray was ordered: IMPRESSION: Osteoarthritic change. Calcific tendinosis. No  fracture or dislocation. She has continued to have pain and decreased range of motion. APAP has not been helpful.  PMH, FamHx and SocHx reviewed for any changes and relevance.   Current Outpatient Prescriptions on File Prior to Visit  Medication Sig Dispense Refill  . acetaminophen (TYLENOL) 500 MG tablet Take 500 mg by mouth every 6 (six) hours as needed for pain.      . Artificial Tear Ointment (EYE LUBRICANT OP) Apply to eye at bedtime.      Marland Kitchen aspirin 81 MG tablet Take 81 mg by mouth daily.        . Calcium Polycarbophil (FIBER LAXATIVE PO) Take by mouth daily.        . Calcium-Vitamin D (CALTRATE 600 PLUS-VIT D PO) Take 1 tablet by mouth daily.        . cholecalciferol (VITAMIN D) 1000 UNITS tablet Take 2,000 Units by mouth daily.       Marland Kitchen CRANBERRY EXTRACT PO Take 2 capsules by mouth daily.      . meloxicam (MOBIC) 15 MG tablet Daily.      . Multiple Vitamins-Minerals (OCUVITE ADULT FORMULA) CAPS Take by mouth daily.      . multivitamin (THERAGRAN) per tablet Take 1 tablet by mouth daily.        . NON FORMULARY Severe dry eyes gel drops 2-3 drops three time a day      . Nutritional Supplements (RA MELATONIN/B-6 PO) Take by mouth. Take 2 at bedtime      . sertraline (ZOLOFT) 50 MG tablet TAKE 1 TABLET (50 MG TOTAL) BY MOUTH DAILY.  90 tablet  1  . traMADol (ULTRAM) 50 MG tablet Take 50 mg by mouth every 6 (six) hours as needed.       No current facility-administered medications on file prior to visit.      Review of Systems System review is negative for any constitutional, cardiac, pulmonary, GI or neuro symptoms or complaints other than as described in the HPI.     Objective:   Physical Exam Filed Vitals:   08/29/12 1406  BP: 126/80  Pulse: 60  Temp: 95.9 F (35.5 C)   gen'l- elderly white woman in no acute distress COr-  RRR PUlm - normal respirations. MSK - no crepitus with passive ROM left shoulder. Active ROM is limited in extension and adduction by pain.  Procedure Joint/bursal injection Location - left shoulder Indication - localized pain Consent - informed verbal consent from patient after explanation of risks of bleeding and infection Prep - injection site identified, prepped with betadine followed by alcohol. Med -  40 Mg depomedrol with 0.5 Cc 2% xylocain Injection - bursa/joint space entered easily. Injected without difficulty. Patient tolerated this well. Post-procedure - patient with rapid reduction in discomfort. Bandaid applied. Routine precautions provided including instruction to return for fever, drainage or increased pain        Assessment & Plan:  Shoulder pain - bursitis vs calcific tendonitis vs rotator cuff tear. She did get relief with cortisone injection.  Plan Gentle ROM exercise  For continued pain - ortho referrall  Addendum: patient's daughter called back due to continued pain and decreased ROM Plan  Referral to Dr. Turner Daniels.

## 2012-09-04 DIAGNOSIS — R29818 Other symptoms and signs involving the nervous system: Secondary | ICD-10-CM | POA: Diagnosis not present

## 2012-09-04 DIAGNOSIS — M2550 Pain in unspecified joint: Secondary | ICD-10-CM | POA: Diagnosis not present

## 2012-09-04 DIAGNOSIS — R42 Dizziness and giddiness: Secondary | ICD-10-CM | POA: Diagnosis not present

## 2012-09-04 DIAGNOSIS — M1990 Unspecified osteoarthritis, unspecified site: Secondary | ICD-10-CM | POA: Diagnosis not present

## 2012-09-05 DIAGNOSIS — H04129 Dry eye syndrome of unspecified lacrimal gland: Secondary | ICD-10-CM | POA: Diagnosis not present

## 2012-09-05 DIAGNOSIS — Z961 Presence of intraocular lens: Secondary | ICD-10-CM | POA: Diagnosis not present

## 2012-09-05 DIAGNOSIS — H5310 Unspecified subjective visual disturbances: Secondary | ICD-10-CM | POA: Diagnosis not present

## 2012-09-05 DIAGNOSIS — H43819 Vitreous degeneration, unspecified eye: Secondary | ICD-10-CM | POA: Diagnosis not present

## 2012-09-06 DIAGNOSIS — M719 Bursopathy, unspecified: Secondary | ICD-10-CM | POA: Diagnosis not present

## 2012-09-07 DIAGNOSIS — N3941 Urge incontinence: Secondary | ICD-10-CM | POA: Diagnosis not present

## 2012-09-20 DIAGNOSIS — M25519 Pain in unspecified shoulder: Secondary | ICD-10-CM | POA: Diagnosis not present

## 2012-09-20 DIAGNOSIS — M67919 Unspecified disorder of synovium and tendon, unspecified shoulder: Secondary | ICD-10-CM | POA: Diagnosis not present

## 2012-09-20 DIAGNOSIS — M25619 Stiffness of unspecified shoulder, not elsewhere classified: Secondary | ICD-10-CM | POA: Diagnosis not present

## 2012-09-20 DIAGNOSIS — M6281 Muscle weakness (generalized): Secondary | ICD-10-CM | POA: Diagnosis not present

## 2012-09-21 ENCOUNTER — Telehealth: Payer: Self-pay | Admitting: *Deleted

## 2012-09-21 NOTE — Telephone Encounter (Signed)
Pt's daughter called and lvm requesting an rx to go with her sertraline or to increase the dosage. Pt stays upset and is crying a lot.

## 2012-09-22 MED ORDER — SERTRALINE HCL 100 MG PO TABS: ORAL_TABLET | ORAL | Status: DC

## 2012-09-22 NOTE — Telephone Encounter (Signed)
Called Tracy Sharp: problem is increased tearfullness, worry and indecisiveness but also more dwelling in the past, more paranoia.  PLan  increase sertraline to 100 mg qHS  Watch for somnolence akesthesia  May need reeval for demential with paranoid features.

## 2012-09-25 DIAGNOSIS — M67919 Unspecified disorder of synovium and tendon, unspecified shoulder: Secondary | ICD-10-CM | POA: Diagnosis not present

## 2012-09-25 DIAGNOSIS — M25519 Pain in unspecified shoulder: Secondary | ICD-10-CM | POA: Diagnosis not present

## 2012-09-25 DIAGNOSIS — M25619 Stiffness of unspecified shoulder, not elsewhere classified: Secondary | ICD-10-CM | POA: Diagnosis not present

## 2012-09-27 DIAGNOSIS — M25619 Stiffness of unspecified shoulder, not elsewhere classified: Secondary | ICD-10-CM | POA: Diagnosis not present

## 2012-09-27 DIAGNOSIS — M67919 Unspecified disorder of synovium and tendon, unspecified shoulder: Secondary | ICD-10-CM | POA: Diagnosis not present

## 2012-09-27 DIAGNOSIS — M25519 Pain in unspecified shoulder: Secondary | ICD-10-CM | POA: Diagnosis not present

## 2012-09-28 DIAGNOSIS — N3941 Urge incontinence: Secondary | ICD-10-CM | POA: Diagnosis not present

## 2012-10-02 DIAGNOSIS — M25619 Stiffness of unspecified shoulder, not elsewhere classified: Secondary | ICD-10-CM | POA: Diagnosis not present

## 2012-10-02 DIAGNOSIS — M25519 Pain in unspecified shoulder: Secondary | ICD-10-CM | POA: Diagnosis not present

## 2012-10-04 DIAGNOSIS — M25519 Pain in unspecified shoulder: Secondary | ICD-10-CM | POA: Diagnosis not present

## 2012-10-04 DIAGNOSIS — M67919 Unspecified disorder of synovium and tendon, unspecified shoulder: Secondary | ICD-10-CM | POA: Diagnosis not present

## 2012-10-04 DIAGNOSIS — M25619 Stiffness of unspecified shoulder, not elsewhere classified: Secondary | ICD-10-CM | POA: Diagnosis not present

## 2012-10-19 DIAGNOSIS — N3941 Urge incontinence: Secondary | ICD-10-CM | POA: Diagnosis not present

## 2012-11-09 DIAGNOSIS — N3941 Urge incontinence: Secondary | ICD-10-CM | POA: Diagnosis not present

## 2012-11-13 DIAGNOSIS — Z23 Encounter for immunization: Secondary | ICD-10-CM | POA: Diagnosis not present

## 2012-11-30 DIAGNOSIS — D2370 Other benign neoplasm of skin of unspecified lower limb, including hip: Secondary | ICD-10-CM | POA: Diagnosis not present

## 2012-11-30 DIAGNOSIS — N3941 Urge incontinence: Secondary | ICD-10-CM | POA: Diagnosis not present

## 2012-12-05 DIAGNOSIS — M1990 Unspecified osteoarthritis, unspecified site: Secondary | ICD-10-CM | POA: Diagnosis not present

## 2012-12-05 DIAGNOSIS — M2550 Pain in unspecified joint: Secondary | ICD-10-CM | POA: Diagnosis not present

## 2012-12-05 DIAGNOSIS — Z791 Long term (current) use of non-steroidal anti-inflammatories (NSAID): Secondary | ICD-10-CM | POA: Diagnosis not present

## 2012-12-05 DIAGNOSIS — H04129 Dry eye syndrome of unspecified lacrimal gland: Secondary | ICD-10-CM | POA: Diagnosis not present

## 2012-12-05 DIAGNOSIS — H6120 Impacted cerumen, unspecified ear: Secondary | ICD-10-CM | POA: Diagnosis not present

## 2012-12-05 DIAGNOSIS — H01119 Allergic dermatitis of unspecified eye, unspecified eyelid: Secondary | ICD-10-CM | POA: Diagnosis not present

## 2012-12-05 DIAGNOSIS — R29818 Other symptoms and signs involving the nervous system: Secondary | ICD-10-CM | POA: Diagnosis not present

## 2012-12-06 ENCOUNTER — Other Ambulatory Visit: Payer: Self-pay

## 2012-12-19 DIAGNOSIS — H04129 Dry eye syndrome of unspecified lacrimal gland: Secondary | ICD-10-CM | POA: Diagnosis not present

## 2012-12-21 DIAGNOSIS — N3941 Urge incontinence: Secondary | ICD-10-CM | POA: Diagnosis not present

## 2013-01-11 DIAGNOSIS — N3941 Urge incontinence: Secondary | ICD-10-CM | POA: Diagnosis not present

## 2013-01-11 DIAGNOSIS — R350 Frequency of micturition: Secondary | ICD-10-CM | POA: Diagnosis not present

## 2013-02-01 DIAGNOSIS — N3941 Urge incontinence: Secondary | ICD-10-CM | POA: Diagnosis not present

## 2013-02-05 ENCOUNTER — Telehealth: Payer: Self-pay | Admitting: Internal Medicine

## 2013-02-05 NOTE — Telephone Encounter (Signed)
Rec'd from [facility] forward 3 pages to Dr. Norins °

## 2013-02-22 DIAGNOSIS — N3941 Urge incontinence: Secondary | ICD-10-CM | POA: Diagnosis not present

## 2013-03-29 ENCOUNTER — Other Ambulatory Visit: Payer: Self-pay | Admitting: Internal Medicine

## 2013-04-05 DIAGNOSIS — N3941 Urge incontinence: Secondary | ICD-10-CM | POA: Diagnosis not present

## 2013-04-09 DIAGNOSIS — H1045 Other chronic allergic conjunctivitis: Secondary | ICD-10-CM | POA: Diagnosis not present

## 2013-04-26 DIAGNOSIS — N3941 Urge incontinence: Secondary | ICD-10-CM | POA: Diagnosis not present

## 2013-05-17 DIAGNOSIS — N3941 Urge incontinence: Secondary | ICD-10-CM | POA: Diagnosis not present

## 2013-06-04 DIAGNOSIS — M25519 Pain in unspecified shoulder: Secondary | ICD-10-CM | POA: Diagnosis not present

## 2013-06-04 DIAGNOSIS — R29818 Other symptoms and signs involving the nervous system: Secondary | ICD-10-CM | POA: Diagnosis not present

## 2013-06-04 DIAGNOSIS — M2550 Pain in unspecified joint: Secondary | ICD-10-CM | POA: Diagnosis not present

## 2013-06-04 DIAGNOSIS — M1990 Unspecified osteoarthritis, unspecified site: Secondary | ICD-10-CM | POA: Diagnosis not present

## 2013-06-07 DIAGNOSIS — N3941 Urge incontinence: Secondary | ICD-10-CM | POA: Diagnosis not present

## 2013-06-20 DIAGNOSIS — F329 Major depressive disorder, single episode, unspecified: Secondary | ICD-10-CM | POA: Diagnosis not present

## 2013-06-20 DIAGNOSIS — M1990 Unspecified osteoarthritis, unspecified site: Secondary | ICD-10-CM | POA: Diagnosis not present

## 2013-06-20 DIAGNOSIS — Z791 Long term (current) use of non-steroidal anti-inflammatories (NSAID): Secondary | ICD-10-CM | POA: Diagnosis not present

## 2013-06-20 DIAGNOSIS — N318 Other neuromuscular dysfunction of bladder: Secondary | ICD-10-CM | POA: Diagnosis not present

## 2013-06-20 DIAGNOSIS — F411 Generalized anxiety disorder: Secondary | ICD-10-CM | POA: Diagnosis not present

## 2013-06-28 DIAGNOSIS — N3941 Urge incontinence: Secondary | ICD-10-CM | POA: Diagnosis not present

## 2013-07-16 DIAGNOSIS — M7750 Other enthesopathy of unspecified foot: Secondary | ICD-10-CM | POA: Diagnosis not present

## 2013-07-16 DIAGNOSIS — M12279 Villonodular synovitis (pigmented), unspecified ankle and foot: Secondary | ICD-10-CM | POA: Diagnosis not present

## 2013-07-16 DIAGNOSIS — IMO0002 Reserved for concepts with insufficient information to code with codable children: Secondary | ICD-10-CM | POA: Diagnosis not present

## 2013-07-17 DIAGNOSIS — F329 Major depressive disorder, single episode, unspecified: Secondary | ICD-10-CM | POA: Diagnosis not present

## 2013-07-18 DIAGNOSIS — N3946 Mixed incontinence: Secondary | ICD-10-CM | POA: Diagnosis not present

## 2013-07-18 DIAGNOSIS — R350 Frequency of micturition: Secondary | ICD-10-CM | POA: Diagnosis not present

## 2013-07-19 DIAGNOSIS — N3941 Urge incontinence: Secondary | ICD-10-CM | POA: Diagnosis not present

## 2013-07-29 ENCOUNTER — Other Ambulatory Visit: Payer: Self-pay

## 2013-07-29 MED ORDER — SERTRALINE HCL 100 MG PO TABS: ORAL_TABLET | ORAL | Status: DC

## 2013-07-29 NOTE — Telephone Encounter (Signed)
OK X1  Needs new PCP

## 2013-08-09 DIAGNOSIS — N3941 Urge incontinence: Secondary | ICD-10-CM | POA: Diagnosis not present

## 2013-10-30 DIAGNOSIS — F329 Major depressive disorder, single episode, unspecified: Secondary | ICD-10-CM | POA: Diagnosis not present

## 2013-10-30 DIAGNOSIS — R29818 Other symptoms and signs involving the nervous system: Secondary | ICD-10-CM | POA: Diagnosis not present

## 2013-10-30 DIAGNOSIS — K219 Gastro-esophageal reflux disease without esophagitis: Secondary | ICD-10-CM | POA: Diagnosis not present

## 2013-10-30 DIAGNOSIS — Z23 Encounter for immunization: Secondary | ICD-10-CM | POA: Diagnosis not present

## 2013-11-29 DIAGNOSIS — F324 Major depressive disorder, single episode, in partial remission: Secondary | ICD-10-CM | POA: Diagnosis not present

## 2013-11-29 DIAGNOSIS — M205X2 Other deformities of toe(s) (acquired), left foot: Secondary | ICD-10-CM | POA: Diagnosis not present

## 2013-11-29 DIAGNOSIS — K219 Gastro-esophageal reflux disease without esophagitis: Secondary | ICD-10-CM | POA: Diagnosis not present

## 2013-11-29 DIAGNOSIS — L84 Corns and callosities: Secondary | ICD-10-CM | POA: Diagnosis not present

## 2013-11-29 DIAGNOSIS — Z23 Encounter for immunization: Secondary | ICD-10-CM | POA: Diagnosis not present

## 2013-11-29 DIAGNOSIS — N3281 Overactive bladder: Secondary | ICD-10-CM | POA: Diagnosis not present

## 2013-12-06 DIAGNOSIS — H903 Sensorineural hearing loss, bilateral: Secondary | ICD-10-CM | POA: Diagnosis not present

## 2013-12-17 DIAGNOSIS — M25561 Pain in right knee: Secondary | ICD-10-CM | POA: Diagnosis not present

## 2013-12-17 DIAGNOSIS — M25572 Pain in left ankle and joints of left foot: Secondary | ICD-10-CM | POA: Diagnosis not present

## 2013-12-24 ENCOUNTER — Other Ambulatory Visit: Payer: Self-pay | Admitting: Orthopedic Surgery

## 2013-12-24 DIAGNOSIS — M25561 Pain in right knee: Secondary | ICD-10-CM | POA: Diagnosis not present

## 2013-12-24 DIAGNOSIS — M205X2 Other deformities of toe(s) (acquired), left foot: Secondary | ICD-10-CM | POA: Diagnosis not present

## 2013-12-24 DIAGNOSIS — Z96651 Presence of right artificial knee joint: Secondary | ICD-10-CM | POA: Diagnosis not present

## 2013-12-25 ENCOUNTER — Encounter (HOSPITAL_BASED_OUTPATIENT_CLINIC_OR_DEPARTMENT_OTHER): Payer: Self-pay | Admitting: *Deleted

## 2013-12-25 ENCOUNTER — Other Ambulatory Visit (HOSPITAL_COMMUNITY): Payer: Self-pay | Admitting: Orthopedic Surgery

## 2013-12-25 DIAGNOSIS — M25561 Pain in right knee: Secondary | ICD-10-CM

## 2013-12-25 NOTE — Progress Notes (Signed)
No labs  Pt has some dementia, but lives alone-close to son and daughter in law-they keep close eye on her-she uses a walker-is tearful and paranoid at times-
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
## 2014-01-01 DIAGNOSIS — M205X2 Other deformities of toe(s) (acquired), left foot: Secondary | ICD-10-CM | POA: Diagnosis not present

## 2014-01-01 NOTE — Progress Notes (Signed)
Surgery chg 12/2 to 12/4 Talked with bonnie

## 2014-01-03 ENCOUNTER — Encounter (HOSPITAL_BASED_OUTPATIENT_CLINIC_OR_DEPARTMENT_OTHER)
Admission: RE | Disposition: A | Discharge status: Home / Self Care | Payer: Self-pay | Source: Ambulatory Visit | Attending: Orthopedic Surgery

## 2014-01-03 ENCOUNTER — Ambulatory Visit (HOSPITAL_BASED_OUTPATIENT_CLINIC_OR_DEPARTMENT_OTHER): Payer: Medicare Other | Admitting: Anesthesiology

## 2014-01-03 ENCOUNTER — Encounter (HOSPITAL_BASED_OUTPATIENT_CLINIC_OR_DEPARTMENT_OTHER): Payer: Self-pay | Admitting: *Deleted

## 2014-01-03 ENCOUNTER — Ambulatory Visit (HOSPITAL_BASED_OUTPATIENT_CLINIC_OR_DEPARTMENT_OTHER)
Admission: RE | Admit: 2014-01-03 | Discharge: 2014-01-03 | Disposition: A | Discharge status: Home / Self Care | Payer: Medicare Other | Source: Ambulatory Visit | Attending: Orthopedic Surgery | Admitting: Orthopedic Surgery

## 2014-01-03 DIAGNOSIS — K589 Irritable bowel syndrome without diarrhea: Secondary | ICD-10-CM | POA: Diagnosis not present

## 2014-01-03 DIAGNOSIS — M19072 Primary osteoarthritis, left ankle and foot: Secondary | ICD-10-CM | POA: Diagnosis not present

## 2014-01-03 DIAGNOSIS — E669 Obesity, unspecified: Secondary | ICD-10-CM | POA: Diagnosis not present

## 2014-01-03 DIAGNOSIS — M21172 Varus deformity, not elsewhere classified, left ankle: Secondary | ICD-10-CM | POA: Insufficient documentation

## 2014-01-03 DIAGNOSIS — I10 Essential (primary) hypertension: Secondary | ICD-10-CM | POA: Diagnosis not present

## 2014-01-03 DIAGNOSIS — E785 Hyperlipidemia, unspecified: Secondary | ICD-10-CM | POA: Diagnosis not present

## 2014-01-03 DIAGNOSIS — F419 Anxiety disorder, unspecified: Secondary | ICD-10-CM | POA: Diagnosis not present

## 2014-01-03 DIAGNOSIS — Z89422 Acquired absence of other left toe(s): Secondary | ICD-10-CM

## 2014-01-03 DIAGNOSIS — F329 Major depressive disorder, single episode, unspecified: Secondary | ICD-10-CM | POA: Diagnosis not present

## 2014-01-03 DIAGNOSIS — N3946 Mixed incontinence: Secondary | ICD-10-CM | POA: Diagnosis not present

## 2014-01-03 DIAGNOSIS — M810 Age-related osteoporosis without current pathological fracture: Secondary | ICD-10-CM | POA: Diagnosis not present

## 2014-01-03 DIAGNOSIS — I2510 Atherosclerotic heart disease of native coronary artery without angina pectoris: Secondary | ICD-10-CM | POA: Diagnosis not present

## 2014-01-03 DIAGNOSIS — Z6830 Body mass index (BMI) 30.0-30.9, adult: Secondary | ICD-10-CM | POA: Insufficient documentation

## 2014-01-03 HISTORY — DX: Complete loss of teeth, unspecified cause, unspecified class: Z97.2

## 2014-01-03 HISTORY — DX: Presence of external hearing-aid: Z97.4

## 2014-01-03 HISTORY — DX: Presence of spectacles and contact lenses: Z97.3

## 2014-01-03 HISTORY — DX: Complete loss of teeth, unspecified cause, unspecified class: K08.109

## 2014-01-03 HISTORY — PX: AMPUTATION: SHX166

## 2014-01-03 LAB — POCT HEMOGLOBIN-HEMACUE: HEMOGLOBIN: 13.3 g/dL (ref 12.0–15.0)

## 2014-01-03 SURGERY — AMPUTATION, FOOT, RAY
Anesthesia: Monitor Anesthesia Care | Site: Foot | Laterality: Left

## 2014-01-03 MED ORDER — HYDROMORPHONE HCL 1 MG/ML IJ SOLN: 0.2500 mg | INTRAMUSCULAR | Status: DC | PRN

## 2014-01-03 MED ORDER — LIDOCAINE HCL (PF) 1 % IJ SOLN
INTRAMUSCULAR | Status: DC | PRN
  Administered 2014-01-03: 7.5 mL

## 2014-01-03 MED ORDER — BUPIVACAINE-EPINEPHRINE 0.5% -1:200000 IJ SOLN
INTRAMUSCULAR | Status: DC | PRN
  Administered 2014-01-03: 7.5 mL

## 2014-01-03 MED ORDER — DEXTROSE-NACL 5-0.45 % IV SOLN: INTRAVENOUS | Status: DC

## 2014-01-03 MED ORDER — OXYCODONE HCL 5 MG PO TABS: 5.0000 mg | ORAL_TABLET | Freq: Once | ORAL | Status: DC | PRN

## 2014-01-03 MED ORDER — CEFAZOLIN SODIUM-DEXTROSE 2-3 GM-% IV SOLR
2.0000 g | INTRAVENOUS | Status: AC
  Administered 2014-01-03: 2 g via INTRAVENOUS

## 2014-01-03 MED ORDER — MIDAZOLAM HCL 2 MG/2ML IJ SOLN: 1.0000 mg | INTRAMUSCULAR | Status: DC | PRN

## 2014-01-03 MED ORDER — OXYCODONE HCL 5 MG/5ML PO SOLN: 5.0000 mg | Freq: Once | ORAL | Status: DC | PRN

## 2014-01-03 MED ORDER — PROPOFOL 10 MG/ML IV BOLUS
INTRAVENOUS | Status: DC | PRN
  Administered 2014-01-03 (×2): 50 mg via INTRAVENOUS

## 2014-01-03 MED ORDER — ONDANSETRON HCL 4 MG/2ML IJ SOLN
INTRAMUSCULAR | Status: DC | PRN
  Administered 2014-01-03: 4 mg via INTRAVENOUS

## 2014-01-03 MED ORDER — FENTANYL CITRATE 0.05 MG/ML IJ SOLN
INTRAMUSCULAR | Status: DC | PRN
  Administered 2014-01-03 (×2): 50 ug via INTRAVENOUS

## 2014-01-03 MED ORDER — HYDROCODONE-ACETAMINOPHEN 5-325 MG PO TABS: ORAL_TABLET | ORAL | Status: DC

## 2014-01-03 MED ORDER — MEPERIDINE HCL 25 MG/ML IJ SOLN: 6.2500 mg | INTRAMUSCULAR | Status: DC | PRN

## 2014-01-03 MED ORDER — LACTATED RINGERS IV SOLN
INTRAVENOUS | Status: DC
  Administered 2014-01-03: 11:00:00 via INTRAVENOUS

## 2014-01-03 MED ORDER — FENTANYL CITRATE 0.05 MG/ML IJ SOLN: 50.0000 ug | INTRAMUSCULAR | Status: DC | PRN

## 2014-01-03 MED ORDER — CHLORHEXIDINE GLUCONATE 4 % EX LIQD: 60.0000 mL | Freq: Once | CUTANEOUS | Status: DC

## 2014-01-03 MED ORDER — ONDANSETRON HCL 4 MG/2ML IJ SOLN: 4.0000 mg | Freq: Once | INTRAMUSCULAR | Status: DC | PRN

## 2014-01-03 MED ORDER — PROPOFOL INFUSION 10 MG/ML OPTIME
INTRAVENOUS | Status: DC | PRN
Start: 2014-01-03 — End: 2014-01-03
  Administered 2014-01-03: 100 ug/kg/min via INTRAVENOUS

## 2014-01-03 SURGICAL SUPPLY — 65 items
BANDAGE ELASTIC 3 VELCRO ST LF (GAUZE/BANDAGES/DRESSINGS) ×3 IMPLANT
BANDAGE ELASTIC 4 VELCRO ST LF (GAUZE/BANDAGES/DRESSINGS) IMPLANT
BANDAGE ESMARK 6X9 LF (GAUZE/BANDAGES/DRESSINGS) IMPLANT
BLADE OSC/SAG .038X5.5 CUT EDG (BLADE) IMPLANT
BLADE SURG 10 STRL SS (BLADE) ×3 IMPLANT
BLADE SURG 15 STRL LF DISP TIS (BLADE) ×1 IMPLANT
BLADE SURG 15 STRL SS (BLADE) ×2
BNDG CMPR 9X4 STRL LF SNTH (GAUZE/BANDAGES/DRESSINGS)
BNDG CMPR 9X6 STRL LF SNTH (GAUZE/BANDAGES/DRESSINGS)
BNDG CONFORM 2 STRL LF (GAUZE/BANDAGES/DRESSINGS) ×3 IMPLANT
BNDG ESMARK 4X9 LF (GAUZE/BANDAGES/DRESSINGS) IMPLANT
BNDG ESMARK 6X9 LF (GAUZE/BANDAGES/DRESSINGS)
BOOT STEPPER DURA LG (SOFTGOODS) IMPLANT
BOOT STEPPER DURA MED (SOFTGOODS) IMPLANT
BOOT STEPPER DURA SM (SOFTGOODS) IMPLANT
COVER BACK TABLE 60X90IN (DRAPES) ×3 IMPLANT
COVER MAYO STAND STRL (DRAPES) ×3 IMPLANT
CUFF TOURNIQUET SINGLE 18IN (TOURNIQUET CUFF) IMPLANT
CUFF TOURNIQUET SINGLE 24IN (TOURNIQUET CUFF) IMPLANT
CUFF TOURNIQUET SINGLE 34IN LL (TOURNIQUET CUFF) IMPLANT
DECANTER SPIKE VIAL GLASS SM (MISCELLANEOUS) IMPLANT
DRAPE EXTREMITY T 121X128X90 (DRAPE) ×3 IMPLANT
DRAPE OEC MINIVIEW 54X84 (DRAPES) IMPLANT
DRAPE SURG 17X23 STRL (DRAPES) IMPLANT
DRAPE U 20/CS (DRAPES) ×3 IMPLANT
DRAPE U-SHAPE 47X51 STRL (DRAPES) IMPLANT
DURAPREP 26ML APPLICATOR (WOUND CARE) ×3 IMPLANT
ELECT REM PT RETURN 9FT ADLT (ELECTROSURGICAL) ×3
ELECTRODE REM PT RTRN 9FT ADLT (ELECTROSURGICAL) ×1 IMPLANT
GAUZE SPONGE 4X4 16PLY XRAY LF (GAUZE/BANDAGES/DRESSINGS) IMPLANT
GAUZE XEROFORM 1X8 LF (GAUZE/BANDAGES/DRESSINGS) ×3 IMPLANT
GLOVE BIO SURGEON STRL SZ7.5 (GLOVE) ×3 IMPLANT
GLOVE BIO SURGEON STRL SZ8.5 (GLOVE) ×3 IMPLANT
GLOVE BIOGEL PI IND STRL 7.0 (GLOVE) IMPLANT
GLOVE BIOGEL PI IND STRL 8 (GLOVE) ×1 IMPLANT
GLOVE BIOGEL PI IND STRL 9 (GLOVE) ×1 IMPLANT
GLOVE BIOGEL PI INDICATOR 7.0 (GLOVE)
GLOVE BIOGEL PI INDICATOR 8 (GLOVE) ×2
GLOVE BIOGEL PI INDICATOR 9 (GLOVE) ×2
GLOVE ECLIPSE 6.5 STRL STRAW (GLOVE) ×3 IMPLANT
GOWN STRL REUS W/ TWL LRG LVL3 (GOWN DISPOSABLE) ×1 IMPLANT
GOWN STRL REUS W/ TWL XL LVL3 (GOWN DISPOSABLE) ×2 IMPLANT
GOWN STRL REUS W/TWL LRG LVL3 (GOWN DISPOSABLE) ×3
GOWN STRL REUS W/TWL XL LVL3 (GOWN DISPOSABLE) ×6
NEEDLE HYPO 22GX1.5 SAFETY (NEEDLE) ×3 IMPLANT
NS IRRIG 1000ML POUR BTL (IV SOLUTION) ×3 IMPLANT
PACK BASIN DAY SURGERY FS (CUSTOM PROCEDURE TRAY) ×3 IMPLANT
PAD CAST 4YDX4 CTTN HI CHSV (CAST SUPPLIES) IMPLANT
PADDING CAST ABS 4INX4YD NS (CAST SUPPLIES)
PADDING CAST ABS COTTON 4X4 ST (CAST SUPPLIES) IMPLANT
PADDING CAST COTTON 4X4 STRL (CAST SUPPLIES)
PENCIL BUTTON HOLSTER BLD 10FT (ELECTRODE) ×3 IMPLANT
SHEET MEDIUM DRAPE 40X70 STRL (DRAPES) ×3 IMPLANT
SLEEVE SCD COMPRESS KNEE MED (MISCELLANEOUS) IMPLANT
SPONGE LAP 18X18 X RAY DECT (DISPOSABLE) IMPLANT
SPONGE LAP 4X18 X RAY DECT (DISPOSABLE) IMPLANT
SUT ETHILON 2 0 FS 18 (SUTURE) IMPLANT
SUT ETHILON 3 0 PS 1 (SUTURE) ×3 IMPLANT
SUT ETHILON 4 0 PS 2 18 (SUTURE) IMPLANT
SUT VIC AB 2-0 SH 27 (SUTURE)
SUT VIC AB 2-0 SH 27XBRD (SUTURE) IMPLANT
SYR BULB 3OZ (MISCELLANEOUS) ×3 IMPLANT
SYR CONTROL 10ML LL (SYRINGE) ×6 IMPLANT
TOWEL OR 17X24 6PK STRL BLUE (TOWEL DISPOSABLE) ×3 IMPLANT
UNDERPAD 30X30 INCONTINENT (UNDERPADS AND DIAPERS) ×3 IMPLANT

## 2014-01-03 NOTE — Anesthesia Postprocedure Evaluation (Signed)
Anesthesia Post Note  Patient: Tracy Sharp  Procedure(s) Performed: Procedure(s) (LRB): LEFT THIRD TOE AMPUTATION (Left)  Anesthesia type: general  Patient location: PACU  Post pain: Pain level controlled  Post assessment: Patient's Cardiovascular Status Stable  Last Vitals:  Filed Vitals:   01/03/14 1305  BP: 164/65  Pulse: 67  Temp: 36.4 C  Resp: 18    Post vital signs: Reviewed and stable  Level of consciousness: sedated  Complications: No apparent anesthesia complications

## 2014-01-03 NOTE — Discharge Instructions (Signed)

## 2014-01-03 NOTE — Anesthesia Preprocedure Evaluation (Signed)
Anesthesia Evaluation  Patient identified by MRN, date of birth, ID band Patient awake    Reviewed: Allergy & Precautions, H&P , NPO status , Patient's Chart, lab work & pertinent test results  Airway Mallampati: II  TM Distance: >3 FB Neck ROM: Full    Dental   Pulmonary          Cardiovascular hypertension, Pt. on medications     Neuro/Psych    GI/Hepatic   Endo/Other    Renal/GU      Musculoskeletal   Abdominal   Peds  Hematology   Anesthesia Other Findings   Reproductive/Obstetrics                             Anesthesia Physical Anesthesia Plan  ASA: II  Anesthesia Plan: MAC   Post-op Pain Management:    Induction: Intravenous  Airway Management Planned: Natural Airway  Additional Equipment:   Intra-op Plan:   Post-operative Plan:   Informed Consent: I have reviewed the patients History and Physical, chart, labs and discussed the procedure including the risks, benefits and alternatives for the proposed anesthesia with the patient or authorized representative who has indicated his/her understanding and acceptance.     Plan Discussed with: CRNA and Surgeon  Anesthesia Plan Comments:         Anesthesia Quick Evaluation

## 2014-01-03 NOTE — Op Note (Signed)
Pre Op Dx: Left third toe 90 varus angular deformity at the MTP joint  Post Op Dx: Same  Procedure: Left third toe MTP joint amputation  Surgeon: Kerin Salen, MD  Assistant: Kerry Hough. Barton Dubois  (present throughout entire procedure and necessary for timely completion of the procedure)  Anesthesia: Local with IV sedation  EBL: Minimal  Fluids: Of 100 mL of crystalloid  Tourniquet Time: None  Indications: 80 year old woman with a greater than 40 year history of the left third toe having a 90 varus deformity at the MTP joint overlapping the second toe. The toes now becoming swollen and painful and interferes with shoe wear she would like to have the toe removed at the MTP joint risks and benefits of surgery discussed and questions answered.  Procedure: Patient identified by arm band and received IV antibiotics in the holding area at [facility]. Taken to operating room for appropriate anesthetic monitors were attached and IV sedation was administered. The dorsum of the foot was prepped with alcohol and a local anesthetic block 50-50 mixture of 1% Xylocaine with epinephrine and half percent Marcaine with epinephrine 10 mL was injected subcutaneously and then going medially and laterally to the third metatarsal for a digital block. Prior to this time out procedure was performed. The foot was then prepped and draped in usual sterile fashion, began the operation by making a fishmouth incision starting at the web space on either side of the third toe cutting down to the bone and severing the tendons. Just on top of the periosteum and we then went down to the MTP joint and cut the capsule and removed the toe. Minimal bleeding was encountered. Wound was irrigated out normal saline solution and the fishmouth incision closed with 3-0 interrupted nylon suture. A dressing of Xerofoam 4 x 4 dressing sponges 2 inch gauze and an Ace wrap was then applied followed by a postop shoe. The patient  was taken to the recovery without difficulty.

## 2014-01-03 NOTE — Transfer of Care (Signed)
Immediate Anesthesia Transfer of Care Note  Patient: Tracy Sharp  Procedure(s) Performed: Procedure(s): LEFT THIRD TOE AMPUTATION (Left)  Patient Location: PACU  Anesthesia Type:MAC and MAC combined with regional for post-op pain  Level of Consciousness: awake, alert  and oriented  Airway & Oxygen Therapy: Patient Spontanous Breathing and Patient connected to face mask oxygen  Post-op Assessment: Report given to PACU RN and Post -op Vital signs reviewed and stable  Post vital signs: Reviewed and stable  Complications: No apparent anesthesia complications

## 2014-01-03 NOTE — Interval H&P Note (Signed)
History and Physical Interval Note:  01/03/2014 11:01 AM  Tracy Sharp  has presented today for surgery, with the diagnosis of left foot third toe arthritis  The various methods of treatment have been discussed with the patient and family. After consideration of risks, benefits and other options for treatment, the patient has consented to  Procedure(s): LEFT THIRD TOE AMPUTATION (Left) as a surgical intervention .  The patient's history has been reviewed, patient examined, no change in status, stable for surgery.  I have reviewed the patient's chart and labs.  Questions were answered to the patient's satisfaction.     Kerin Salen

## 2014-01-06 ENCOUNTER — Encounter (HOSPITAL_COMMUNITY)
Admission: RE | Admit: 2014-01-06 | Discharge: 2014-01-06 | Disposition: A | Discharge status: Home / Self Care | Payer: Medicare Other | Source: Ambulatory Visit | Attending: Orthopedic Surgery | Admitting: Orthopedic Surgery

## 2014-01-06 ENCOUNTER — Encounter (HOSPITAL_BASED_OUTPATIENT_CLINIC_OR_DEPARTMENT_OTHER): Payer: Self-pay | Admitting: Orthopedic Surgery

## 2014-01-06 ENCOUNTER — Encounter (HOSPITAL_COMMUNITY): Payer: Medicare Other

## 2014-01-06 DIAGNOSIS — M25561 Pain in right knee: Secondary | ICD-10-CM | POA: Diagnosis not present

## 2014-01-06 DIAGNOSIS — Z96651 Presence of right artificial knee joint: Secondary | ICD-10-CM | POA: Diagnosis not present

## 2014-01-06 MED ORDER — TECHNETIUM TC 99M SESTAMIBI GENERIC - CARDIOLITE: 30.0000 | Freq: Once | INTRAVENOUS | Status: AC | PRN

## 2014-01-06 MED ORDER — TECHNETIUM TC 99M SESTAMIBI GENERIC - CARDIOLITE: 10.0000 | Freq: Once | INTRAVENOUS | Status: AC | PRN

## 2014-01-06 MED ORDER — TECHNETIUM TC 99M MEDRONATE IV KIT
25.0000 | PACK | Freq: Once | INTRAVENOUS | Status: AC | PRN
  Administered 2014-01-06: 25 via INTRAVENOUS

## 2014-02-04 DIAGNOSIS — Z96651 Presence of right artificial knee joint: Secondary | ICD-10-CM | POA: Diagnosis not present

## 2014-02-04 DIAGNOSIS — Z9889 Other specified postprocedural states: Secondary | ICD-10-CM | POA: Diagnosis not present

## 2014-02-04 DIAGNOSIS — M25561 Pain in right knee: Secondary | ICD-10-CM | POA: Diagnosis not present

## 2014-02-05 DIAGNOSIS — M2550 Pain in unspecified joint: Secondary | ICD-10-CM | POA: Diagnosis not present

## 2014-02-05 DIAGNOSIS — Z791 Long term (current) use of non-steroidal anti-inflammatories (NSAID): Secondary | ICD-10-CM | POA: Diagnosis not present

## 2014-02-05 DIAGNOSIS — M1990 Unspecified osteoarthritis, unspecified site: Secondary | ICD-10-CM | POA: Diagnosis not present

## 2014-02-28 DIAGNOSIS — R29818 Other symptoms and signs involving the nervous system: Secondary | ICD-10-CM | POA: Diagnosis not present

## 2014-02-28 DIAGNOSIS — F324 Major depressive disorder, single episode, in partial remission: Secondary | ICD-10-CM | POA: Diagnosis not present

## 2014-02-28 DIAGNOSIS — I868 Varicose veins of other specified sites: Secondary | ICD-10-CM | POA: Diagnosis not present

## 2014-02-28 DIAGNOSIS — F419 Anxiety disorder, unspecified: Secondary | ICD-10-CM | POA: Diagnosis not present

## 2014-03-05 DIAGNOSIS — M0609 Rheumatoid arthritis without rheumatoid factor, multiple sites: Secondary | ICD-10-CM | POA: Diagnosis not present

## 2014-03-05 DIAGNOSIS — W19XXXD Unspecified fall, subsequent encounter: Secondary | ICD-10-CM | POA: Diagnosis not present

## 2014-03-05 DIAGNOSIS — M2550 Pain in unspecified joint: Secondary | ICD-10-CM | POA: Diagnosis not present

## 2014-03-05 DIAGNOSIS — M1990 Unspecified osteoarthritis, unspecified site: Secondary | ICD-10-CM | POA: Diagnosis not present

## 2014-03-07 ENCOUNTER — Encounter: Payer: Self-pay | Admitting: Vascular Surgery

## 2014-03-07 ENCOUNTER — Other Ambulatory Visit: Payer: Self-pay | Admitting: *Deleted

## 2014-03-07 DIAGNOSIS — I83813 Varicose veins of bilateral lower extremities with pain: Secondary | ICD-10-CM

## 2014-03-13 DIAGNOSIS — R262 Difficulty in walking, not elsewhere classified: Secondary | ICD-10-CM | POA: Diagnosis not present

## 2014-03-13 DIAGNOSIS — Z9181 History of falling: Secondary | ICD-10-CM | POA: Diagnosis not present

## 2014-03-13 DIAGNOSIS — M1990 Unspecified osteoarthritis, unspecified site: Secondary | ICD-10-CM | POA: Diagnosis not present

## 2014-03-13 DIAGNOSIS — M0609 Rheumatoid arthritis without rheumatoid factor, multiple sites: Secondary | ICD-10-CM | POA: Diagnosis not present

## 2014-03-13 DIAGNOSIS — M6281 Muscle weakness (generalized): Secondary | ICD-10-CM | POA: Diagnosis not present

## 2014-03-13 DIAGNOSIS — R279 Unspecified lack of coordination: Secondary | ICD-10-CM | POA: Diagnosis not present

## 2014-03-18 DIAGNOSIS — R262 Difficulty in walking, not elsewhere classified: Secondary | ICD-10-CM | POA: Diagnosis not present

## 2014-03-18 DIAGNOSIS — Z9181 History of falling: Secondary | ICD-10-CM | POA: Diagnosis not present

## 2014-03-18 DIAGNOSIS — M0609 Rheumatoid arthritis without rheumatoid factor, multiple sites: Secondary | ICD-10-CM | POA: Diagnosis not present

## 2014-03-18 DIAGNOSIS — R279 Unspecified lack of coordination: Secondary | ICD-10-CM | POA: Diagnosis not present

## 2014-03-18 DIAGNOSIS — M1990 Unspecified osteoarthritis, unspecified site: Secondary | ICD-10-CM | POA: Diagnosis not present

## 2014-03-18 DIAGNOSIS — M6281 Muscle weakness (generalized): Secondary | ICD-10-CM | POA: Diagnosis not present

## 2014-03-19 DIAGNOSIS — M1990 Unspecified osteoarthritis, unspecified site: Secondary | ICD-10-CM | POA: Diagnosis not present

## 2014-03-19 DIAGNOSIS — R262 Difficulty in walking, not elsewhere classified: Secondary | ICD-10-CM | POA: Diagnosis not present

## 2014-03-19 DIAGNOSIS — R279 Unspecified lack of coordination: Secondary | ICD-10-CM | POA: Diagnosis not present

## 2014-03-19 DIAGNOSIS — M0609 Rheumatoid arthritis without rheumatoid factor, multiple sites: Secondary | ICD-10-CM | POA: Diagnosis not present

## 2014-03-19 DIAGNOSIS — Z9181 History of falling: Secondary | ICD-10-CM | POA: Diagnosis not present

## 2014-03-19 DIAGNOSIS — M6281 Muscle weakness (generalized): Secondary | ICD-10-CM | POA: Diagnosis not present

## 2014-03-21 DIAGNOSIS — M0609 Rheumatoid arthritis without rheumatoid factor, multiple sites: Secondary | ICD-10-CM | POA: Diagnosis not present

## 2014-03-21 DIAGNOSIS — R279 Unspecified lack of coordination: Secondary | ICD-10-CM | POA: Diagnosis not present

## 2014-03-21 DIAGNOSIS — Z9181 History of falling: Secondary | ICD-10-CM | POA: Diagnosis not present

## 2014-03-21 DIAGNOSIS — R262 Difficulty in walking, not elsewhere classified: Secondary | ICD-10-CM | POA: Diagnosis not present

## 2014-03-21 DIAGNOSIS — M6281 Muscle weakness (generalized): Secondary | ICD-10-CM | POA: Diagnosis not present

## 2014-03-21 DIAGNOSIS — M1990 Unspecified osteoarthritis, unspecified site: Secondary | ICD-10-CM | POA: Diagnosis not present

## 2014-03-25 DIAGNOSIS — R279 Unspecified lack of coordination: Secondary | ICD-10-CM | POA: Diagnosis not present

## 2014-03-25 DIAGNOSIS — Z9181 History of falling: Secondary | ICD-10-CM | POA: Diagnosis not present

## 2014-03-25 DIAGNOSIS — R262 Difficulty in walking, not elsewhere classified: Secondary | ICD-10-CM | POA: Diagnosis not present

## 2014-03-25 DIAGNOSIS — M6281 Muscle weakness (generalized): Secondary | ICD-10-CM | POA: Diagnosis not present

## 2014-03-25 DIAGNOSIS — M1990 Unspecified osteoarthritis, unspecified site: Secondary | ICD-10-CM | POA: Diagnosis not present

## 2014-03-25 DIAGNOSIS — M0609 Rheumatoid arthritis without rheumatoid factor, multiple sites: Secondary | ICD-10-CM | POA: Diagnosis not present

## 2014-03-26 DIAGNOSIS — M1990 Unspecified osteoarthritis, unspecified site: Secondary | ICD-10-CM | POA: Diagnosis not present

## 2014-03-26 DIAGNOSIS — Z9181 History of falling: Secondary | ICD-10-CM | POA: Diagnosis not present

## 2014-03-26 DIAGNOSIS — R279 Unspecified lack of coordination: Secondary | ICD-10-CM | POA: Diagnosis not present

## 2014-03-26 DIAGNOSIS — R262 Difficulty in walking, not elsewhere classified: Secondary | ICD-10-CM | POA: Diagnosis not present

## 2014-03-26 DIAGNOSIS — M0609 Rheumatoid arthritis without rheumatoid factor, multiple sites: Secondary | ICD-10-CM | POA: Diagnosis not present

## 2014-03-26 DIAGNOSIS — M6281 Muscle weakness (generalized): Secondary | ICD-10-CM | POA: Diagnosis not present

## 2014-03-27 DIAGNOSIS — R262 Difficulty in walking, not elsewhere classified: Secondary | ICD-10-CM | POA: Diagnosis not present

## 2014-03-27 DIAGNOSIS — M1990 Unspecified osteoarthritis, unspecified site: Secondary | ICD-10-CM | POA: Diagnosis not present

## 2014-03-27 DIAGNOSIS — M0609 Rheumatoid arthritis without rheumatoid factor, multiple sites: Secondary | ICD-10-CM | POA: Diagnosis not present

## 2014-03-27 DIAGNOSIS — M6281 Muscle weakness (generalized): Secondary | ICD-10-CM | POA: Diagnosis not present

## 2014-03-27 DIAGNOSIS — R279 Unspecified lack of coordination: Secondary | ICD-10-CM | POA: Diagnosis not present

## 2014-03-27 DIAGNOSIS — Z9181 History of falling: Secondary | ICD-10-CM | POA: Diagnosis not present

## 2014-03-28 DIAGNOSIS — M0609 Rheumatoid arthritis without rheumatoid factor, multiple sites: Secondary | ICD-10-CM | POA: Diagnosis not present

## 2014-03-28 DIAGNOSIS — M1990 Unspecified osteoarthritis, unspecified site: Secondary | ICD-10-CM | POA: Diagnosis not present

## 2014-03-28 DIAGNOSIS — R262 Difficulty in walking, not elsewhere classified: Secondary | ICD-10-CM | POA: Diagnosis not present

## 2014-03-28 DIAGNOSIS — M6281 Muscle weakness (generalized): Secondary | ICD-10-CM | POA: Diagnosis not present

## 2014-03-28 DIAGNOSIS — R279 Unspecified lack of coordination: Secondary | ICD-10-CM | POA: Diagnosis not present

## 2014-03-28 DIAGNOSIS — Z9181 History of falling: Secondary | ICD-10-CM | POA: Diagnosis not present

## 2014-04-01 DIAGNOSIS — R262 Difficulty in walking, not elsewhere classified: Secondary | ICD-10-CM | POA: Diagnosis not present

## 2014-04-01 DIAGNOSIS — M1990 Unspecified osteoarthritis, unspecified site: Secondary | ICD-10-CM | POA: Diagnosis not present

## 2014-04-01 DIAGNOSIS — M6281 Muscle weakness (generalized): Secondary | ICD-10-CM | POA: Diagnosis not present

## 2014-04-01 DIAGNOSIS — Z9181 History of falling: Secondary | ICD-10-CM | POA: Diagnosis not present

## 2014-04-01 DIAGNOSIS — R279 Unspecified lack of coordination: Secondary | ICD-10-CM | POA: Diagnosis not present

## 2014-04-01 DIAGNOSIS — M0609 Rheumatoid arthritis without rheumatoid factor, multiple sites: Secondary | ICD-10-CM | POA: Diagnosis not present

## 2014-04-03 DIAGNOSIS — M6281 Muscle weakness (generalized): Secondary | ICD-10-CM | POA: Diagnosis not present

## 2014-04-03 DIAGNOSIS — M1990 Unspecified osteoarthritis, unspecified site: Secondary | ICD-10-CM | POA: Diagnosis not present

## 2014-04-03 DIAGNOSIS — M0609 Rheumatoid arthritis without rheumatoid factor, multiple sites: Secondary | ICD-10-CM | POA: Diagnosis not present

## 2014-04-03 DIAGNOSIS — R262 Difficulty in walking, not elsewhere classified: Secondary | ICD-10-CM | POA: Diagnosis not present

## 2014-04-03 DIAGNOSIS — Z9181 History of falling: Secondary | ICD-10-CM | POA: Diagnosis not present

## 2014-04-03 DIAGNOSIS — R279 Unspecified lack of coordination: Secondary | ICD-10-CM | POA: Diagnosis not present

## 2014-04-04 DIAGNOSIS — R262 Difficulty in walking, not elsewhere classified: Secondary | ICD-10-CM | POA: Diagnosis not present

## 2014-04-04 DIAGNOSIS — M6281 Muscle weakness (generalized): Secondary | ICD-10-CM | POA: Diagnosis not present

## 2014-04-04 DIAGNOSIS — M1990 Unspecified osteoarthritis, unspecified site: Secondary | ICD-10-CM | POA: Diagnosis not present

## 2014-04-04 DIAGNOSIS — R279 Unspecified lack of coordination: Secondary | ICD-10-CM | POA: Diagnosis not present

## 2014-04-04 DIAGNOSIS — Z9181 History of falling: Secondary | ICD-10-CM | POA: Diagnosis not present

## 2014-04-04 DIAGNOSIS — M0609 Rheumatoid arthritis without rheumatoid factor, multiple sites: Secondary | ICD-10-CM | POA: Diagnosis not present

## 2014-04-07 DIAGNOSIS — Z9181 History of falling: Secondary | ICD-10-CM | POA: Diagnosis not present

## 2014-04-07 DIAGNOSIS — R279 Unspecified lack of coordination: Secondary | ICD-10-CM | POA: Diagnosis not present

## 2014-04-07 DIAGNOSIS — M1990 Unspecified osteoarthritis, unspecified site: Secondary | ICD-10-CM | POA: Diagnosis not present

## 2014-04-07 DIAGNOSIS — R262 Difficulty in walking, not elsewhere classified: Secondary | ICD-10-CM | POA: Diagnosis not present

## 2014-04-07 DIAGNOSIS — M6281 Muscle weakness (generalized): Secondary | ICD-10-CM | POA: Diagnosis not present

## 2014-04-07 DIAGNOSIS — M0609 Rheumatoid arthritis without rheumatoid factor, multiple sites: Secondary | ICD-10-CM | POA: Diagnosis not present

## 2014-04-10 DIAGNOSIS — R279 Unspecified lack of coordination: Secondary | ICD-10-CM | POA: Diagnosis not present

## 2014-04-10 DIAGNOSIS — M0609 Rheumatoid arthritis without rheumatoid factor, multiple sites: Secondary | ICD-10-CM | POA: Diagnosis not present

## 2014-04-10 DIAGNOSIS — M6281 Muscle weakness (generalized): Secondary | ICD-10-CM | POA: Diagnosis not present

## 2014-04-10 DIAGNOSIS — M1990 Unspecified osteoarthritis, unspecified site: Secondary | ICD-10-CM | POA: Diagnosis not present

## 2014-04-10 DIAGNOSIS — Z9181 History of falling: Secondary | ICD-10-CM | POA: Diagnosis not present

## 2014-04-10 DIAGNOSIS — R262 Difficulty in walking, not elsewhere classified: Secondary | ICD-10-CM | POA: Diagnosis not present

## 2014-04-11 DIAGNOSIS — M6281 Muscle weakness (generalized): Secondary | ICD-10-CM | POA: Diagnosis not present

## 2014-04-11 DIAGNOSIS — Z9181 History of falling: Secondary | ICD-10-CM | POA: Diagnosis not present

## 2014-04-11 DIAGNOSIS — M1990 Unspecified osteoarthritis, unspecified site: Secondary | ICD-10-CM | POA: Diagnosis not present

## 2014-04-11 DIAGNOSIS — R262 Difficulty in walking, not elsewhere classified: Secondary | ICD-10-CM | POA: Diagnosis not present

## 2014-04-11 DIAGNOSIS — M0609 Rheumatoid arthritis without rheumatoid factor, multiple sites: Secondary | ICD-10-CM | POA: Diagnosis not present

## 2014-04-11 DIAGNOSIS — R279 Unspecified lack of coordination: Secondary | ICD-10-CM | POA: Diagnosis not present

## 2014-04-15 DIAGNOSIS — R262 Difficulty in walking, not elsewhere classified: Secondary | ICD-10-CM | POA: Diagnosis not present

## 2014-04-15 DIAGNOSIS — Z9181 History of falling: Secondary | ICD-10-CM | POA: Diagnosis not present

## 2014-04-15 DIAGNOSIS — R279 Unspecified lack of coordination: Secondary | ICD-10-CM | POA: Diagnosis not present

## 2014-04-15 DIAGNOSIS — M1990 Unspecified osteoarthritis, unspecified site: Secondary | ICD-10-CM | POA: Diagnosis not present

## 2014-04-15 DIAGNOSIS — M6281 Muscle weakness (generalized): Secondary | ICD-10-CM | POA: Diagnosis not present

## 2014-04-15 DIAGNOSIS — M0609 Rheumatoid arthritis without rheumatoid factor, multiple sites: Secondary | ICD-10-CM | POA: Diagnosis not present

## 2014-04-17 DIAGNOSIS — M0609 Rheumatoid arthritis without rheumatoid factor, multiple sites: Secondary | ICD-10-CM | POA: Diagnosis not present

## 2014-04-17 DIAGNOSIS — M1990 Unspecified osteoarthritis, unspecified site: Secondary | ICD-10-CM | POA: Diagnosis not present

## 2014-04-17 DIAGNOSIS — Z9181 History of falling: Secondary | ICD-10-CM | POA: Diagnosis not present

## 2014-04-17 DIAGNOSIS — M6281 Muscle weakness (generalized): Secondary | ICD-10-CM | POA: Diagnosis not present

## 2014-04-17 DIAGNOSIS — R279 Unspecified lack of coordination: Secondary | ICD-10-CM | POA: Diagnosis not present

## 2014-04-17 DIAGNOSIS — R262 Difficulty in walking, not elsewhere classified: Secondary | ICD-10-CM | POA: Diagnosis not present

## 2014-04-28 ENCOUNTER — Encounter (HOSPITAL_COMMUNITY): Payer: Self-pay | Admitting: Emergency Medicine

## 2014-04-28 ENCOUNTER — Emergency Department (HOSPITAL_COMMUNITY): Payer: Medicare Other

## 2014-04-28 ENCOUNTER — Emergency Department (HOSPITAL_COMMUNITY)
Admission: EM | Admit: 2014-04-28 | Discharge: 2014-04-29 | Disposition: A | Discharge status: Home / Self Care | Payer: Medicare Other | Attending: Emergency Medicine | Admitting: Emergency Medicine

## 2014-04-28 DIAGNOSIS — M25552 Pain in left hip: Secondary | ICD-10-CM | POA: Diagnosis not present

## 2014-04-28 DIAGNOSIS — Z791 Long term (current) use of non-steroidal anti-inflammatories (NSAID): Secondary | ICD-10-CM | POA: Diagnosis not present

## 2014-04-28 DIAGNOSIS — Z8719 Personal history of other diseases of the digestive system: Secondary | ICD-10-CM | POA: Diagnosis not present

## 2014-04-28 DIAGNOSIS — S8992XA Unspecified injury of left lower leg, initial encounter: Secondary | ICD-10-CM | POA: Diagnosis not present

## 2014-04-28 DIAGNOSIS — Z8739 Personal history of other diseases of the musculoskeletal system and connective tissue: Secondary | ICD-10-CM | POA: Diagnosis not present

## 2014-04-28 DIAGNOSIS — W1830XA Fall on same level, unspecified, initial encounter: Secondary | ICD-10-CM | POA: Insufficient documentation

## 2014-04-28 DIAGNOSIS — Y9289 Other specified places as the place of occurrence of the external cause: Secondary | ICD-10-CM | POA: Insufficient documentation

## 2014-04-28 DIAGNOSIS — Z96652 Presence of left artificial knee joint: Secondary | ICD-10-CM | POA: Diagnosis not present

## 2014-04-28 DIAGNOSIS — M25569 Pain in unspecified knee: Secondary | ICD-10-CM

## 2014-04-28 DIAGNOSIS — R52 Pain, unspecified: Secondary | ICD-10-CM | POA: Diagnosis not present

## 2014-04-28 DIAGNOSIS — Z8659 Personal history of other mental and behavioral disorders: Secondary | ICD-10-CM | POA: Insufficient documentation

## 2014-04-28 DIAGNOSIS — Z7982 Long term (current) use of aspirin: Secondary | ICD-10-CM | POA: Diagnosis not present

## 2014-04-28 DIAGNOSIS — S32010A Wedge compression fracture of first lumbar vertebra, initial encounter for closed fracture: Secondary | ICD-10-CM

## 2014-04-28 DIAGNOSIS — Y9389 Activity, other specified: Secondary | ICD-10-CM | POA: Insufficient documentation

## 2014-04-28 DIAGNOSIS — Z79899 Other long term (current) drug therapy: Secondary | ICD-10-CM | POA: Diagnosis not present

## 2014-04-28 DIAGNOSIS — S79912A Unspecified injury of left hip, initial encounter: Secondary | ICD-10-CM | POA: Insufficient documentation

## 2014-04-28 DIAGNOSIS — I2510 Atherosclerotic heart disease of native coronary artery without angina pectoris: Secondary | ICD-10-CM | POA: Insufficient documentation

## 2014-04-28 DIAGNOSIS — S3992XA Unspecified injury of lower back, initial encounter: Secondary | ICD-10-CM | POA: Diagnosis present

## 2014-04-28 DIAGNOSIS — I10 Essential (primary) hypertension: Secondary | ICD-10-CM | POA: Insufficient documentation

## 2014-04-28 DIAGNOSIS — M25562 Pain in left knee: Secondary | ICD-10-CM | POA: Diagnosis not present

## 2014-04-28 DIAGNOSIS — W19XXXA Unspecified fall, initial encounter: Secondary | ICD-10-CM

## 2014-04-28 DIAGNOSIS — Z9889 Other specified postprocedural states: Secondary | ICD-10-CM | POA: Insufficient documentation

## 2014-04-28 DIAGNOSIS — M546 Pain in thoracic spine: Secondary | ICD-10-CM | POA: Diagnosis not present

## 2014-04-28 DIAGNOSIS — Z8639 Personal history of other endocrine, nutritional and metabolic disease: Secondary | ICD-10-CM | POA: Diagnosis not present

## 2014-04-28 DIAGNOSIS — S299XXA Unspecified injury of thorax, initial encounter: Secondary | ICD-10-CM | POA: Diagnosis not present

## 2014-04-28 DIAGNOSIS — Y998 Other external cause status: Secondary | ICD-10-CM | POA: Diagnosis not present

## 2014-04-28 LAB — URINALYSIS, ROUTINE W REFLEX MICROSCOPIC
BILIRUBIN URINE: NEGATIVE
Glucose, UA: NEGATIVE mg/dL
KETONES UR: NEGATIVE mg/dL
Leukocytes, UA: NEGATIVE
NITRITE: NEGATIVE
PROTEIN: NEGATIVE mg/dL
Specific Gravity, Urine: 1.022 (ref 1.005–1.030)
UROBILINOGEN UA: 1 mg/dL (ref 0.0–1.0)
pH: 7 (ref 5.0–8.0)

## 2014-04-28 LAB — CBC WITH DIFFERENTIAL/PLATELET
BASOS ABS: 0 10*3/uL (ref 0.0–0.1)
Basophils Relative: 1 % (ref 0–1)
EOS ABS: 0.1 10*3/uL (ref 0.0–0.7)
EOS PCT: 2 % (ref 0–5)
HCT: 40.1 % (ref 36.0–46.0)
Hemoglobin: 12.6 g/dL (ref 12.0–15.0)
Lymphocytes Relative: 20 % (ref 12–46)
Lymphs Abs: 1.2 10*3/uL (ref 0.7–4.0)
MCH: 30 pg (ref 26.0–34.0)
MCHC: 31.4 g/dL (ref 30.0–36.0)
MCV: 95.5 fL (ref 78.0–100.0)
MONO ABS: 0.4 10*3/uL (ref 0.1–1.0)
Monocytes Relative: 7 % (ref 3–12)
Neutro Abs: 4.1 10*3/uL (ref 1.7–7.7)
Neutrophils Relative %: 70 % (ref 43–77)
PLATELETS: 197 10*3/uL (ref 150–400)
RBC: 4.2 MIL/uL (ref 3.87–5.11)
RDW: 13.4 % (ref 11.5–15.5)
WBC: 5.9 10*3/uL (ref 4.0–10.5)

## 2014-04-28 LAB — URINE MICROSCOPIC-ADD ON

## 2014-04-28 LAB — BASIC METABOLIC PANEL
Anion gap: 6 (ref 5–15)
BUN: 27 mg/dL — ABNORMAL HIGH (ref 6–23)
CO2: 29 mmol/L (ref 19–32)
Calcium: 9.2 mg/dL (ref 8.4–10.5)
Chloride: 104 mmol/L (ref 96–112)
Creatinine, Ser: 0.47 mg/dL — ABNORMAL LOW (ref 0.50–1.10)
GFR calc Af Amer: >90 mL/min (ref >90)
GFR calc non Af Amer: 86 mL/min — ABNORMAL LOW (ref >90)
Glucose, Bld: 120 mg/dL — ABNORMAL HIGH (ref 70–99)
Potassium: 4.3 mmol/L (ref 3.5–5.1)
SODIUM: 139 mmol/L (ref 135–145)

## 2014-04-28 MED ORDER — TRAMADOL HCL 50 MG PO TABS
50.0000 mg | ORAL_TABLET | Freq: Once | ORAL | Status: AC
  Administered 2014-04-28: 50 mg via ORAL
  Filled 2014-04-28: qty 1

## 2014-04-28 MED ORDER — FENTANYL CITRATE 0.05 MG/ML IJ SOLN
25.0000 ug | Freq: Once | INTRAMUSCULAR | Status: AC
  Administered 2014-04-28: 25 ug via INTRAVENOUS
  Filled 2014-04-28: qty 2

## 2014-04-28 MED ORDER — TRAMADOL HCL 50 MG PO TABS: 50.0000 mg | ORAL_TABLET | Freq: Four times a day (QID) | ORAL | Status: DC | PRN

## 2014-04-28 NOTE — ED Notes (Addendum)
Pt fell on to her buttocks today. Now c/o L knee and lowerback pain. L knee is swollen normally as it was replaced. Denies LOC or head trauma. 249mcg fentanyl given IV en route. Alert and oriented.
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
## 2014-04-28 NOTE — Discharge Instructions (Signed)
Vertebral Fracture  You have a fracture of one or more vertebra. These are the bony parts that form the spine. Minor vertebral fractures happen when people fall. Osteoporosis is associated with many of these fractures. Hospital care may not be necessary for minor compression fractures that are stable. However, multiple fractures of the spine or unstable injuries can cause severe pain and even damage the spinal cord. A spinal cord injury may cause paralysis, numbness, or loss of normal bowel and bladder control.   Normally there is pain and stiffness in the back for 3 to 6 weeks after a vertebral fracture. Bed rest for several days, pain medicine, and a slow return to activity is often the only treatment that is needed depending on the location of the fracture. Neck and back braces may be helpful in reducing pain and increasing mobility. When your pain allows, you should begin walking or swimming to help maintain your endurance. Exercises to improve motion and to strengthen the back may also be useful after the initial pain improves. Treatment for osteoporosis may be essential for full recovery. This will help reduce your risk of vertebral fractures with a future fall.  During the first few days after a spine fracture you may feel nauseated or vomit. If this is severe, hospital care with IV fluids will be needed.   Arrange for follow-up care as recommended to assure proper long-term care and prevention of further spine injury.   SEEK IMMEDIATE MEDICAL CARE IF:   You have increasing pain, vomiting, or are unable to move around at all.   You develop numbness, tingling, weakness, or paralysis of any part of your body.   You develop a loss of normal bowel or bladder control.   You have difficulty breathing, cough, fever, chest or abdominal pain.  MAKE SURE YOU:    Understand these instructions.   Will watch your condition.   Will get help right away if you are not doing well or get worse.  Document Released:  02/25/2004 Document Revised: 04/11/2011 Document Reviewed: 09/09/2008  Exit[employer] Patient Information 2015 [employer]. This information is not intended to replace advice given to you by your health care provider. Make sure you discuss any questions you have with your health care provider.

## 2014-04-28 NOTE — ED Notes (Signed)
Communications notified of patient's need for transport home.

## 2014-04-28 NOTE — ED Notes (Signed)
Ambulated with walker and one assist without difficulty.

## 2014-04-28 NOTE — ED Notes (Signed)
Communi

## 2014-04-28 NOTE — Progress Notes (Signed)
04/28/2014 A. Licet Dunphy RNCM 2252pm Patient to be discharged. EDCM followed up with patient's duaghter in law who reports they would like [facility] for home health services again. Patient noted to be ambulating with walker with minimal assistance up the hallway in the ED to the bathroom.  [facility] consult placed.  [facility] faxed home health referral to [facility] at 2237pm with confirmation of receipt at 2239pm.  No further EDCM needs at this time.

## 2014-04-28 NOTE — Progress Notes (Signed)
EDCM and EDSW spoke to patient and her daughter and daughter in law at bedside.  Patient lives at home alone.  Patient's daughter in law reports she checks in with the patient daily.  Patient does not have home health services currently.  Patient has had home health services with [facility] in the past.  Patient has a walker, wheelchair, shower chair, elevated toilet seat, hospital bed and grab rails in the bathroom at home.  Patient's daughter in law reports the patient is able to perform a "bird bath" at home.  EDCM provided patient's daughter in law a list of home health agencies in [facility], list of private duty nursing agencies, printed information regarding the senior resources of [place] and information regarding the [facility].  EDCM informed patient's family that private duty nursing services would be an out of pocket expense for the patient.   Patient's daughter in law confirms patient's pcp is Dr. Harlan Stains.  EDCM explained home health services the patient may receive a visiting RN, PT, OT aide and social worker for placement if needed,  and that home health agency of her choice has 24-48 hours to contact the patient.  [facility] reviewed Medicare guidelines regarding inpatient hospital stay.  Patient and patient's family thankful for resources.  Discussed patient with EDP.  Awaiting disposition.

## 2014-04-28 NOTE — Progress Notes (Signed)
CSW met with pt at bedside along with Nurse CM. Daughter and daughter in law were present. Patient presents to [facility] due to falling today. Per note, not pt complains of L knee and lower back pain. Daughter informed CSW that the pt has had 2 L knee replacements in the past. Also, she states that the pt has had 1 R knee placement and surgery on her back in the 1960's.   Daughter in law informed CSW that the pt comes from home and lives there alone in [place]. She states that she, her brother and sister in law are the pt's support system. Daughter states that she lives [place]. She states that the daughter in law and son live 5 minutes away. CSW confirmed with daughter in law that she lives 5 minutes away. Daughter in law states that she visits the pt daily and stays at the home anywhere ranging from about 15- minutes to 5 hours. However, daughter in law states that she will be able to stay longer with the pt throughout the day until her home health services become active. Family states that she does not currently have home health in place. EDCM and CSW informed family about medicare guidelines regarding inpatient hospital stay and the requirements in order to be placed to a facility from [facility].  Family informed CSW that the pt cannot bathe herself and has incontinence issues with urine and bowel movements. Also, family states that the pt's husband died a little over a year ago and has some emotional problems. Family states that the pt can appear to be paranoid at times. Daughter in law also states that the pt has balance issues.  Family expressed to [place] their concerns about getting medicaid for the pt. CSW gave the family information for DSS Adult Services department and an ALF list. Family and pt state that they do not have any questions at this time.   Willette Brace [phone] ED CSW 04/28/2014 9:40 PM

## 2014-04-29 ENCOUNTER — Encounter (HOSPITAL_COMMUNITY): Payer: Medicare Other

## 2014-04-29 ENCOUNTER — Encounter: Payer: Medicare Other | Admitting: Vascular Surgery

## 2014-04-29 DIAGNOSIS — I25110 Atherosclerotic heart disease of native coronary artery with unstable angina pectoris: Secondary | ICD-10-CM | POA: Diagnosis not present

## 2014-04-29 DIAGNOSIS — R259 Unspecified abnormal involuntary movements: Secondary | ICD-10-CM | POA: Diagnosis not present

## 2014-04-29 LAB — URINE CULTURE
CULTURE: NO GROWTH
Colony Count: NO GROWTH

## 2014-04-29 NOTE — ED Notes (Signed)
Continues to await [facility] for transport.  Communications called for update.

## 2014-04-29 NOTE — ED Notes (Signed)
[facility] here to transport patient 

## 2014-04-30 DIAGNOSIS — S32010A Wedge compression fracture of first lumbar vertebra, initial encounter for closed fracture: Secondary | ICD-10-CM | POA: Diagnosis not present

## 2014-05-01 ENCOUNTER — Other Ambulatory Visit: Payer: Self-pay | Admitting: *Deleted

## 2014-05-01 DIAGNOSIS — I2510 Atherosclerotic heart disease of native coronary artery without angina pectoris: Secondary | ICD-10-CM | POA: Diagnosis not present

## 2014-05-01 DIAGNOSIS — I10 Essential (primary) hypertension: Secondary | ICD-10-CM | POA: Diagnosis not present

## 2014-05-01 DIAGNOSIS — S32010D Wedge compression fracture of first lumbar vertebra, subsequent encounter for fracture with routine healing: Secondary | ICD-10-CM | POA: Diagnosis not present

## 2014-05-01 DIAGNOSIS — M159 Polyosteoarthritis, unspecified: Secondary | ICD-10-CM | POA: Diagnosis not present

## 2014-05-01 DIAGNOSIS — M810 Age-related osteoporosis without current pathological fracture: Secondary | ICD-10-CM | POA: Diagnosis not present

## 2014-05-01 NOTE — Patient Outreach (Addendum)
Follow up phone call related to referral from Tracy Sharp ED nurse Tracy Sharp- recent ED visit- fell at home.  Spoke with pt, hear of hearing, states to get daughter to talk to RN CM.  Spoke with Tracy Sharp, pt's daughter in law, HIPPA verified on pt.   Discussed with daughter in law  referral for [facility] services as a result of recent ED visit, talked about RN CM services that are available to pt.   Tracy Sharp states someone from [facility] came today to assess her but services will only be short term (aide for 60 days or less).   Tracy Sharp also states plan to send out a SW.  Tracy Sharp states pt is doing good since ED visit, taking pain med which helps some.  Tracy Sharp states pt needs something more long term as she  has to work as well as her brother, have to come up with a plan.   Tracy Sharp states interested in pt receiving [facility] services, home visit scheduled for 4/6.  Tracy Sharp states pt f/u with MD at Dr. Orest Dikes office yesterday and is waiting for neurosurgeon to call, will call RN CM back to reschedule visit with pt if needed.      Addendum:   Correction- home visit scheduled for 4/5,not 4/6.

## 2014-05-05 ENCOUNTER — Telehealth: Payer: Self-pay | Admitting: *Deleted

## 2014-05-05 ENCOUNTER — Other Ambulatory Visit: Payer: Self-pay | Admitting: *Deleted

## 2014-05-05 NOTE — Patient Outreach (Signed)
Received a call from [name] (pt's daughter), f/u on call RN CM made  to pt earlier.  [name] requested pt not be contacted at home, not suppose to have this number but need to  f/u with her (daughter).   RN CM discussed with daughter relayed to pt on earlier call that coworker Claiborne Billings RN CM will be doing a home visit tomorrow at 10am.   Daughter states she has an appointment to have lab work done but will try to reschedule so can be present during pt's home visit.     Addendum:  Received a call back from [name] (pt's daughter) who states will be able to be present during home visit.

## 2014-05-06 ENCOUNTER — Other Ambulatory Visit: Payer: Self-pay

## 2014-05-06 ENCOUNTER — Other Ambulatory Visit: Payer: Self-pay | Admitting: Family Medicine

## 2014-05-06 DIAGNOSIS — M810 Age-related osteoporosis without current pathological fracture: Secondary | ICD-10-CM | POA: Diagnosis not present

## 2014-05-06 DIAGNOSIS — IMO0002 Reserved for concepts with insufficient information to code with codable children: Secondary | ICD-10-CM

## 2014-05-06 DIAGNOSIS — I10 Essential (primary) hypertension: Secondary | ICD-10-CM | POA: Diagnosis not present

## 2014-05-06 DIAGNOSIS — M159 Polyosteoarthritis, unspecified: Secondary | ICD-10-CM | POA: Diagnosis not present

## 2014-05-06 DIAGNOSIS — S32010D Wedge compression fracture of first lumbar vertebra, subsequent encounter for fracture with routine healing: Secondary | ICD-10-CM | POA: Diagnosis not present

## 2014-05-06 DIAGNOSIS — I2510 Atherosclerotic heart disease of native coronary artery without angina pectoris: Secondary | ICD-10-CM | POA: Diagnosis not present

## 2014-05-06 NOTE — Patient Instructions (Addendum)
1) Attend the appointment for diagnostic imaging at [facility] once scheduled. Imaging center is to call [name] (Caregiver/Daughter-in-law) with scheduled appointment.  2) Practice home safety- Use walker, wear non-skid footwear, sit and rest and call for assistance when feeling weak or dizzy, use bathroom guardrails, take medications as ordered.Caregiver will continue to set up medication pill box.  3) Participate in Home Physical Therapy. Initial home visit scheduled for Wednesday 05/07/14.  4) [facility] will contact caregiver to schedule a home or telephonic consult.  5) [facility] will contact caregiver to schedule a f/u home visit in May 2016.      Your community case manger is Kathie Rhodes # [phone].  6) Caregiver to schedule appointment with Dr. Dema Severin for evaluation of left 2nd toe if toe becomes more swollen, has increase redness or becomes more painful.   Back, Compression Fracture A compression fracture happens when a force is put upon the length of your spine. Slipping and falling on your bottom are examples of such a force. When this happens, sometimes the force is great enough to compress the building blocks (vertebral bodies) of your spine. Although this causes a lot of pain, this can usually be treated at home, unless your caregiver feels hospitalization is needed for pain control. Your backbone (spinal column) is made up of 24 main vertebral bodies in addition to the sacrum and coccyx (see illustration). These are held together by tough fibrous tissues (ligaments) and by support of your muscles. Nerve roots pass through the openings between the vertebrae. A sudden wrenching move, injury, or a fall may cause a compression fracture of one of the vertebral bodies. This may result in back pain or spread of pain into the belly (abdomen), the buttocks, and down the leg into the foot. Pain may also be created by muscle spasm alone. Large studies have been undertaken  to determine the best possible course of action to help your back following injury and also to prevent future problems. The recommendations are as follows. FOLLOWING A COMPRESSION FRACTURE: Do the following only if advised by your caregiver.   If a back brace has been suggested or provided, wear it as directed.  Do not stop wearing the back brace unless instructed by your caregiver.  When allowed to return to regular activities, avoid a sedentary lifestyle. Actively exercise. Sporadic weekend binges of tennis, racquetball, or waterskiing may actually aggravate or create problems, especially if you are not in condition for that activity.  Avoid sports requiring sudden body movements until you are in condition for them. Swimming and walking are safer activities.  Maintain good posture.  Avoid obesity.  If not already done, you should have a DEXA scan. Based on the results, be treated for osteoporosis. FOLLOWING ACUTE (SUDDEN) INJURY:  Only take over-the-counter or prescription medicines for pain, discomfort, or fever as directed by your caregiver.  Use bed rest for only the most extreme acute episode. Prolonged bed rest may aggravate your condition. Ice used for acute conditions is effective. Use a large plastic bag filled with ice. Wrap it in a towel. This also provides excellent pain relief. This may be continuous. Or use it for 30 minutes every 2 hours during acute phase, then as needed. Heat for 30 minutes prior to activities is helpful.  As soon as the acute phase (the time when your back is too painful for you to do normal activities) is over, it is important to resume normal activities and work hardening  programs. Back injuries can cause potentially marked changes in lifestyle. So it is important to attack these problems aggressively.  See your caregiver for continued problems. He or she can help or refer you for appropriate exercises, physical therapy, and work hardening if  needed.  If you are given narcotic medications for your condition, for the next 24 hours do not:  Drive.  Operate machinery or power tools.  Sign legal documents.  Do not drink alcohol, or take sleeping pills or other medications that may interfere with treatment. If your caregiver has given you a follow-up appointment, it is very important to keep that appointment. Not keeping the appointment could result in a chronic or permanent injury, pain, and disability. If there is any problem keeping the appointment, you must call back to this facility for assistance.  SEEK IMMEDIATE MEDICAL CARE IF:  You develop numbness, tingling, weakness, or problems with the use of your arms or legs.  You develop severe back pain not relieved with medications.  You have changes in bowel or bladder control.  You have increasing pain in any areas of the body.  Document Released: 01/17/2005 Document Revised: 06/03/2013 Document Reviewed: 08/22/2007 [facility] Patient Information 2015 [place], [place]. This information is not intended to replace advice given to you by your health care provider. Make sure you discuss any questions you have with your health care provider.   Fall Prevention and Home Safety Falls cause injuries and can affect all age groups. It is possible to prevent falls.  HOW TO PREVENT FALLS  Wear shoes with rubber soles that do not have an opening for your toes.  Keep the inside and outside of your house well lit.  Use night lights throughout your home.  Remove clutter from floors.  Clean up floor spills.  Remove throw rugs or fasten them to the floor with carpet tape.  Do not place electrical cords across pathways.  Put grab bars by your tub, shower, and toilet. Do not use towel bars as grab bars.  Put handrails on both sides of the stairway. Fix loose handrails.  Do not climb on stools or stepladders, if possible.  Do not wax your floors.  Repair uneven or unsafe  sidewalks, walkways, or stairs.  Keep items you use a lot within reach.  Be aware of pets.  Keep emergency numbers next to the telephone.  Put smoke detectors in your home and near bedrooms. Ask your doctor what other things you can do to prevent falls.  Document Released: 11/13/2008 Document Revised: 07/19/2011 Document Reviewed: 04/19/2011 [facility] Patient Information 2015 [place], [place]. This information is not intended to replace advice given to you by your health care provider. Make sure you discuss any questions you have with your health care provider.

## 2014-05-06 NOTE — Patient Outreach (Signed)
[facility]) Care Management   05/06/2014  Tracy Sharp [birth year] [id number]  Tracy Sharp is an 81 y.o. female  Subjective: Reports she feels sad most days and misses her husband. Husband passed 2 years ago. States hospice grief counseling was helpful with loss and she may want to restart that again in the future. Presently states she is not interested in counseling for depression or grief. States, "I want to concentrate on my back pain." Daughter-in-law, caregeiver, states she requires  Outside assistance with care giving responsibilities. Interested in discussing options with [facility] SW. Reports family and patient have discussed potential future placement in an assisted living or skilled nursing facility.  Objective:   Review of Systems  HENT: Positive for hearing loss.        Wearing bilateral hearing aides. Able to hear conversation if spoken to in clear words with slight increase volume.  Cardiovascular: Negative for chest pain and leg swelling.  Gastrointestinal:       Abdomen soft and non-tender with active bowel sounds. Reports occasional bowel incontinence.  Genitourinary:       Urinary incontinence. Uses incontinency briefs.  Musculoskeletal: Positive for back pain and falls.       L 1 back pain centrally located with extension slightly right of midline. Denies any radiation. Bilateral knee pain with flexion and extension. Increased pain with standing and ambulation.  Neurological: Negative for dizziness and loss of consciousness.  Endo/Heme/Allergies: Bruises/bleeds easily.       Fragile skin. Multiple areas of ecchymosis on forearms. Skin intact. Yellowing bruise left hip and left elbow. Bruises occurred from 04/28/14 fall.  Psychiatric/Behavioral: Positive for depression and memory loss. Negative for suicidal ideas. The patient is nervous/anxious and has insomnia.        States misses husband who passed away 2 years ago after 63 1/2 years of marriage. Tearful when  talking about him. Reports antidepressant medication does help sadness, but notes feeling alone and sad everyday. Reports feels she would be better off dead. No thoughts of self-harm, has not contemplated suicide. Has no suicide plan. Notes short-term memory has decreased. Long-term memory intact. Long history of anxiety. Anxiety worsens at night. Occasional insomnia    Physical Exam  Constitutional: She is oriented to person, place, and time. She appears well-developed and well-nourished. No distress.  Cardiovascular: Normal rate and regular rhythm.   Respiratory: Breath sounds normal.  GI: Soft. Bowel sounds are normal.  Musculoskeletal: She exhibits no edema.  Neurological: She is alert and oriented to person, place, and time.  Fully participated in conversation. At times had difficulty following questions. Referred some questions to daughter-in-law.   Skin: Skin is warm and dry.  Bilateral forearm scattered ecchymosis. Healing, yellowing left hip and elbow bruise. 2nd left toe with distal callous and shallow 0.34mm ulceration. Toe mildly swollen with distal pinkness and tenderness to touch.   Psychiatric: She has a normal mood and affect. Her behavior is normal.    Current Medications:   Current Outpatient Prescriptions  Medication Sig Dispense Refill  . acetaminophen (TYLENOL) 500 MG tablet Take 1,000 mg by mouth every 6 (six) hours as needed for moderate pain (arthritis pain).     . Artificial Tear Ointment (EYE LUBRICANT OP) Apply to eye at bedtime.    Marland Kitchen aspirin 81 MG tablet Take 81 mg by mouth daily.      Marland Kitchen CALCIUM PO Take 30 mLs by mouth daily.    . cholecalciferol (VITAMIN D) 1000 UNITS tablet  Take 2,000 Units by mouth daily.     Marland Kitchen docusate sodium (COLACE) 100 MG capsule Take 100 mg by mouth 2 (two) times daily.    . DULoxetine (CYMBALTA) 30 MG capsule Take 30 mg by mouth daily.    . Hypromellose (ARTIFICIAL TEARS OP) Place 2 drops into both eyes 2 (two) times daily.    .  lansoprazole (PREVACID) 30 MG capsule Take 30 mg by mouth daily at 12 noon.    . Melatonin 5 MG TABS Take 7.5 mg by mouth at bedtime.    . meloxicam (MOBIC) 15 MG tablet Take 15 mg by mouth Daily.     . mirabegron ER (MYRBETRIQ) 50 MG TB24 tablet Take 50 mg by mouth at bedtime.     . traMADol (ULTRAM) 50 MG tablet Take 1 tablet (50 mg total) by mouth every 6 (six) hours as needed for moderate pain or severe pain. 30 tablet 0   No current facility-administered medications for this visit.    Functional Status:   In your present state of health, do you have any difficulty performing the following activities: 05/06/2014 01/03/2014  Is the patient deaf or have difficulty hearing? Y Y  Hearing N N  Vision - N  Difficulty concentrating or making decisions Y Y  Walking or climbing stairs? Y N  Doing errands, shopping? Y -  Conservation officer, nature and eating ? Y -  Using the Toilet? Y -  In the past six months, have you accidently leaked urine? Y -  Do you have problems with loss of bowel control? Y -  Managing your Medications? Y -  Managing your Finances? Y -  Housekeeping or managing your Housekeeping? Y -    Fall/Depression Screening:    PHQ 2/9 Scores 05/06/2014  PHQ - 2 Score 6  PHQ- 9 Score 10    Assessment:  Patient and family reporting increased assistance is needed at home. Daughter-in law, [name], present and engaged during visit. She has assumed the primary caregiver role. Son and daughter both work full-time and also support the patient. Patient and [name] state without assistance patient could not remain at home alone. Presently [name] assisting patient 6 hours/day. Patient requires assist with ADLs and IADLs. Patient has Life Alert necklace and did use it appropriately 04/28/14 when she fell. Patient staying at home alone overnight. Transfers and ambulates independently with walker. Gait slow and unsteady with navigating corners or backing up. Has a wheelchair she refuses to use. Ramp is  present at back door entrance.  Family requesting assistance with setting up a plan for increased help in the home or placement in Assisted Living/Skilled Nursing. Daughter-in law requires respite from daily care giving responsibilities. Patients daughter does assist every other weekend. Prior to fall a home health care aide assisted patient with bathing 2x/week which was of great benefit to the family. Home health care was ordered again and will be delivered by [facility]. Initial evaluation was performed 05/01/14 no follow-up visits scheduled. Phone call to agency to check on visit status. RN to come out today and Thursday. PT visit scheduled for tomorrow 05/07/14 ans SW visit scheduled for Friday 05/09/14. Patient referred to neurosurgeon-Dr. Joya Salm. He ordered imaging at [facility] imaging. Patient awaiting call back from imaging center to schedule diagnostic.    Plan:  1) Collaborative care plan and goal setting completed with patient and daughter-in law 2) Priority Goal is to complete a plan for additional home services and future placement if necessary. Referral made to  SW to assist with community resources and planning. 3) Phone call to [facility] to coordinate home health care visits and alert them that primary caregiver was not updated on scheduled follow-up visits. 4) Phone call to [facility] with request to call caregiver to schedule ordered imaging 5) Update Dr. Dema Severin regarding the initiation of [facility] services and the plan of care as well as on the tender, calloused toe. Caregiver will schedule appointment with MD if toe continues with tenderness or becomes worse in appearance. Caregiver reports toe has improved in appearance.  6) Transiiton patient to telephonic case management if needed after short-term community case management  [place]        Patient Outreach from 05/06/2014 in [place] Problem One  Risk for Falls r/t age, use of walker, pain aeb history of  falls   Care Plan for Problem One  Active   Interventions for Problem One Long Term Goal  1) Use walker for ambulation 2) Wear shoes or no skid socks when ambulating 3)Use arms of chair for support when transfering from sit to stand 4) use guard rails for toileting and showering 5) Do not attempt to walk if experiencing weakness or dizziness- Sit and rest and call for assistance   [facility] Long Term Goal (31-90 days)  Patient will not fall in the next 31 days   [facility] Long Term Goal Start Date  05/06/14   [facility] CM Short Term Goal #1 (0-30 days)  Patient will participate in home physical therapy for the next 30 days   [facility] CM Short Term Goal #1 Start Date  05/06/14   Care Plan Problem Two  Pain   Care Plan for Problem Two  Active   [facility] CM Short Term Goal #1 (0-30 days)  Pateint will report pain is a 2 on a scale of 1-10 in the next 30 days   [facility] CM Short Term Goal #1 Start Date  05/06/14

## 2014-05-07 ENCOUNTER — Other Ambulatory Visit: Payer: Self-pay | Admitting: Family Medicine

## 2014-05-07 DIAGNOSIS — S32010A Wedge compression fracture of first lumbar vertebra, initial encounter for closed fracture: Secondary | ICD-10-CM

## 2014-05-07 DIAGNOSIS — IMO0002 Reserved for concepts with insufficient information to code with codable children: Secondary | ICD-10-CM

## 2014-05-08 ENCOUNTER — Ambulatory Visit
Admission: RE | Admit: 2014-05-08 | Discharge: 2014-05-08 | Disposition: A | Discharge status: Home / Self Care | Payer: Medicare Other | Source: Ambulatory Visit | Attending: Family Medicine | Admitting: Family Medicine

## 2014-05-08 ENCOUNTER — Other Ambulatory Visit: Payer: Self-pay | Admitting: Family Medicine

## 2014-05-08 ENCOUNTER — Other Ambulatory Visit: Payer: Medicare Other

## 2014-05-08 DIAGNOSIS — M545 Low back pain, unspecified: Secondary | ICD-10-CM

## 2014-05-08 DIAGNOSIS — S32010A Wedge compression fracture of first lumbar vertebra, initial encounter for closed fracture: Secondary | ICD-10-CM

## 2014-05-08 DIAGNOSIS — IMO0002 Reserved for concepts with insufficient information to code with codable children: Secondary | ICD-10-CM

## 2014-05-08 DIAGNOSIS — S3992XA Unspecified injury of lower back, initial encounter: Secondary | ICD-10-CM | POA: Diagnosis not present

## 2014-05-08 DIAGNOSIS — M5126 Other intervertebral disc displacement, lumbar region: Secondary | ICD-10-CM | POA: Diagnosis not present

## 2014-05-08 MED ORDER — IOHEXOL 180 MG/ML  SOLN
1.0000 mL | Freq: Once | INTRAMUSCULAR | Status: AC | PRN
  Administered 2014-05-08: 1 mL via EPIDURAL

## 2014-05-08 MED ORDER — METHYLPREDNISOLONE ACETATE 40 MG/ML INJ SUSP (RADIOLOG
120.0000 mg | Freq: Once | INTRAMUSCULAR | Status: AC
  Administered 2014-05-08: 120 mg via EPIDURAL
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
## 2014-05-08 NOTE — Discharge Instructions (Signed)

## 2014-05-09 DIAGNOSIS — M159 Polyosteoarthritis, unspecified: Secondary | ICD-10-CM | POA: Diagnosis not present

## 2014-05-09 DIAGNOSIS — S32010D Wedge compression fracture of first lumbar vertebra, subsequent encounter for fracture with routine healing: Secondary | ICD-10-CM | POA: Diagnosis not present

## 2014-05-09 DIAGNOSIS — I10 Essential (primary) hypertension: Secondary | ICD-10-CM | POA: Diagnosis not present

## 2014-05-09 DIAGNOSIS — I2510 Atherosclerotic heart disease of native coronary artery without angina pectoris: Secondary | ICD-10-CM | POA: Diagnosis not present

## 2014-05-09 DIAGNOSIS — M810 Age-related osteoporosis without current pathological fracture: Secondary | ICD-10-CM | POA: Diagnosis not present

## 2014-05-12 ENCOUNTER — Other Ambulatory Visit: Payer: Self-pay | Admitting: *Deleted

## 2014-05-12 DIAGNOSIS — S32010D Wedge compression fracture of first lumbar vertebra, subsequent encounter for fracture with routine healing: Secondary | ICD-10-CM | POA: Diagnosis not present

## 2014-05-12 DIAGNOSIS — M159 Polyosteoarthritis, unspecified: Secondary | ICD-10-CM | POA: Diagnosis not present

## 2014-05-12 DIAGNOSIS — M810 Age-related osteoporosis without current pathological fracture: Secondary | ICD-10-CM | POA: Diagnosis not present

## 2014-05-12 DIAGNOSIS — I2510 Atherosclerotic heart disease of native coronary artery without angina pectoris: Secondary | ICD-10-CM | POA: Diagnosis not present

## 2014-05-12 DIAGNOSIS — I10 Essential (primary) hypertension: Secondary | ICD-10-CM | POA: Diagnosis not present

## 2014-05-12 NOTE — Patient Outreach (Signed)
[place] [facility]) Care Management  05/12/2014  Tracy Sharp [birth year] [id number]   Phone call to [name], patient's daughter in law.  Home visit scheduled for 05/15/14 to discuss placement options and respite care.  Home visit scheduled for  05/14/14 at 2:00pm.   [name] [facility] Care Management [phone]

## 2014-05-13 DIAGNOSIS — S32010D Wedge compression fracture of first lumbar vertebra, subsequent encounter for fracture with routine healing: Secondary | ICD-10-CM | POA: Diagnosis not present

## 2014-05-13 DIAGNOSIS — M810 Age-related osteoporosis without current pathological fracture: Secondary | ICD-10-CM | POA: Diagnosis not present

## 2014-05-13 DIAGNOSIS — M159 Polyosteoarthritis, unspecified: Secondary | ICD-10-CM | POA: Diagnosis not present

## 2014-05-13 DIAGNOSIS — I10 Essential (primary) hypertension: Secondary | ICD-10-CM | POA: Diagnosis not present

## 2014-05-13 DIAGNOSIS — I2510 Atherosclerotic heart disease of native coronary artery without angina pectoris: Secondary | ICD-10-CM | POA: Diagnosis not present

## 2014-05-14 ENCOUNTER — Other Ambulatory Visit: Payer: Self-pay | Admitting: *Deleted

## 2014-05-15 DIAGNOSIS — S32010D Wedge compression fracture of first lumbar vertebra, subsequent encounter for fracture with routine healing: Secondary | ICD-10-CM | POA: Diagnosis not present

## 2014-05-15 DIAGNOSIS — M159 Polyosteoarthritis, unspecified: Secondary | ICD-10-CM | POA: Diagnosis not present

## 2014-05-15 DIAGNOSIS — I10 Essential (primary) hypertension: Secondary | ICD-10-CM | POA: Diagnosis not present

## 2014-05-15 DIAGNOSIS — I2510 Atherosclerotic heart disease of native coronary artery without angina pectoris: Secondary | ICD-10-CM | POA: Diagnosis not present

## 2014-05-15 DIAGNOSIS — M810 Age-related osteoporosis without current pathological fracture: Secondary | ICD-10-CM | POA: Diagnosis not present

## 2014-05-15 NOTE — Patient Outreach (Signed)
[place] [facility]) Care Management  [facility] Social Work  05/15/2014  Tracy Sharp November 27, 1924 [id number]      Current Medications:  Current Outpatient Prescriptions  Medication Sig Dispense Refill  . acetaminophen (TYLENOL) 500 MG tablet Take 1,000 mg by mouth every 6 (six) hours as needed for moderate pain (arthritis pain).     . Artificial Tear Ointment (EYE LUBRICANT OP) Apply to eye at bedtime.    Marland Kitchen aspirin 81 MG tablet Take 81 mg by mouth daily.      Marland Kitchen CALCIUM PO Take 30 mLs by mouth daily.    . cholecalciferol (VITAMIN D) 1000 UNITS tablet Take 2,000 Units by mouth daily.     Marland Kitchen docusate sodium (COLACE) 100 MG capsule Take 100 mg by mouth 2 (two) times daily.    . DULoxetine (CYMBALTA) 30 MG capsule Take 30 mg by mouth daily.    . Hypromellose (ARTIFICIAL TEARS OP) Place 2 drops into both eyes 2 (two) times daily.    . lansoprazole (PREVACID) 30 MG capsule Take 30 mg by mouth daily at 12 noon.    . Melatonin 5 MG TABS Take 7.5 mg by mouth at bedtime.    . meloxicam (MOBIC) 15 MG tablet Take 15 mg by mouth Daily.     . mirabegron ER (MYRBETRIQ) 50 MG TB24 tablet Take 50 mg by mouth at bedtime.     . traMADol (ULTRAM) 50 MG tablet Take 1 tablet (50 mg total) by mouth every 6 (six) hours as needed for moderate pain or severe pain. 30 tablet 0   No current facility-administered medications for this visit.    Functional Status:  In your present state of health, do you have any difficulty performing the following activities: 05/06/2014 01/03/2014  Hearing? Tempie Donning  Vision? N N  Difficulty concentrating or making decisions? - N  Walking or climbing stairs? Y Y  Dressing or bathing? Y N  Doing errands, shopping? Y -  Conservation officer, nature and eating ? Y -  Using the Toilet? Y -  In the past six months, have you accidently leaked urine? Y -  Do you have problems with loss of bowel control? Y -  Managing your Medications? Y -  Managing your Finances? Y -  Housekeeping or managing your  Housekeeping? Y -    Fall/Depression Screening:  PHQ 2/9 Scores 05/06/2014  PHQ - 2 Score 6  PHQ- 9 Score 10    Assessment: This Education officer, museum met with patient and patient's daughter in law [name] to provide caregiver support and community resources.  Patient's primary caregiver is her daughter in law [name] who will be undergoing a surgical procedure soon that will affect her care taking responsibilities .  Options for patient care during this time explored (personal care services, respite care).  Area day programs also discussed to increase patient's socialization.  Per patient she was participating in the lunch program at the senior center, however has not returned since her return from the ED. Per patient, she would like to resume her outside activities. Long-term  facility care also discussed, however per daughter in law, patient reluctant to agree to assisted living.  Emotional support provided to daughter in law, self care emphasized.  Plan:  Daughter in law given resources for respite care and in home personal care assistance.  Daughter in law also given resources for day programs to increase her socialization. A list of [facility] assisted living facilities also provided.  Resource  list for  [facility] facilities will be forwarded as well to daughter in Sports coach.      [name] [facility] Care Management [phone]

## 2014-05-16 DIAGNOSIS — M810 Age-related osteoporosis without current pathological fracture: Secondary | ICD-10-CM | POA: Diagnosis not present

## 2014-05-16 DIAGNOSIS — I2510 Atherosclerotic heart disease of native coronary artery without angina pectoris: Secondary | ICD-10-CM | POA: Diagnosis not present

## 2014-05-16 DIAGNOSIS — M159 Polyosteoarthritis, unspecified: Secondary | ICD-10-CM | POA: Diagnosis not present

## 2014-05-16 DIAGNOSIS — S32010D Wedge compression fracture of first lumbar vertebra, subsequent encounter for fracture with routine healing: Secondary | ICD-10-CM | POA: Diagnosis not present

## 2014-05-16 DIAGNOSIS — I10 Essential (primary) hypertension: Secondary | ICD-10-CM | POA: Diagnosis not present

## 2014-05-20 DIAGNOSIS — M810 Age-related osteoporosis without current pathological fracture: Secondary | ICD-10-CM | POA: Diagnosis not present

## 2014-05-20 DIAGNOSIS — S32010D Wedge compression fracture of first lumbar vertebra, subsequent encounter for fracture with routine healing: Secondary | ICD-10-CM | POA: Diagnosis not present

## 2014-05-20 DIAGNOSIS — I2510 Atherosclerotic heart disease of native coronary artery without angina pectoris: Secondary | ICD-10-CM | POA: Diagnosis not present

## 2014-05-20 DIAGNOSIS — I10 Essential (primary) hypertension: Secondary | ICD-10-CM | POA: Diagnosis not present

## 2014-05-20 DIAGNOSIS — M159 Polyosteoarthritis, unspecified: Secondary | ICD-10-CM | POA: Diagnosis not present

## 2014-05-22 DIAGNOSIS — L97509 Non-pressure chronic ulcer of other part of unspecified foot with unspecified severity: Secondary | ICD-10-CM | POA: Diagnosis not present

## 2014-05-22 DIAGNOSIS — F419 Anxiety disorder, unspecified: Secondary | ICD-10-CM | POA: Diagnosis not present

## 2014-05-22 DIAGNOSIS — M545 Low back pain: Secondary | ICD-10-CM | POA: Diagnosis not present

## 2014-05-27 DIAGNOSIS — I2510 Atherosclerotic heart disease of native coronary artery without angina pectoris: Secondary | ICD-10-CM | POA: Diagnosis not present

## 2014-05-27 DIAGNOSIS — M159 Polyosteoarthritis, unspecified: Secondary | ICD-10-CM | POA: Diagnosis not present

## 2014-05-27 DIAGNOSIS — S32010D Wedge compression fracture of first lumbar vertebra, subsequent encounter for fracture with routine healing: Secondary | ICD-10-CM | POA: Diagnosis not present

## 2014-05-27 DIAGNOSIS — I10 Essential (primary) hypertension: Secondary | ICD-10-CM | POA: Diagnosis not present

## 2014-05-27 DIAGNOSIS — M810 Age-related osteoporosis without current pathological fracture: Secondary | ICD-10-CM | POA: Diagnosis not present

## 2014-05-28 DIAGNOSIS — S32010D Wedge compression fracture of first lumbar vertebra, subsequent encounter for fracture with routine healing: Secondary | ICD-10-CM | POA: Diagnosis not present

## 2014-05-28 DIAGNOSIS — M159 Polyosteoarthritis, unspecified: Secondary | ICD-10-CM | POA: Diagnosis not present

## 2014-05-28 DIAGNOSIS — I2510 Atherosclerotic heart disease of native coronary artery without angina pectoris: Secondary | ICD-10-CM | POA: Diagnosis not present

## 2014-05-28 DIAGNOSIS — M810 Age-related osteoporosis without current pathological fracture: Secondary | ICD-10-CM | POA: Diagnosis not present

## 2014-05-28 DIAGNOSIS — I10 Essential (primary) hypertension: Secondary | ICD-10-CM | POA: Diagnosis not present

## 2014-05-29 DIAGNOSIS — I2510 Atherosclerotic heart disease of native coronary artery without angina pectoris: Secondary | ICD-10-CM | POA: Diagnosis not present

## 2014-05-29 DIAGNOSIS — M810 Age-related osteoporosis without current pathological fracture: Secondary | ICD-10-CM | POA: Diagnosis not present

## 2014-05-29 DIAGNOSIS — I10 Essential (primary) hypertension: Secondary | ICD-10-CM | POA: Diagnosis not present

## 2014-05-29 DIAGNOSIS — M159 Polyosteoarthritis, unspecified: Secondary | ICD-10-CM | POA: Diagnosis not present

## 2014-05-29 DIAGNOSIS — S32010D Wedge compression fracture of first lumbar vertebra, subsequent encounter for fracture with routine healing: Secondary | ICD-10-CM | POA: Diagnosis not present

## 2014-05-31 DIAGNOSIS — I2510 Atherosclerotic heart disease of native coronary artery without angina pectoris: Secondary | ICD-10-CM | POA: Diagnosis not present

## 2014-05-31 DIAGNOSIS — S32010D Wedge compression fracture of first lumbar vertebra, subsequent encounter for fracture with routine healing: Secondary | ICD-10-CM | POA: Diagnosis not present

## 2014-05-31 DIAGNOSIS — I10 Essential (primary) hypertension: Secondary | ICD-10-CM | POA: Diagnosis not present

## 2014-05-31 DIAGNOSIS — M810 Age-related osteoporosis without current pathological fracture: Secondary | ICD-10-CM | POA: Diagnosis not present

## 2014-05-31 DIAGNOSIS — M159 Polyosteoarthritis, unspecified: Secondary | ICD-10-CM | POA: Diagnosis not present

## 2014-06-04 DIAGNOSIS — M810 Age-related osteoporosis without current pathological fracture: Secondary | ICD-10-CM | POA: Diagnosis not present

## 2014-06-04 DIAGNOSIS — I10 Essential (primary) hypertension: Secondary | ICD-10-CM | POA: Diagnosis not present

## 2014-06-04 DIAGNOSIS — I2510 Atherosclerotic heart disease of native coronary artery without angina pectoris: Secondary | ICD-10-CM | POA: Diagnosis not present

## 2014-06-04 DIAGNOSIS — M159 Polyosteoarthritis, unspecified: Secondary | ICD-10-CM | POA: Diagnosis not present

## 2014-06-04 DIAGNOSIS — S32010D Wedge compression fracture of first lumbar vertebra, subsequent encounter for fracture with routine healing: Secondary | ICD-10-CM | POA: Diagnosis not present

## 2014-06-05 ENCOUNTER — Other Ambulatory Visit: Payer: Self-pay | Admitting: *Deleted

## 2014-06-05 VITALS — BP 132/72 | HR 68 | Resp 16 | Ht 66.0 in | Wt 170.0 lb

## 2014-06-05 DIAGNOSIS — Z9181 History of falling: Secondary | ICD-10-CM

## 2014-06-05 NOTE — Patient Outreach (Signed)
Tracy Sharp) Care Management  [place]  06/05/2014   Tracy Sharp 06/19/1924 [id number]  Subjective: Pt states yesterday HH PT said BP up, 150's/94.  Pt states she is not on any meds for BP. Pt states HH PT is coming two times a week, is helping, no falls since last RN CM's (nurse Claiborne Sharp) home Visit.  Pt states daughter in law Tracy Sharp fixes weekly med box.  Tracy Sharp (present during most of home visit States pt has been on medicine for anxiety for past 3 weeks, helping.  Pt states sob when overexert, uses Breather received from Speech therapist, 1-2 times a week, helps.    Objective: Lungs clear.  No c/o pain.  Filed Vitals:   06/05/14 1329  BP: 132/72  Pulse: 68  Resp: 16    Current Medications:  Current Outpatient Prescriptions  Medication Sig Dispense Refill  . acetaminophen (TYLENOL) 500 MG tablet Take 1,000 mg by mouth every 6 (six) hours as needed for moderate pain (arthritis pain).     . Artificial Tear Ointment (EYE LUBRICANT OP) Apply to eye at bedtime.    Marland Kitchen aspirin 81 MG tablet Take 81 mg by mouth daily.      Marland Kitchen CALCIUM PO Take 30 mLs by mouth daily.    . cholecalciferol (VITAMIN D) 1000 UNITS tablet Take 2,000 Units by mouth daily.     Marland Kitchen docusate sodium (COLACE) 100 MG capsule Take 100 mg by mouth daily.    . DULoxetine (CYMBALTA) 30 MG capsule Take 30 mg by mouth daily.    . Hypromellose (ARTIFICIAL TEARS OP) Place 2 drops into both eyes 2 (two) times daily.    . lansoprazole (PREVACID) 30 MG capsule Take 30 mg by mouth daily at 12 noon.    Marland Kitchen LORazepam (ATIVAN) 0.5 MG tablet Take 0.5 mg by mouth as needed. 1/2 to one tablet as needed for anxiety    . Melatonin 5 MG TABS Take 7.5 mg by mouth at bedtime.    . meloxicam (MOBIC) 15 MG tablet Take 15 mg by mouth Daily.     . mirabegron ER (MYRBETRIQ) 50 MG TB24 tablet Take 50 mg by mouth at bedtime.     . traMADol (ULTRAM) 50 MG tablet Take 1 tablet (50 mg total) by mouth every 6 (six) hours as needed  for moderate pain or severe pain. 30 tablet 0  . docusate sodium (COLACE) 100 MG capsule Take 100 mg by mouth 2 (two) times daily.     No current facility-administered medications for this visit.      FallsDepression Screening: PHQ 2/9 Scores 05/06/2014  PHQ - 2 Score 6  PHQ- 9 Score 10    Assessment:  Fall risk- no falls since last one 4 weeks ago,HHPT coming twice a week, pt reports                          Helping. Using rollator, family checks on pt [place].   Plan:  Pt to continue to use rollator.            Plan to discharge pt from RN CM services, goals met            Plan to inform [facility] SW who is still involved of RN CM discharge.    [facility] CM Care Plan        Most Recent Value   Problem One    Care Plan Problem One  Risk for Falls r/t age, use of walker, pain and history of falls   Role Documenting the Problem One  Care Management Coordinator   [facility] CM Care Plan Problem One     Care Plan for Problem One  Active   Patient Has Long Term Goal?  Yes   [facility] Long Term Goal (31-90 days)  Patient will not fall in the next 31 days   [facility] Long Term Goal Start Date  05/06/14   [facility] Long Term Goal Met Date  06/05/14   Interventions for Problem One Long Term Goal  1) Use walker for ambulation 2) Wear shoes or no skid socks when ambulating 3)Use arms of chair for support when transfering from sit to stand 4) use guard rails for toileting and showering 5) Do not attempt to walk if experiencing weakness or dizziness- Sit and rest and call for assistance   Problem One Short Term Goals    Number of Short Term Goals for Problem One  One   Short Term Goal #1    [facility] CM Short Term Goal #1 (0-30 days)  Patient will participate in home physical therapy for the next 30 days   [facility] CM Short Term Goal #1 Start Date  05/06/14   [facility] CM Short Term Goal #1 Met Date  06/05/14   Interventions for Short Term Goal #1  1) Participates in initial PT home visit 05/07/14 2) [place] home exercise  program provided by therapist 3) Daughter in law will assure patient understand therapy directions   Short Term Goal #2    Short Term Goal #3    Short Term Goal #4    Short Tern Goal #5    Problem Two    Care Plan Problem Two  Pain   Role Documenting the Problem Two  Care Management Coordinator   [facility] CM Care Plan Problem Two    Care Plan for Problem Two  Active   Problem Two Short Term Goals    Number of Short Term Goal for Problem Two  One   Short Term Goal #1    [facility] CM Short Term Goal #1 (0-30 days)  Pateint will report pain is a 2 on a scale of 1-10 in the next 30 days   [facility] CM Short Term Goal #1 Start Date  05/06/14   [facility] CM Short Term Goal #1 Met Date   06/05/14   Interventions for Short Term Goal #2   1) Take Ultram as ordered 2) Take Tylenol as ordered for breakthrough pain 3) Daughter in law to assist with heating application as needed 4) Attend [facility] appointment when scheduled 5) F/U with Dr. Joya Salm when scheduled   Short Term Goal #2    Short Term Goal #3    Short Term Goal #4    Short Term Goal #5    Problem Three    [facility] CM Care Plan Problem Three    Problem Three Short Term Goals    Short Term Goal #1    Short Term Goal #2    Short Term Goal #3    Short Term Goal #4    Short Term Goal #5      Tracy Sharp M.   [place] Care Management  [phone]

## 2014-06-06 DIAGNOSIS — I2510 Atherosclerotic heart disease of native coronary artery without angina pectoris: Secondary | ICD-10-CM | POA: Diagnosis not present

## 2014-06-06 DIAGNOSIS — I10 Essential (primary) hypertension: Secondary | ICD-10-CM | POA: Diagnosis not present

## 2014-06-06 DIAGNOSIS — S32010D Wedge compression fracture of first lumbar vertebra, subsequent encounter for fracture with routine healing: Secondary | ICD-10-CM | POA: Diagnosis not present

## 2014-06-06 DIAGNOSIS — M159 Polyosteoarthritis, unspecified: Secondary | ICD-10-CM | POA: Diagnosis not present

## 2014-06-06 DIAGNOSIS — M810 Age-related osteoporosis without current pathological fracture: Secondary | ICD-10-CM | POA: Diagnosis not present

## 2014-06-06 NOTE — Patient Outreach (Deleted)
[facility]) Care Management  [place]  06/06/2014   Tracy Sharp 1924-04-06 [id number]  Subjective:  Pt states yesterday HH PT said BP up, 150's/94.  Pt states she is not on any meds for  BP.  Pt states HH PT is coming two times a week, is  helping, no falls since last RN CM's (nurse Claiborne Billings) home  Visit.   Pt states daughter in law Tracy Sharp fixes weekly med box.  Tracy Sharp (present during most of visit) States pt has been on anxiety med for past 3 weeks, helping.  Pt states she is sob when over exert, uses  Breather received from Speech therapist 1-2 times a week, helps.   Objective:  Lungs clear, no c/o pain.  Filed Vitals:   06/05/14 1329  BP: 132/72  Pulse: 68  Resp: 16    Current Medications:  Current Outpatient Prescriptions  Medication Sig Dispense Refill  . acetaminophen (TYLENOL) 500 MG tablet Take 1,000 mg by mouth every 6 (six) hours as needed for moderate pain (arthritis pain).     . Artificial Tear Ointment (EYE LUBRICANT OP) Apply to eye at bedtime.    Marland Kitchen aspirin 81 MG tablet Take 81 mg by mouth daily.      Marland Kitchen CALCIUM PO Take 30 mLs by mouth daily.    . cholecalciferol (VITAMIN D) 1000 UNITS tablet Take 2,000 Units by mouth daily.     Marland Kitchen docusate sodium (COLACE) 100 MG capsule Take 100 mg by mouth daily.    . DULoxetine (CYMBALTA) 30 MG capsule Take 30 mg by mouth daily.    . Hypromellose (ARTIFICIAL TEARS OP) Place 2 drops into both eyes 2 (two) times daily.    . lansoprazole (PREVACID) 30 MG capsule Take 30 mg by mouth daily at 12 noon.    Marland Kitchen LORazepam (ATIVAN) 0.5 MG tablet Take 0.5 mg by mouth as needed. 1/2 to one tablet as needed for anxiety    . Melatonin 5 MG TABS Take 7.5 mg by mouth at bedtime.    . meloxicam (MOBIC) 15 MG tablet Take 15 mg by mouth Daily.     . mirabegron ER (MYRBETRIQ) 50 MG TB24 tablet Take 50 mg by mouth at bedtime.     . traMADol (ULTRAM) 50 MG tablet Take 1 tablet (50 mg total) by mouth every 6 (six) hours as needed  for moderate pain or severe pain. 30 tablet 0  . docusate sodium (COLACE) 100 MG capsule Take 100 mg by mouth 2 (two) times daily.     No current facility-administered medications for this visit.    Assessment:  Fall risk- no falls since last one 4 weeks ago, HH PT coming twice a week, pt                          Reports helping.  Using rollator, family checks on pt daily, has [employer].   Plan:  Pt to continue to use rollator.              Plan to discharge pt from RN CM services, goals met.             Plan to inform [place] [facility] SW who is still involved of RN CM discharge.                 [facility] CM Care Plan        Most Recent Value   Problem One  Care Plan Problem One  Risk for Falls r/t age, use of walker, pain aeb history of falls   Role Documenting the Problem One  Care Management Coordinator   [facility] CM Care Plan Problem One     Care Plan for Problem One  Active   Patient Has Long Term Goal?  Yes   [facility] Long Term Goal (31-90 days)  Patient will not fall in the next 31 days   [facility] Long Term Goal Start Date  05/06/14   [facility] Long Term Goal Met Date  06/05/14   Interventions for Problem One Long Term Goal  1) Use walker for ambulation 2) Wear shoes or no skid socks when ambulating 3)Use arms of chair for support when transfering from sit to stand 4) use guard rails for toileting and showering 5) Do not attempt to walk if experiencing weakness or dizziness- Sit and rest and call for assistance   Problem One Short Term Goals    Number of Short Term Goals for Problem One  One   Short Term Goal #1    [facility] CM Short Term Goal #1 (0-30 days)  Patient will participate in home physical therapy for the next 30 days   [facility] CM Short Term Goal #1 Start Date  05/06/14   [facility] CM Short Term Goal #1 Met Date  06/05/14   Interventions for Short Term Goal #1  1) Participates in initial PT home visit 05/07/14 2) [place] home exercise program provided by therapist 3) Daughter in law will assure patient  understand therapy directions   Short Term Goal #2    Short Term Goal #3    Short Term Goal #4    Short Tern Goal #5    Problem Two    Care Plan Problem Two  Pain   Role Documenting the Problem Two  Care Management Coordinator   [facility] CM Care Plan Problem Two    Care Plan for Problem Two  Active   Problem Two Short Term Goals    Number of Short Term Goal for Problem Two  One   Short Term Goal #1    [facility] CM Short Term Goal #1 (0-30 days)  Pateint will report pain is a 2 on a scale of 1-10 in the next 30 days   [facility] CM Short Term Goal #1 Start Date  05/06/14   [facility] CM Short Term Goal #1 Met Date   06/05/14   Interventions for Short Term Goal #2   1) Take Ultram as ordered 2) Take Tylenol as ordered for breakthrough pain 3) Daughter in law to assist with heating application as needed 4) Attend [place] imaging appointment when scheduled 5) F/U with Dr. Joya Salm when scheduled   Short Term Goal #2    Short Term Goal #3    Short Term Goal #4    Short Term Goal #5    Problem Three    [facility] CM Care Plan Problem Three    Problem Three Short Term Goals    Short Term Goal #1    Short Term Goal #2    Short Term Goal #3    Short Term Goal #4    Short Term Goal #5      Tracy M.   Sharp Care Management  [phone]

## 2014-06-09 ENCOUNTER — Other Ambulatory Visit: Payer: Self-pay | Admitting: *Deleted

## 2014-06-09 NOTE — Patient Outreach (Addendum)
[facility]) Care Management  06/09/2014  ERNIE SAGRERO [birth year] [id number]    Phone call to patient's daughter in law to follow up on referrals provided during last visit and to assess for continued social work needs.  Additional resources for Assisted Living, [facility] and [facility] emailed to daughter in law as well.  Phone call to [facility].  Voice mail message left for a return call regarding respite care hours.  [name] [facility] Care Management [phone]

## 2014-06-10 ENCOUNTER — Encounter: Payer: Self-pay | Admitting: *Deleted

## 2014-06-10 ENCOUNTER — Other Ambulatory Visit: Payer: Self-pay | Admitting: *Deleted

## 2014-06-10 DIAGNOSIS — S32010D Wedge compression fracture of first lumbar vertebra, subsequent encounter for fracture with routine healing: Secondary | ICD-10-CM | POA: Diagnosis not present

## 2014-06-10 DIAGNOSIS — I2510 Atherosclerotic heart disease of native coronary artery without angina pectoris: Secondary | ICD-10-CM | POA: Diagnosis not present

## 2014-06-10 DIAGNOSIS — M159 Polyosteoarthritis, unspecified: Secondary | ICD-10-CM | POA: Diagnosis not present

## 2014-06-10 DIAGNOSIS — M810 Age-related osteoporosis without current pathological fracture: Secondary | ICD-10-CM | POA: Diagnosis not present

## 2014-06-10 DIAGNOSIS — I10 Essential (primary) hypertension: Secondary | ICD-10-CM | POA: Diagnosis not present

## 2014-06-10 NOTE — Patient Outreach (Signed)
[place] [facility]) Care Management  06/10/2014  RIVKY CLENDENNING 1924-02-28 [id number]    Follow up phone call to assess for continued social work needs.  Spoke with patient's daughter in law, [name] who confirmed receipt of Assisted Living, Respite Care, and family home care.  List of personal care service agencies also received.  Resources also provided for respite care (30 hours a year)  through [place] and [employer] [phone]. At this time no decisions have been made regarding placement or respite care, however daugther in law is appreciative of the resources provided.  Patient assessed, no further social work needs noted.  Patient's case to be closed to social work.     [name] [facility] Care Management [phone]

## 2014-06-11 DIAGNOSIS — S32010D Wedge compression fracture of first lumbar vertebra, subsequent encounter for fracture with routine healing: Secondary | ICD-10-CM | POA: Diagnosis not present

## 2014-06-11 DIAGNOSIS — M159 Polyosteoarthritis, unspecified: Secondary | ICD-10-CM | POA: Diagnosis not present

## 2014-06-11 DIAGNOSIS — I10 Essential (primary) hypertension: Secondary | ICD-10-CM | POA: Diagnosis not present

## 2014-06-11 DIAGNOSIS — I2510 Atherosclerotic heart disease of native coronary artery without angina pectoris: Secondary | ICD-10-CM | POA: Diagnosis not present

## 2014-06-11 DIAGNOSIS — M810 Age-related osteoporosis without current pathological fracture: Secondary | ICD-10-CM | POA: Diagnosis not present

## 2014-06-12 DIAGNOSIS — S32010D Wedge compression fracture of first lumbar vertebra, subsequent encounter for fracture with routine healing: Secondary | ICD-10-CM | POA: Diagnosis not present

## 2014-06-12 DIAGNOSIS — M159 Polyosteoarthritis, unspecified: Secondary | ICD-10-CM | POA: Diagnosis not present

## 2014-06-12 DIAGNOSIS — I2510 Atherosclerotic heart disease of native coronary artery without angina pectoris: Secondary | ICD-10-CM | POA: Diagnosis not present

## 2014-06-12 DIAGNOSIS — M810 Age-related osteoporosis without current pathological fracture: Secondary | ICD-10-CM | POA: Diagnosis not present

## 2014-06-12 DIAGNOSIS — I10 Essential (primary) hypertension: Secondary | ICD-10-CM | POA: Diagnosis not present

## 2014-06-12 NOTE — Patient Outreach (Signed)
[place] [facility]) Care Management  06/12/2014  DESSA LEDEE 23-Jan-1925 [id number]   Received notification from [place], [place] and Kathie Rhodes, RN to close case due to goal met.  Ronnell Freshwater. [place] CM Assistant Phone: [phone] Fax: [phone]

## 2014-06-13 DIAGNOSIS — I10 Essential (primary) hypertension: Secondary | ICD-10-CM | POA: Diagnosis not present

## 2014-06-13 DIAGNOSIS — M810 Age-related osteoporosis without current pathological fracture: Secondary | ICD-10-CM | POA: Diagnosis not present

## 2014-06-13 DIAGNOSIS — I2510 Atherosclerotic heart disease of native coronary artery without angina pectoris: Secondary | ICD-10-CM | POA: Diagnosis not present

## 2014-06-13 DIAGNOSIS — M159 Polyosteoarthritis, unspecified: Secondary | ICD-10-CM | POA: Diagnosis not present

## 2014-06-13 DIAGNOSIS — S32010D Wedge compression fracture of first lumbar vertebra, subsequent encounter for fracture with routine healing: Secondary | ICD-10-CM | POA: Diagnosis not present

## 2014-06-16 DIAGNOSIS — M159 Polyosteoarthritis, unspecified: Secondary | ICD-10-CM | POA: Diagnosis not present

## 2014-06-16 DIAGNOSIS — S32010D Wedge compression fracture of first lumbar vertebra, subsequent encounter for fracture with routine healing: Secondary | ICD-10-CM | POA: Diagnosis not present

## 2014-06-16 DIAGNOSIS — M810 Age-related osteoporosis without current pathological fracture: Secondary | ICD-10-CM | POA: Diagnosis not present

## 2014-06-16 DIAGNOSIS — I2510 Atherosclerotic heart disease of native coronary artery without angina pectoris: Secondary | ICD-10-CM | POA: Diagnosis not present

## 2014-06-16 DIAGNOSIS — I10 Essential (primary) hypertension: Secondary | ICD-10-CM | POA: Diagnosis not present

## 2014-06-17 DIAGNOSIS — I2510 Atherosclerotic heart disease of native coronary artery without angina pectoris: Secondary | ICD-10-CM | POA: Diagnosis not present

## 2014-06-17 DIAGNOSIS — M159 Polyosteoarthritis, unspecified: Secondary | ICD-10-CM | POA: Diagnosis not present

## 2014-06-17 DIAGNOSIS — M810 Age-related osteoporosis without current pathological fracture: Secondary | ICD-10-CM | POA: Diagnosis not present

## 2014-06-17 DIAGNOSIS — S32010D Wedge compression fracture of first lumbar vertebra, subsequent encounter for fracture with routine healing: Secondary | ICD-10-CM | POA: Diagnosis not present

## 2014-06-17 DIAGNOSIS — I10 Essential (primary) hypertension: Secondary | ICD-10-CM | POA: Diagnosis not present

## 2014-06-19 DIAGNOSIS — S32010D Wedge compression fracture of first lumbar vertebra, subsequent encounter for fracture with routine healing: Secondary | ICD-10-CM | POA: Diagnosis not present

## 2014-06-19 DIAGNOSIS — I2510 Atherosclerotic heart disease of native coronary artery without angina pectoris: Secondary | ICD-10-CM | POA: Diagnosis not present

## 2014-06-19 DIAGNOSIS — I10 Essential (primary) hypertension: Secondary | ICD-10-CM | POA: Diagnosis not present

## 2014-06-19 DIAGNOSIS — M810 Age-related osteoporosis without current pathological fracture: Secondary | ICD-10-CM | POA: Diagnosis not present

## 2014-06-19 DIAGNOSIS — M159 Polyosteoarthritis, unspecified: Secondary | ICD-10-CM | POA: Diagnosis not present

## 2014-06-20 ENCOUNTER — Emergency Department (HOSPITAL_COMMUNITY)
Admission: EM | Admit: 2014-06-20 | Discharge: 2014-06-20 | Disposition: A | Discharge status: Home / Self Care | Payer: Medicare Other | Attending: Emergency Medicine | Admitting: Emergency Medicine

## 2014-06-20 ENCOUNTER — Emergency Department (HOSPITAL_COMMUNITY): Payer: Medicare Other

## 2014-06-20 ENCOUNTER — Encounter (HOSPITAL_COMMUNITY): Payer: Self-pay | Admitting: Emergency Medicine

## 2014-06-20 DIAGNOSIS — Z974 Presence of external hearing-aid: Secondary | ICD-10-CM | POA: Insufficient documentation

## 2014-06-20 DIAGNOSIS — E663 Overweight: Secondary | ICD-10-CM | POA: Insufficient documentation

## 2014-06-20 DIAGNOSIS — S79911A Unspecified injury of right hip, initial encounter: Secondary | ICD-10-CM | POA: Insufficient documentation

## 2014-06-20 DIAGNOSIS — S8991XA Unspecified injury of right lower leg, initial encounter: Secondary | ICD-10-CM | POA: Insufficient documentation

## 2014-06-20 DIAGNOSIS — M1990 Unspecified osteoarthritis, unspecified site: Secondary | ICD-10-CM | POA: Diagnosis not present

## 2014-06-20 DIAGNOSIS — F329 Major depressive disorder, single episode, unspecified: Secondary | ICD-10-CM | POA: Insufficient documentation

## 2014-06-20 DIAGNOSIS — Z973 Presence of spectacles and contact lenses: Secondary | ICD-10-CM | POA: Diagnosis not present

## 2014-06-20 DIAGNOSIS — Z96651 Presence of right artificial knee joint: Secondary | ICD-10-CM | POA: Diagnosis not present

## 2014-06-20 DIAGNOSIS — K2970 Gastritis, unspecified, without bleeding: Secondary | ICD-10-CM | POA: Insufficient documentation

## 2014-06-20 DIAGNOSIS — I10 Essential (primary) hypertension: Secondary | ICD-10-CM | POA: Insufficient documentation

## 2014-06-20 DIAGNOSIS — G8929 Other chronic pain: Secondary | ICD-10-CM | POA: Diagnosis not present

## 2014-06-20 DIAGNOSIS — Z79899 Other long term (current) drug therapy: Secondary | ICD-10-CM | POA: Insufficient documentation

## 2014-06-20 DIAGNOSIS — Y93E2 Activity, laundry: Secondary | ICD-10-CM | POA: Insufficient documentation

## 2014-06-20 DIAGNOSIS — Y929 Unspecified place or not applicable: Secondary | ICD-10-CM | POA: Diagnosis not present

## 2014-06-20 DIAGNOSIS — Y998 Other external cause status: Secondary | ICD-10-CM | POA: Insufficient documentation

## 2014-06-20 DIAGNOSIS — Z791 Long term (current) use of non-steroidal anti-inflammatories (NSAID): Secondary | ICD-10-CM | POA: Diagnosis not present

## 2014-06-20 DIAGNOSIS — Z7982 Long term (current) use of aspirin: Secondary | ICD-10-CM | POA: Insufficient documentation

## 2014-06-20 DIAGNOSIS — M25561 Pain in right knee: Secondary | ICD-10-CM | POA: Diagnosis not present

## 2014-06-20 DIAGNOSIS — W19XXXA Unspecified fall, initial encounter: Secondary | ICD-10-CM

## 2014-06-20 DIAGNOSIS — Z972 Presence of dental prosthetic device (complete) (partial): Secondary | ICD-10-CM | POA: Diagnosis not present

## 2014-06-20 DIAGNOSIS — W1839XA Other fall on same level, initial encounter: Secondary | ICD-10-CM | POA: Insufficient documentation

## 2014-06-20 DIAGNOSIS — F419 Anxiety disorder, unspecified: Secondary | ICD-10-CM | POA: Insufficient documentation

## 2014-06-20 DIAGNOSIS — S3992XA Unspecified injury of lower back, initial encounter: Secondary | ICD-10-CM | POA: Insufficient documentation

## 2014-06-20 DIAGNOSIS — I2510 Atherosclerotic heart disease of native coronary artery without angina pectoris: Secondary | ICD-10-CM | POA: Diagnosis not present

## 2014-06-20 DIAGNOSIS — R52 Pain, unspecified: Secondary | ICD-10-CM | POA: Diagnosis not present

## 2014-06-20 DIAGNOSIS — S3993XA Unspecified injury of pelvis, initial encounter: Secondary | ICD-10-CM | POA: Diagnosis not present

## 2014-06-20 DIAGNOSIS — M25551 Pain in right hip: Secondary | ICD-10-CM | POA: Diagnosis not present

## 2014-06-20 DIAGNOSIS — M545 Low back pain: Secondary | ICD-10-CM | POA: Diagnosis not present

## 2014-06-20 LAB — BASIC METABOLIC PANEL
Anion gap: 8 (ref 5–15)
BUN: 37 mg/dL — AB (ref 6–20)
CO2: 29 mmol/L (ref 22–32)
Calcium: 9.7 mg/dL (ref 8.9–10.3)
Chloride: 104 mmol/L (ref 101–111)
Creatinine, Ser: 0.73 mg/dL (ref 0.44–1.00)
GFR calc Af Amer: >60 mL/min (ref >60)
Glucose, Bld: 112 mg/dL — ABNORMAL HIGH (ref 65–99)
Potassium: 4.9 mmol/L (ref 3.5–5.1)
Sodium: 141 mmol/L (ref 135–145)

## 2014-06-20 LAB — CBC WITH DIFFERENTIAL/PLATELET
Basophils Absolute: 0 10*3/uL (ref 0.0–0.1)
Basophils Relative: 0 % (ref 0–1)
EOS ABS: 0.1 10*3/uL (ref 0.0–0.7)
Eosinophils Relative: 2 % (ref 0–5)
HCT: 40.8 % (ref 36.0–46.0)
Hemoglobin: 13.1 g/dL (ref 12.0–15.0)
LYMPHS ABS: 1.4 10*3/uL (ref 0.7–4.0)
Lymphocytes Relative: 20 % (ref 12–46)
MCH: 30.4 pg (ref 26.0–34.0)
MCHC: 32.1 g/dL (ref 30.0–36.0)
MCV: 94.7 fL (ref 78.0–100.0)
Monocytes Absolute: 0.6 10*3/uL (ref 0.1–1.0)
Monocytes Relative: 9 % (ref 3–12)
NEUTROS PCT: 69 % (ref 43–77)
Neutro Abs: 4.7 10*3/uL (ref 1.7–7.7)
PLATELETS: 190 10*3/uL (ref 150–400)
RBC: 4.31 MIL/uL (ref 3.87–5.11)
RDW: 13.1 % (ref 11.5–15.5)
WBC: 6.8 10*3/uL (ref 4.0–10.5)

## 2014-06-20 MED ORDER — HYDROCODONE-ACETAMINOPHEN 5-325 MG PO TABS
2.0000 | ORAL_TABLET | Freq: Once | ORAL | Status: AC
  Administered 2014-06-20: 2 via ORAL
  Filled 2014-06-20: qty 2

## 2014-06-20 MED ORDER — HYDROCODONE-ACETAMINOPHEN 5-325 MG PO TABS: 1.0000 | ORAL_TABLET | Freq: Four times a day (QID) | ORAL | Status: DC | PRN

## 2014-06-20 NOTE — ED Notes (Signed)
Per [facility], pt lives at home aline, was walking, right knee gave out and she fell directly onto her bottom. Denies Hitting head, denies LOC, denies dizziness, denies blood thinners. Pt c/o pain to R knee and chronic lower back pain. Pt is AAOX4, in NAD. No obvious injury or deformity. Pain 10/10 on R knee.
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
## 2014-06-20 NOTE — Discharge Instructions (Signed)
Arthralgia Arthralgia is joint pain. A joint is a place where two bones meet. Joint pain can happen for many reasons. The joint can be bruised, stiff, infected, or weak from aging. Pain usually goes away after resting and taking medicine for soreness.  HOME CARE  Rest the joint as told by your doctor.  Keep the sore joint raised (elevated) for the first 24 hours.  Put ice on the joint area.  Put ice in a plastic bag.  Place a towel between your skin and the bag.  Leave the ice on for 15-20 minutes, 03-04 times a day.  Wear your splint, casting, elastic bandage, or sling as told by your doctor.  Only take medicine as told by your doctor. Do not take aspirin.  Use crutches as told by your doctor. Do not put weight on the joint until told to by your doctor. GET HELP RIGHT AWAY IF:   You have bruising, puffiness (swelling), or more pain.  Your fingers or toes turn blue or start to lose feeling (numb).  Your medicine does not lessen the pain.  Your pain becomes severe.  You have a temperature by mouth above 102 F (38.9 C), not controlled by medicine.  You cannot move or use the joint. MAKE SURE YOU:   Understand these instructions.  Will watch your condition.  Will get help right away if you are not doing well or get worse. Document Released: 01/05/2009 Document Revised: 04/11/2011 Document Reviewed: 01/05/2009 [facility] Patient Information 2015 [place], [place]. This information is not intended to replace advice given to you by your health care provider. Make sure you discuss any questions you have with your health care provider.  Arthritis, Nonspecific Arthritis is inflammation of a joint. This usually means pain, redness, warmth or swelling are present. One or more joints may be involved. There are a number of types of arthritis. Your caregiver may not be able to tell what type of arthritis you have right away. CAUSES  The most common cause of arthritis is the wear and  tear on the joint (osteoarthritis). This causes damage to the cartilage, which can break down over time. The knees, hips, back and neck are most often affected by this type of arthritis. Other types of arthritis and common causes of joint pain include:  Sprains and other injuries near the joint. Sometimes minor sprains and injuries cause pain and swelling that develop hours later.  Rheumatoid arthritis. This affects hands, feet and knees. It usually affects both sides of your body at the same time. It is often associated with chronic ailments, fever, weight loss and general weakness.  Crystal arthritis. Gout and pseudo gout can cause occasional acute severe pain, redness and swelling in the foot, ankle, or knee.  Infectious arthritis. Bacteria can get into a joint through a break in overlying skin. This can cause infection of the joint. Bacteria and viruses can also spread through the blood and affect your joints.  Drug, infectious and allergy reactions. Sometimes joints can become mildly painful and slightly swollen with these types of illnesses. SYMPTOMS   Pain is the main symptom.  Your joint or joints can also be red, swollen and warm or hot to the touch.  You may have a fever with certain types of arthritis, or even feel overall ill.  The joint with arthritis will hurt with movement. Stiffness is present with some types of arthritis. DIAGNOSIS  Your caregiver will suspect arthritis based on your description of your symptoms and on  your exam. Testing may be needed to find the type of arthritis:  Blood and sometimes urine tests.  X-ray tests and sometimes CT or MRI scans.  Removal of fluid from the joint (arthrocentesis) is done to check for bacteria, crystals or other causes. Your caregiver (or a specialist) will numb the area over the joint with a local anesthetic, and use a needle to remove joint fluid for examination. This procedure is only minimally uncomfortable.  Even with  these tests, your caregiver may not be able to tell what kind of arthritis you have. Consultation with a specialist (rheumatologist) may be helpful. TREATMENT  Your caregiver will discuss with you treatment specific to your type of arthritis. If the specific type cannot be determined, then the following general recommendations may apply. Treatment of severe joint pain includes:  Rest.  Elevation.  Anti-inflammatory medication (for example, ibuprofen) may be prescribed. Avoiding activities that cause increased pain.  Only take over-the-counter or prescription medicines for pain and discomfort as recommended by your caregiver.  Cold packs over an inflamed joint may be used for 10 to 15 minutes every hour. Hot packs sometimes feel better, but do not use overnight. Do not use hot packs if you are diabetic without your caregiver's permission.  A cortisone shot into arthritic joints may help reduce pain and swelling.  Any acute arthritis that gets worse over the next 1 to 2 days needs to be looked at to be sure there is no joint infection. Long-term arthritis treatment involves modifying activities and lifestyle to reduce joint stress jarring. This can include weight loss. Also, exercise is needed to nourish the joint cartilage and remove waste. This helps keep the muscles around the joint strong. HOME CARE INSTRUCTIONS   Do not take aspirin to relieve pain if gout is suspected. This elevates uric acid levels.  Only take over-the-counter or prescription medicines for pain, discomfort or fever as directed by your caregiver.  Rest the joint as much as possible.  If your joint is swollen, keep it elevated.  Use crutches if the painful joint is in your leg.  Drinking plenty of fluids may help for certain types of arthritis.  Follow your caregiver's dietary instructions.  Try low-impact exercise such as:  Swimming.  Water aerobics.  Biking.  Walking.  Morning stiffness is often  relieved by a warm shower.  Put your joints through regular range-of-motion. SEEK MEDICAL CARE IF:   You do not feel better in 24 hours or are getting worse.  You have side effects to medications, or are not getting better with treatment. SEEK IMMEDIATE MEDICAL CARE IF:   You have a fever.  You develop severe joint pain, swelling or redness.  Many joints are involved and become painful and swollen.  There is severe back pain and/or leg weakness.  You have loss of bowel or bladder control. Document Released: 02/25/2004 Document Revised: 04/11/2011 Document Reviewed: 03/12/2008 [facility] Patient Information 2015 [place], [place]. This information is not intended to replace advice given to you by your health care provider. Make sure you discuss any questions you have with your health care provider. Fall Prevention and Home Safety Falls cause injuries and can affect all age groups. It is possible to use preventive measures to significantly decrease the likelihood of falls. There are many simple measures which can make your home safer and prevent falls. OUTDOORS  Repair cracks and edges of walkways and driveways.  Remove high doorway thresholds.  Trim shrubbery on the main path into  your home.  Have good outside lighting.  Clear walkways of tools, rocks, debris, and clutter.  Check that handrails are not broken and are securely fastened. Both sides of steps should have handrails.  Have leaves, snow, and ice cleared regularly.  Use sand or salt on walkways during winter months.  In the garage, clean up grease or oil spills. BATHROOM  Install night lights.  Install grab bars by the toilet and in the tub and shower.  Use non-skid mats or decals in the tub or shower.  Place a plastic non-slip stool in the shower to sit on, if needed.  Keep floors dry and clean up all water on the floor immediately.  Remove soap buildup in the tub or shower on a regular basis.  Secure bath  mats with non-slip, double-sided rug tape.  Remove throw rugs and tripping hazards from the floors. BEDROOMS  Install night lights.  Make sure a bedside light is easy to reach.  Do not use oversized bedding.  Keep a telephone by your bedside.  Have a firm chair with side arms to use for getting dressed.  Remove throw rugs and tripping hazards from the floor. KITCHEN  Keep handles on pots and pans turned toward the center of the stove. Use back burners when possible.  Clean up spills quickly and allow time for drying.  Avoid walking on wet floors.  Avoid hot utensils and knives.  Position shelves so they are not too high or low.  Place commonly used objects within easy reach.  If necessary, use a sturdy step stool with a grab bar when reaching.  Keep electrical cables out of the way.  Do not use floor polish or wax that makes floors slippery. If you must use wax, use non-skid floor wax.  Remove throw rugs and tripping hazards from the floor. STAIRWAYS  Never leave objects on stairs.  Place handrails on both sides of stairways and use them. Fix any loose handrails. Make sure handrails on both sides of the stairways are as long as the stairs.  Check carpeting to make sure it is firmly attached along stairs. Make repairs to worn or loose carpet promptly.  Avoid placing throw rugs at the top or bottom of stairways, or properly secure the rug with carpet tape to prevent slippage. Get rid of throw rugs, if possible.  Have an electrician put in a light switch at the top and bottom of the stairs. OTHER FALL PREVENTION TIPS  Wear low-heel or rubber-soled shoes that are supportive and fit well. Wear closed toe shoes.  When using a stepladder, make sure it is fully opened and both spreaders are firmly locked. Do not climb a closed stepladder.  Add color or contrast paint or tape to grab bars and handrails in your home. Place contrasting color strips on first and last  steps.  Learn and use mobility aids as needed. Install an electrical emergency response system.  Turn on lights to avoid dark areas. Replace light bulbs that burn out immediately. Get light switches that glow.  Arrange furniture to create clear pathways. Keep furniture in the same place.  Firmly attach carpet with non-skid or double-sided tape.  Eliminate uneven floor surfaces.  Select a carpet pattern that does not visually hide the edge of steps.  Be aware of all pets. OTHER HOME SAFETY TIPS  Set the water temperature for 120 F (48.8 C).  Keep emergency numbers on or near the telephone.  Keep smoke detectors on every level  of the home and near sleeping areas. Document Released: 01/07/2002 Document Revised: 07/19/2011 Document Reviewed: 04/08/2011 [facility] Patient Information 2015 [place], [place]. This information is not intended to replace advice given to you by your health care provider. Make sure you discuss any questions you have with your health care provider.

## 2014-06-20 NOTE — ED Notes (Signed)
Attempted to ambulate pt. Pt uses a walker at home which she doesn't have with her. PA notified.

## 2014-06-20 NOTE — ED Notes (Addendum)
Pt was able to ambulate with walker, no hip/back/knee pain noted while walking, once in bed stated her pain became a 4/5 in her right knee. Slight dizziness after a short duration of walking but states its her normal. No sob/cp.

## 2014-06-24 DIAGNOSIS — I10 Essential (primary) hypertension: Secondary | ICD-10-CM | POA: Diagnosis not present

## 2014-06-24 DIAGNOSIS — I2510 Atherosclerotic heart disease of native coronary artery without angina pectoris: Secondary | ICD-10-CM | POA: Diagnosis not present

## 2014-06-24 DIAGNOSIS — M159 Polyosteoarthritis, unspecified: Secondary | ICD-10-CM | POA: Diagnosis not present

## 2014-06-24 DIAGNOSIS — S32010D Wedge compression fracture of first lumbar vertebra, subsequent encounter for fracture with routine healing: Secondary | ICD-10-CM | POA: Diagnosis not present

## 2014-06-24 DIAGNOSIS — M810 Age-related osteoporosis without current pathological fracture: Secondary | ICD-10-CM | POA: Diagnosis not present

## 2014-06-26 DIAGNOSIS — I10 Essential (primary) hypertension: Secondary | ICD-10-CM | POA: Diagnosis not present

## 2014-06-26 DIAGNOSIS — I2510 Atherosclerotic heart disease of native coronary artery without angina pectoris: Secondary | ICD-10-CM | POA: Diagnosis not present

## 2014-06-26 DIAGNOSIS — S32010D Wedge compression fracture of first lumbar vertebra, subsequent encounter for fracture with routine healing: Secondary | ICD-10-CM | POA: Diagnosis not present

## 2014-06-26 DIAGNOSIS — M810 Age-related osteoporosis without current pathological fracture: Secondary | ICD-10-CM | POA: Diagnosis not present

## 2014-06-26 DIAGNOSIS — M159 Polyosteoarthritis, unspecified: Secondary | ICD-10-CM | POA: Diagnosis not present

## 2014-06-26 DIAGNOSIS — S32000D Wedge compression fracture of unspecified lumbar vertebra, subsequent encounter for fracture with routine healing: Secondary | ICD-10-CM | POA: Diagnosis not present

## 2014-06-26 DIAGNOSIS — M25561 Pain in right knee: Secondary | ICD-10-CM | POA: Diagnosis not present

## 2014-07-01 DIAGNOSIS — M1990 Unspecified osteoarthritis, unspecified site: Secondary | ICD-10-CM | POA: Diagnosis not present

## 2014-07-01 DIAGNOSIS — W19XXXD Unspecified fall, subsequent encounter: Secondary | ICD-10-CM | POA: Diagnosis not present

## 2014-07-01 DIAGNOSIS — M2550 Pain in unspecified joint: Secondary | ICD-10-CM | POA: Diagnosis not present

## 2014-07-01 DIAGNOSIS — M0609 Rheumatoid arthritis without rheumatoid factor, multiple sites: Secondary | ICD-10-CM | POA: Diagnosis not present

## 2014-07-08 DIAGNOSIS — M25562 Pain in left knee: Secondary | ICD-10-CM | POA: Diagnosis not present

## 2014-07-08 DIAGNOSIS — Z96651 Presence of right artificial knee joint: Secondary | ICD-10-CM | POA: Diagnosis not present

## 2014-07-08 DIAGNOSIS — M25561 Pain in right knee: Secondary | ICD-10-CM | POA: Diagnosis not present

## 2014-07-23 DIAGNOSIS — M79676 Pain in unspecified toe(s): Secondary | ICD-10-CM | POA: Diagnosis not present

## 2014-08-06 DIAGNOSIS — H01001 Unspecified blepharitis right upper eyelid: Secondary | ICD-10-CM | POA: Diagnosis not present

## 2014-08-06 DIAGNOSIS — H04123 Dry eye syndrome of bilateral lacrimal glands: Secondary | ICD-10-CM | POA: Diagnosis not present

## 2014-08-06 DIAGNOSIS — Z0100 Encounter for examination of eyes and vision without abnormal findings: Secondary | ICD-10-CM | POA: Diagnosis not present

## 2014-08-06 DIAGNOSIS — H01004 Unspecified blepharitis left upper eyelid: Secondary | ICD-10-CM | POA: Diagnosis not present

## 2014-08-26 ENCOUNTER — Encounter: Payer: Self-pay | Admitting: Podiatry

## 2014-08-26 ENCOUNTER — Ambulatory Visit (INDEPENDENT_AMBULATORY_CARE_PROVIDER_SITE_OTHER): Payer: Medicare Other | Admitting: Podiatry

## 2014-08-26 VITALS — BP 141/69 | HR 71 | Temp 98.1°F | Resp 14 | Ht <= 58 in | Wt 180.0 lb

## 2014-08-26 DIAGNOSIS — L84 Corns and callosities: Secondary | ICD-10-CM

## 2014-08-26 DIAGNOSIS — M2042 Other hammer toe(s) (acquired), left foot: Secondary | ICD-10-CM

## 2014-08-26 NOTE — Progress Notes (Signed)
   Subjective:    Patient ID: Tracy Sharp, female    DOB: 04/08/1924, 81 y.o.   MRN: [id number]  HPI Patient presents here today with left 2nd toe is painful and sore to touch"  This patient presents today with her daughter-in-law was present in the treatment room. Patient is complaining of approximately 7 month history of pain on the distal aspect of the second left toe aggravated with standing and walking. She has attempted to soak it, as well as scrape it and apply topical antibiotic ointment, however, the symptoms have persisted. In addition her daughter lives cut out the top of the shoe over the second left toe.  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  HENT: Positive for hearing loss and trouble swallowing.   Eyes: Positive for itching.  Respiratory: Positive for shortness of breath.   Cardiovascular: Positive for chest pain and leg swelling.  Gastrointestinal: Positive for diarrhea and constipation.  Endocrine: Positive for cold intolerance and heat intolerance.       Excessive thirst  Genitourinary: Positive for urgency and frequency.  Musculoskeletal: Positive for back pain and gait problem.       Joint pain, muscle pain  Allergic/Immunologic: Positive for environmental allergies.  Neurological: Positive for dizziness, weakness, light-headedness, numbness and headaches.  Hematological: Bruises/bleeds easily.       Slow to heal  Psychiatric/Behavioral: Positive for hallucinations and confusion. The patient is nervous/anxious.        Objective:   Physical Exam  Pleasant orientated 3 patient presents with her daughter in the treatment room  Vascular: No peripheral edema noted bilaterally Varicosities ankles and feet bilaterally DP and PT pulses 2/4 bilaterally Capillary reflex immediate  bilaterally  Neurological: Sensation to 10 g monofilament wire intact 1/5 right and 0/5 left Vibratory sensation nonreactive right and reactive left Ankle reflexes reactive bilaterally  Dermatological: The distal second left toe has hyperkeratotic tissue with some surrounding scaling. There is no erythema, edema, warmth in the area Keratoses fifth right toe The toenails are hypertrophic, discolored, and neatly trim 6-10  Musculoskeletal: Patient walks slowly with a roller walker Keratoses fifth right toe The third left toe was amputated The distal second left toe has a well-organized keratoses surrounded with some scaling that remains closed after debridemen The second left toe is relatively long and mildly contracted at the PIPJ which is rigid Toes 2-5 are rigid at the PIPJ bilaterally The second left toe is relatively long         Assessment & Plan:  Assessment: Satisfactory vascular status Peripheral neuropathy Rigid hammertoe second left, relatively long with associated distal keratoses  Plan: I reviewed the results of the examination with patient and daughter-in-law today The distal keratoses was debrided and a gelcap toe was dispensed to apply over the second left toe I advised the daughter-in-law to cut out the distal aspect of the second left toe completely if the symptoms persist. Also, I suggested the possibility of a sandal. I made aware this lesion would be chronic and return as needed

## 2014-08-26 NOTE — Patient Instructions (Signed)
Wear the gelcap toe cover on the second left toe on a daily basis If the pain persists on the end of the second left toe cut the remaining portion of the shoe out over the second left toe

## 2014-09-03 DIAGNOSIS — M2550 Pain in unspecified joint: Secondary | ICD-10-CM | POA: Diagnosis not present

## 2014-09-03 DIAGNOSIS — M0609 Rheumatoid arthritis without rheumatoid factor, multiple sites: Secondary | ICD-10-CM | POA: Diagnosis not present

## 2014-09-03 DIAGNOSIS — M150 Primary generalized (osteo)arthritis: Secondary | ICD-10-CM | POA: Diagnosis not present

## 2014-09-03 DIAGNOSIS — R5383 Other fatigue: Secondary | ICD-10-CM | POA: Diagnosis not present

## 2014-09-16 ENCOUNTER — Emergency Department (HOSPITAL_COMMUNITY): Payer: Medicare Other

## 2014-09-16 ENCOUNTER — Emergency Department (HOSPITAL_BASED_OUTPATIENT_CLINIC_OR_DEPARTMENT_OTHER)
Admit: 2014-09-16 | Discharge: 2014-09-16 | Disposition: A | Discharge status: Home / Self Care | Payer: Medicare Other | Attending: Emergency Medicine | Admitting: Emergency Medicine

## 2014-09-16 ENCOUNTER — Observation Stay (HOSPITAL_COMMUNITY)
Admission: EM | Admit: 2014-09-16 | Discharge: 2014-09-17 | Disposition: A | Discharge status: Home / Self Care | Payer: Medicare Other | Attending: Internal Medicine | Admitting: Internal Medicine

## 2014-09-16 ENCOUNTER — Encounter (HOSPITAL_COMMUNITY): Payer: Self-pay | Admitting: *Deleted

## 2014-09-16 ENCOUNTER — Observation Stay (HOSPITAL_COMMUNITY): Payer: Medicare Other

## 2014-09-16 DIAGNOSIS — G8929 Other chronic pain: Secondary | ICD-10-CM | POA: Diagnosis not present

## 2014-09-16 DIAGNOSIS — K219 Gastro-esophageal reflux disease without esophagitis: Secondary | ICD-10-CM | POA: Diagnosis not present

## 2014-09-16 DIAGNOSIS — M79602 Pain in left arm: Secondary | ICD-10-CM | POA: Diagnosis not present

## 2014-09-16 DIAGNOSIS — Z89422 Acquired absence of other left toe(s): Secondary | ICD-10-CM | POA: Diagnosis not present

## 2014-09-16 DIAGNOSIS — M6281 Muscle weakness (generalized): Secondary | ICD-10-CM | POA: Diagnosis not present

## 2014-09-16 DIAGNOSIS — N3946 Mixed incontinence: Secondary | ICD-10-CM | POA: Insufficient documentation

## 2014-09-16 DIAGNOSIS — I2510 Atherosclerotic heart disease of native coronary artery without angina pectoris: Secondary | ICD-10-CM | POA: Insufficient documentation

## 2014-09-16 DIAGNOSIS — K589 Irritable bowel syndrome without diarrhea: Secondary | ICD-10-CM | POA: Insufficient documentation

## 2014-09-16 DIAGNOSIS — F39 Unspecified mood [affective] disorder: Secondary | ICD-10-CM | POA: Diagnosis not present

## 2014-09-16 DIAGNOSIS — Z96653 Presence of artificial knee joint, bilateral: Secondary | ICD-10-CM | POA: Insufficient documentation

## 2014-09-16 DIAGNOSIS — M79605 Pain in left leg: Secondary | ICD-10-CM | POA: Diagnosis not present

## 2014-09-16 DIAGNOSIS — K5900 Constipation, unspecified: Secondary | ICD-10-CM | POA: Insufficient documentation

## 2014-09-16 DIAGNOSIS — H9193 Unspecified hearing loss, bilateral: Secondary | ICD-10-CM | POA: Diagnosis not present

## 2014-09-16 DIAGNOSIS — R531 Weakness: Secondary | ICD-10-CM | POA: Insufficient documentation

## 2014-09-16 DIAGNOSIS — R600 Localized edema: Secondary | ICD-10-CM | POA: Diagnosis not present

## 2014-09-16 DIAGNOSIS — R296 Repeated falls: Secondary | ICD-10-CM

## 2014-09-16 DIAGNOSIS — Z79899 Other long term (current) drug therapy: Secondary | ICD-10-CM | POA: Diagnosis not present

## 2014-09-16 DIAGNOSIS — Z7982 Long term (current) use of aspirin: Secondary | ICD-10-CM | POA: Diagnosis not present

## 2014-09-16 DIAGNOSIS — M1380 Other specified arthritis, unspecified site: Secondary | ICD-10-CM

## 2014-09-16 DIAGNOSIS — I10 Essential (primary) hypertension: Secondary | ICD-10-CM | POA: Diagnosis present

## 2014-09-16 DIAGNOSIS — R1084 Generalized abdominal pain: Secondary | ICD-10-CM | POA: Diagnosis not present

## 2014-09-16 DIAGNOSIS — M542 Cervicalgia: Secondary | ICD-10-CM | POA: Insufficient documentation

## 2014-09-16 DIAGNOSIS — E785 Hyperlipidemia, unspecified: Secondary | ICD-10-CM | POA: Insufficient documentation

## 2014-09-16 DIAGNOSIS — R29898 Other symptoms and signs involving the musculoskeletal system: Secondary | ICD-10-CM

## 2014-09-16 DIAGNOSIS — R0602 Shortness of breath: Secondary | ICD-10-CM | POA: Insufficient documentation

## 2014-09-16 DIAGNOSIS — M79606 Pain in leg, unspecified: Secondary | ICD-10-CM

## 2014-09-16 DIAGNOSIS — M1990 Unspecified osteoarthritis, unspecified site: Secondary | ICD-10-CM | POA: Diagnosis present

## 2014-09-16 DIAGNOSIS — M159 Polyosteoarthritis, unspecified: Secondary | ICD-10-CM

## 2014-09-16 DIAGNOSIS — F329 Major depressive disorder, single episode, unspecified: Secondary | ICD-10-CM | POA: Diagnosis not present

## 2014-09-16 DIAGNOSIS — W19XXXA Unspecified fall, initial encounter: Secondary | ICD-10-CM

## 2014-09-16 DIAGNOSIS — S3992XA Unspecified injury of lower back, initial encounter: Secondary | ICD-10-CM | POA: Diagnosis not present

## 2014-09-16 DIAGNOSIS — R269 Unspecified abnormalities of gait and mobility: Secondary | ICD-10-CM | POA: Diagnosis not present

## 2014-09-16 DIAGNOSIS — R609 Edema, unspecified: Secondary | ICD-10-CM | POA: Diagnosis not present

## 2014-09-16 DIAGNOSIS — M79604 Pain in right leg: Secondary | ICD-10-CM | POA: Diagnosis not present

## 2014-09-16 DIAGNOSIS — R109 Unspecified abdominal pain: Secondary | ICD-10-CM | POA: Diagnosis not present

## 2014-09-16 LAB — BASIC METABOLIC PANEL
Anion gap: 6 (ref 5–15)
BUN: 22 mg/dL — AB (ref 6–20)
CO2: 29 mmol/L (ref 22–32)
Calcium: 9.4 mg/dL (ref 8.9–10.3)
Chloride: 104 mmol/L (ref 101–111)
Creatinine, Ser: 0.57 mg/dL (ref 0.44–1.00)
GFR calc non Af Amer: >60 mL/min (ref >60)
Glucose, Bld: 114 mg/dL — ABNORMAL HIGH (ref 65–99)
Potassium: 4.6 mmol/L (ref 3.5–5.1)
SODIUM: 139 mmol/L (ref 135–145)

## 2014-09-16 LAB — CBC WITH DIFFERENTIAL/PLATELET
Basophils Absolute: 0 10*3/uL (ref 0.0–0.1)
Basophils Relative: 0 % (ref 0–1)
Eosinophils Absolute: 0.1 10*3/uL (ref 0.0–0.7)
Eosinophils Relative: 1 % (ref 0–5)
HCT: 38.9 % (ref 36.0–46.0)
HEMOGLOBIN: 12.3 g/dL (ref 12.0–15.0)
LYMPHS PCT: 14 % (ref 12–46)
Lymphs Abs: 1.1 10*3/uL (ref 0.7–4.0)
MCH: 29.4 pg (ref 26.0–34.0)
MCHC: 31.6 g/dL (ref 30.0–36.0)
MCV: 93.1 fL (ref 78.0–100.0)
Monocytes Absolute: 1 10*3/uL (ref 0.1–1.0)
Monocytes Relative: 14 % — ABNORMAL HIGH (ref 3–12)
Neutro Abs: 5.4 10*3/uL (ref 1.7–7.7)
Neutrophils Relative %: 71 % (ref 43–77)
PLATELETS: 176 10*3/uL (ref 150–400)
RBC: 4.18 MIL/uL (ref 3.87–5.11)
RDW: 12.8 % (ref 11.5–15.5)
WBC: 7.5 10*3/uL (ref 4.0–10.5)

## 2014-09-16 LAB — PROTIME-INR
INR: 1.05 (ref 0.00–1.49)
Prothrombin Time: 13.9 seconds (ref 11.6–15.2)

## 2014-09-16 MED ORDER — ENOXAPARIN SODIUM 40 MG/0.4ML ~~LOC~~ SOLN
40.0000 mg | SUBCUTANEOUS | Status: DC
  Administered 2014-09-17: 40 mg via SUBCUTANEOUS
  Filled 2014-09-16 (×2): qty 0.4

## 2014-09-16 MED ORDER — DOCUSATE SODIUM 100 MG PO CAPS
100.0000 mg | ORAL_CAPSULE | Freq: Every day | ORAL | Status: DC
  Administered 2014-09-17 (×2): 100 mg via ORAL
  Filled 2014-09-16 (×3): qty 1

## 2014-09-16 MED ORDER — MIRABEGRON ER 50 MG PO TB24
50.0000 mg | ORAL_TABLET | Freq: Every day | ORAL | Status: DC
  Administered 2014-09-17: 50 mg via ORAL
  Filled 2014-09-16 (×2): qty 1

## 2014-09-16 MED ORDER — DOCUSATE SODIUM 100 MG PO CAPS: 100.0000 mg | ORAL_CAPSULE | ORAL | Status: DC

## 2014-09-16 MED ORDER — SODIUM CHLORIDE 0.9 % IJ SOLN
3.0000 mL | Freq: Two times a day (BID) | INTRAMUSCULAR | Status: DC
  Administered 2014-09-17: 3 mL via INTRAVENOUS

## 2014-09-16 MED ORDER — PANTOPRAZOLE SODIUM 40 MG PO TBEC
40.0000 mg | DELAYED_RELEASE_TABLET | Freq: Every day | ORAL | Status: DC
  Administered 2014-09-17: 40 mg via ORAL
  Filled 2014-09-16: qty 1

## 2014-09-16 MED ORDER — ALBUTEROL SULFATE (2.5 MG/3ML) 0.083% IN NEBU: 2.5000 mg | INHALATION_SOLUTION | RESPIRATORY_TRACT | Status: DC | PRN

## 2014-09-16 MED ORDER — ASPIRIN EC 81 MG PO TBEC
81.0000 mg | DELAYED_RELEASE_TABLET | Freq: Every day | ORAL | Status: DC
  Administered 2014-09-17: 81 mg via ORAL
  Filled 2014-09-16: qty 1

## 2014-09-16 MED ORDER — ONDANSETRON HCL 4 MG/2ML IJ SOLN
4.0000 mg | Freq: Four times a day (QID) | INTRAMUSCULAR | Status: DC | PRN
Start: 2014-09-16 — End: 2014-09-17

## 2014-09-16 MED ORDER — MELATONIN 5 MG PO TABS: 7.5000 mg | ORAL_TABLET | Freq: Every day | ORAL | Status: DC

## 2014-09-16 MED ORDER — AMITRIPTYLINE HCL 10 MG PO TABS
10.0000 mg | ORAL_TABLET | Freq: Every day | ORAL | Status: DC
  Administered 2014-09-17: 10 mg via ORAL
  Filled 2014-09-16 (×2): qty 1

## 2014-09-16 MED ORDER — EYE LUBRICANT OP OINT: TOPICAL_OINTMENT | Freq: Every day | OPHTHALMIC | Status: DC

## 2014-09-16 MED ORDER — ARTIFICIAL TEARS OP OINT: TOPICAL_OINTMENT | Freq: Every evening | OPHTHALMIC | Status: DC | PRN

## 2014-09-16 MED ORDER — DULOXETINE HCL 60 MG PO CPEP
60.0000 mg | ORAL_CAPSULE | Freq: Every day | ORAL | Status: DC
  Administered 2014-09-17: 60 mg via ORAL
  Filled 2014-09-16: qty 1

## 2014-09-16 MED ORDER — PREDNISONE 20 MG PO TABS
20.0000 mg | ORAL_TABLET | Freq: Every day | ORAL | Status: DC
  Administered 2014-09-17: 20 mg via ORAL
  Filled 2014-09-16 (×2): qty 1

## 2014-09-16 MED ORDER — ACETAMINOPHEN 650 MG RE SUPP: 650.0000 mg | Freq: Four times a day (QID) | RECTAL | Status: DC | PRN

## 2014-09-16 MED ORDER — ACETAMINOPHEN 325 MG PO TABS: 650.0000 mg | ORAL_TABLET | Freq: Four times a day (QID) | ORAL | Status: DC | PRN

## 2014-09-16 MED ORDER — ONDANSETRON HCL 4 MG PO TABS: 4.0000 mg | ORAL_TABLET | Freq: Four times a day (QID) | ORAL | Status: DC | PRN

## 2014-09-16 MED ORDER — FENTANYL CITRATE (PF) 100 MCG/2ML IJ SOLN
100.0000 ug | Freq: Once | INTRAMUSCULAR | Status: AC
  Administered 2014-09-16: 100 ug via INTRAVENOUS
  Filled 2014-09-16: qty 2

## 2014-09-16 MED ORDER — ACETAMINOPHEN 325 MG PO TABS
650.0000 mg | ORAL_TABLET | Freq: Once | ORAL | Status: AC
  Administered 2014-09-16: 650 mg via ORAL
  Filled 2014-09-16: qty 2

## 2014-09-16 MED ORDER — FOLIC ACID 1 MG PO TABS
1.0000 mg | ORAL_TABLET | Freq: Every day | ORAL | Status: DC
  Administered 2014-09-17: 1 mg via ORAL
  Filled 2014-09-16: qty 1

## 2014-09-16 MED ORDER — HYDROCODONE-ACETAMINOPHEN 5-325 MG PO TABS
1.0000 | ORAL_TABLET | ORAL | Status: DC | PRN
  Administered 2014-09-17 (×2): 2 via ORAL
  Filled 2014-09-16 (×2): qty 2

## 2014-09-16 NOTE — ED Notes (Signed)
Pt here with c/o bilateral leg pain that started last night while watching TV.

## 2014-09-16 NOTE — Progress Notes (Signed)
*  PRELIMINARY RESULTS* Vascular Ultrasound Left lower extremity venous duplex has been completed.  Preliminary findings: negative for DVT  Landry Mellow, RDMS, RVT  09/16/2014, 3:59 PM

## 2014-09-16 NOTE — ED Notes (Signed)
Pt assisted to BSC and back to bed 
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
## 2014-09-16 NOTE — ED Notes (Addendum)
Pt and pt's family refused to allow staff to try to assist pt to ambulate. Per shift report, but can pivot on her left leg to get to the bedside commode, but can do no more than that due to pain. Pt is requesting more pain medicine at this time.  Despite this, pulses still palpable in both pt's lower extremities.

## 2014-09-16 NOTE — ED Notes (Signed)
Bed: MI19 Expected date:  Expected time:  Means of arrival:  Comments: EMS- 81yo F, bilateral leg pain
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
## 2014-09-17 ENCOUNTER — Encounter: Payer: Self-pay | Admitting: *Deleted

## 2014-09-17 DIAGNOSIS — M79605 Pain in left leg: Secondary | ICD-10-CM | POA: Diagnosis not present

## 2014-09-17 DIAGNOSIS — M1380 Other specified arthritis, unspecified site: Secondary | ICD-10-CM | POA: Diagnosis present

## 2014-09-17 DIAGNOSIS — K219 Gastro-esophageal reflux disease without esophagitis: Secondary | ICD-10-CM | POA: Diagnosis not present

## 2014-09-17 DIAGNOSIS — R531 Weakness: Secondary | ICD-10-CM

## 2014-09-17 DIAGNOSIS — R296 Repeated falls: Secondary | ICD-10-CM | POA: Diagnosis not present

## 2014-09-17 DIAGNOSIS — R109 Unspecified abdominal pain: Secondary | ICD-10-CM

## 2014-09-17 LAB — LACTIC ACID, PLASMA: LACTIC ACID, VENOUS: 0.7 mmol/L (ref 0.5–2.0)

## 2014-09-17 LAB — COMPREHENSIVE METABOLIC PANEL
ALBUMIN: 3.4 g/dL — AB (ref 3.5–5.0)
ALT: 15 U/L (ref 14–54)
ANION GAP: 7 (ref 5–15)
AST: 16 U/L (ref 15–41)
Alkaline Phosphatase: 69 U/L (ref 38–126)
BILIRUBIN TOTAL: 1.2 mg/dL (ref 0.3–1.2)
BUN: 15 mg/dL (ref 6–20)
CHLORIDE: 104 mmol/L (ref 101–111)
CO2: 26 mmol/L (ref 22–32)
Calcium: 9.1 mg/dL (ref 8.9–10.3)
Creatinine, Ser: 0.57 mg/dL (ref 0.44–1.00)
GFR calc Af Amer: >60 mL/min (ref >60)
GLUCOSE: 136 mg/dL — AB (ref 65–99)
POTASSIUM: 4 mmol/L (ref 3.5–5.1)
Sodium: 137 mmol/L (ref 135–145)
TOTAL PROTEIN: 6.4 g/dL — AB (ref 6.5–8.1)

## 2014-09-17 LAB — CBC WITH DIFFERENTIAL/PLATELET
BASOS ABS: 0 10*3/uL (ref 0.0–0.1)
BASOS PCT: 0 % (ref 0–1)
EOS PCT: 0 % (ref 0–5)
Eosinophils Absolute: 0 10*3/uL (ref 0.0–0.7)
HCT: 38.1 % (ref 36.0–46.0)
Hemoglobin: 12.5 g/dL (ref 12.0–15.0)
Lymphocytes Relative: 8 % — ABNORMAL LOW (ref 12–46)
Lymphs Abs: 0.7 10*3/uL (ref 0.7–4.0)
MCH: 30.5 pg (ref 26.0–34.0)
MCHC: 32.8 g/dL (ref 30.0–36.0)
MCV: 92.9 fL (ref 78.0–100.0)
MONO ABS: 0.4 10*3/uL (ref 0.1–1.0)
MONOS PCT: 5 % (ref 3–12)
Neutro Abs: 7.4 10*3/uL (ref 1.7–7.7)
Neutrophils Relative %: 87 % — ABNORMAL HIGH (ref 43–77)
PLATELETS: 207 10*3/uL (ref 150–400)
RBC: 4.1 MIL/uL (ref 3.87–5.11)
RDW: 12.9 % (ref 11.5–15.5)
WBC: 8.5 10*3/uL (ref 4.0–10.5)

## 2014-09-17 LAB — LIPASE, BLOOD: LIPASE: 14 U/L — AB (ref 22–51)

## 2014-09-17 LAB — BRAIN NATRIURETIC PEPTIDE: B NATRIURETIC PEPTIDE 5: 79.2 pg/mL (ref 0.0–100.0)

## 2014-09-17 LAB — FOLATE: FOLATE: 20.1 ng/mL (ref >5.9)

## 2014-09-17 LAB — VITAMIN B12: VITAMIN B 12: 288 pg/mL (ref 180–914)

## 2014-09-17 NOTE — Evaluation (Signed)
Physical Therapy Evaluation Patient Details Name: Tracy Sharp MRN: [id number] DOB: [birth year] Today's Date: 09/17/2014   History of Present Illness  81 y.o. female with Past medical history of osteoarthritis, coronary artery disease, hypertension, gait disturbances, recurrent fall, GERD and presented to ED after fall at home with chronic leg pain.  Clinical Impression  Pt admitted with above diagnosis. Pt currently with functional limitations due to the deficits listed below (see PT Problem List).  Pt will benefit from skilled PT to increase their independence and safety with mobility to allow discharge to the venue listed below.   Pt currently requiring assist out of bed and for transfers however mobility improved with ambulation.  Due to hx of falls, recommend initial assist available at home upon d/c.  Pt states her daughter checks in on her every day however she lives alone.     Follow Up Recommendations Home health PT;Supervision/Assistance - 24 hour    Equipment Recommendations  None recommended by PT    Recommendations for Other Services       Precautions / Restrictions Precautions Precautions: Fall Restrictions Weight Bearing Restrictions: No      Mobility  Bed Mobility Overal bed mobility: Needs Assistance Bed Mobility: Supine to Sit     Supine to sit: Min assist     General bed mobility comments: assist for trunk upright  Transfers Overall transfer level: Needs assistance Equipment used: Rolling walker (2 wheeled) Transfers: Sit to/from Stand Sit to Stand: Mod assist;+2 safety/equipment         General transfer comment: assist to rise and steady, posterior lean against bed observed however able to correct with cues, improved transfer with sitting  Ambulation/Gait Ambulation/Gait assistance: +2 safety/equipment;Min assist;Min guard Ambulation Distance (Feet): 120 Feet Assistive device: Rolling walker (2 wheeled) Gait Pattern/deviations: Step-through  pattern;Decreased stride length;Trunk flexed     General Gait Details: initial assist however improved to min/guard, verbal cues for RW positioning and posture, slow pace  Stairs            Wheelchair Mobility    Modified Rankin (Stroke Patients Only)       Balance                                             Pertinent Vitals/Pain Pain Assessment: No/denies pain    Home Living Family/patient expects to be discharged to:: Private residence Living Arrangements: Alone Available Help at Discharge: Family Type of Home: House       Home Layout: One level Home Equipment: Environmental consultant - 2 wheels;Shower seat      Prior Function Level of Independence: Independent with assistive device(s);Needs assistance   Gait / Transfers Assistance Needed: mod I with RW  ADL's / Homemaking Assistance Needed: states daughter helps her into shower and she sits and bathes        Hand Dominance        Extremity/Trunk Assessment               Lower Extremity Assessment: Generalized weakness;LLE deficits/detail   LLE Deficits / Details: per chart review R weaker then L however pt with difficulty with AROM L LE, decreased AROM DF/PF, and unable to hold SLS on L LE     Communication   Communication: No difficulties  Cognition Arousal/Alertness: Awake/alert Behavior During Therapy: WFL for tasks assessed/performed Overall Cognitive Status: Within Functional Limits for tasks  assessed                      General Comments      Exercises        Assessment/Plan    PT Assessment Patient needs continued PT services  PT Diagnosis Difficulty walking;Generalized weakness   PT Problem List Decreased mobility;Decreased strength;Decreased activity tolerance  PT Treatment Interventions Gait training;DME instruction;Patient/family education;Functional mobility training;Therapeutic activities;Therapeutic exercise;Balance training   PT Goals (Current goals can  be found in the Care Plan section) Acute Rehab PT Goals PT Goal Formulation: With patient Time For Goal Achievement: 09/24/14 Potential to Achieve Goals: Good    Frequency Min 3X/week   Barriers to discharge        Co-evaluation               End of Session Equipment Utilized During Treatment: Gait belt Activity Tolerance: Patient tolerated treatment well Patient left: in chair;with call bell/phone within reach Nurse Communication: Mobility status    Functional Assessment Tool Used: clinical judgement Functional Limitation: Mobility: Walking and moving around Mobility: Walking and Moving Around Current Status (L9532): At least 40 percent but less than 60 percent impaired, limited or restricted Mobility: Walking and Moving Around Goal Status [phone]): At least 1 percent but less than 20 percent impaired, limited or restricted    Time: 3568-6168 PT Time Calculation (min) (ACUTE ONLY): 22 min   Charges:   PT Evaluation $Initial PT Evaluation Tier I: 1 Procedure     PT G Codes:   PT G-Codes **NOT FOR INPATIENT CLASS** Functional Assessment Tool Used: clinical judgement Functional Limitation: Mobility: Walking and moving around Mobility: Walking and Moving Around Current Status (H7290): At least 40 percent but less than 60 percent impaired, limited or restricted Mobility: Walking and Moving Around Goal Status [phone]): At least 1 percent but less than 20 percent impaired, limited or restricted    Geza Beranek,KATHrine E 09/17/2014, 12:45 PM Carmelia Bake, PT, DPT 09/17/2014 Pager: [phone]

## 2014-09-17 NOTE — Patient Outreach (Signed)
[name]) Care Management  09/17/2014  Tracy Sharp [birth year] [id number]   Referral from [facility], RN, assigned Kathie Rhodes, RN and [employer], [place].  Thanks, Ronnell Freshwater. [place], [place] Assistant Phone: [phone] Fax: [phone]

## 2014-09-17 NOTE — Progress Notes (Signed)
Patient discharged to home with daughter-in-law.  No complaints.  Vital signs stable.

## 2014-09-17 NOTE — Care Management Note (Signed)
Case Management Note  Patient Details  Name: Tracy Sharp MRN: [id number] Date of Birth: 07-04-1924  Subjective/Objective:          Admitted with LE weakness, unable to ambulate            Action/Plan: Discharge planning, spoke with patient at bedside. Patient states she lives at home alone, she has family who assist with ADL's as well as a housekeeper monthly that assists with meals and bathing. Patient needs assistance with transportation, family and friends help when able. Patient states she has multiple falls recently and has been evaluated by PCP with no significant findings. Patient uses a walker to ambulate. Patient has no interest in SNF, would be agreeable to [facility] if indicated. Awaiting PT evals.  Expected Discharge Date:                  Expected Discharge Plan:  [place]  In-House Referral:  NA  Discharge planning Services  CM Consult  Post Acute Care Choice:  NA Choice offered to:  NA  DME Arranged:  N/A DME Agency:  NA  HH Arranged:    [place] Agency:     Status of Service:  In process, will continue to follow  Medicare Important Message Given:    Date Medicare IM Given:    Medicare IM give by:    Date Additional Medicare IM Given:    Additional Medicare Important Message give by:     If discussed at [place] of Stay Meetings, dates discussed:    Additional Comments:  Guadalupe Maple, RN 09/17/2014, 11:12 AM

## 2014-09-17 NOTE — Progress Notes (Addendum)
Patient ID: Tracy Sharp, female   DOB: May 06, 1924, 80 y.o.   MRN: [id number] [facility] PROGRESS NOTE  BRENDAN GRUWELL YKD:[id number] DOB: 1924/03/11 DOA: 09/16/2014 PCP: Vidal Schwalbe, MD  Brief narrative:    81 y.o. female with past medical history of osteoarthritis, coronary artery disease, hypertension, recurrent fall, gait imbalance, memory loss who presented to [facility] ED with difficulty walking. At baseline she walks with the walker. She feels week and per her family she has needed assistance more frequently in past few weeks or so. She feels unsteady when she is up and her family is concerned that she is not safe at home especially since she lives alone. On admission, she was stable hemodynamically. No acute fractures identified on imaging studies on admission.  Anticipated discharge: 8/18 after PT evaluation  Assessment/Plan:    Principal Problem: Recurrent falls / Weakness / Gait imbalance - No acute fractures identified on X rays and MRI lumbar spine  - Per PT evaluation - recommendation for HHPT and supervision for 24 hours - Will get PT eval again tomorrow   Active Problems: GERD - Continue protonix   Depression - Continue Cymbalta and Elavil    DVT Prophylaxis  - Lovenox subQ ordered    Code Status: DNR/DNI Family Communication:  plan of care discussed with the patient and her daughter and daughter in law 09/17/2014 over the phone  Disposition Plan: Home likely 8/18  IV access:  Peripheral IV  Procedures and diagnostic studies:    Dg Cervical Spine 2 Or 3 Views 09/16/2014 No evidence of fracture or subluxation along the cervical spine. Chronic osseous fusion at C5-C6.   Electronically Signed   By: Garald Balding M.D.   On: 09/16/2014 23:39   Dg Thoracic Spine 2 View 09/16/2014  1. No evidence of fracture or subluxation along the thoracic spine. 2. Scattered vascular calcifications seen.   Electronically Signed   By: Garald Balding M.D.   On: 09/16/2014 23:33    Ct Head Wo Contrast 09/16/2014  No intracranial injury or fracture.   Electronically Signed   By: Monte Fantasia M.D.   On: 09/16/2014 23:22   Mr Lumbar Spine Wo Contrast 09/16/2014   1. Largely stable lumbar spine since April. No acute osseous abnormality identified. Chronic grade 1 anterolisthesis at L4-L5 and mild L1 compression fracture. 2. No acute right-sided neural impingement identified. There is up to moderate right lateral recess stenosis at L5-S1 and moderate bilateral foraminal stenosis elsewhere. There is stable mild multifactorial spinal stenosis at L1-L2. 3. There is a small left side synovial cyst now all at L4-L5 which could be a source of left L4 radiculitis.   Electronically Signed   By: Genevie Ann M.D.   On: 09/16/2014 19:18   Dg Abd Acute W/chest 09/16/2014 1. Grossly unremarkable bowel gas pattern. No free intra-abdominal air seen. 2. Mild cardiomegaly.  Lungs remain grossly clear. 3. Diffuse vascular calcifications seen.   Electronically Signed   By: Garald Balding M.D.   On: 09/16/2014 23:31    Medical Consultants:  None   Other Consultants:  PT  IAnti-Infectives:   None    Leisa Lenz, MD  [facility] Pager [phone]  Time spent in minutes: 15 minutes  If 7PM-7AM, please contact night-coverage [email or web address] Password [facility] 09/17/2014, 2:38 PM      HPI/Subjective: No acute overnight events. Patient reports feeling week.  Objective: Filed Vitals:   09/16/14 2200 09/17/14 0121 09/17/14 0528 09/17/14 1418  BP: 133/47 137/58 124/43  127/50  Pulse: 80 81 83 80  Temp:  98.1 F (36.7 C) 97.5 F (36.4 C) 97.9 F (36.6 C)  TempSrc:  Oral Oral Oral  Resp: 23 19 19 18   Height:  5\' 6"  (1.676 m)    Weight:  80.2 kg (176 lb 12.9 oz) 80.2 kg (176 lb 12.9 oz)   SpO2: 97% 98% 94% 100%    Intake/Output Summary (Last 24 hours) at 09/17/14 1438 Last data filed at 09/17/14 1300  Gross per 24 hour  Intake    483 ml  Output    850 ml  Net   -367 ml     Exam:   General:  Pt is alert, follows commands appropriately, not in acute distress  Cardiovascular: Regular rate and rhythm, S1/S2, no murmurs  Respiratory: Clear to auscultation bilaterally, no wheezing, no crackles, no rhonchi  Abdomen: Soft, non tender, non distended, bowel sounds present  Extremities: No edema, pulses DP and PT palpable bilaterally  Neuro: Grossly nonfocal  Data Reviewed: Basic Metabolic Panel:  Recent Labs Lab 09/16/14 1433 09/17/14 0446  NA 139 137  K 4.6 4.0  CL 104 104  CO2 29 26  GLUCOSE 114* 136*  BUN 22* 15  CREATININE 0.57 0.57  CALCIUM 9.4 9.1   Liver Function Tests:  Recent Labs Lab 09/17/14 0446  AST 16  ALT 15  ALKPHOS 69  BILITOT 1.2  PROT 6.4*  ALBUMIN 3.4*    Recent Labs Lab 09/16/14 2344  LIPASE 14*   No results for input(s): AMMONIA in the last 168 hours. CBC:  Recent Labs Lab 09/16/14 1433 09/17/14 0446  WBC 7.5 8.5  NEUTROABS 5.4 7.4  HGB 12.3 12.5  HCT 38.9 38.1  MCV 93.1 92.9  PLT 176 207   Cardiac Enzymes: No results for input(s): CKTOTAL, CKMB, CKMBINDEX, TROPONINI in the last 168 hours. BNP: Invalid input(s): POCBNP CBG: No results for input(s): GLUCAP in the last 168 hours.  No results found for this or any previous visit (from the past 240 hour(s)).   Scheduled Meds: . amitriptyline  10 mg Oral QHS  . aspirin EC  81 mg Oral Daily  . docusate sodium  100 mg Oral Daily  . DULoxetine  60 mg Oral Daily  . enoxaparin (LOVENOX)  40 mg Subcutaneous Q24H  . folic acid  1 mg Oral Daily  . mirabegron ER  50 mg Oral QHS  . pantoprazole  40 mg Oral Daily  . predniSONE  20 mg Oral Q breakfast

## 2014-09-17 NOTE — Evaluation (Signed)
Occupational Therapy Evaluation Patient Details Name: CHRISTENA SUNDERLIN MRN: [id number] DOB: 10-26-1924 Today's Date: 09/17/2014    History of Present Illness 81 y.o. female with Past medical history of osteoarthritis, coronary artery disease, hypertension, gait disturbances, recurrent fall, GERD and presented to ED after fall at home with chronic leg pain.   Clinical Impression   Pt was admitted for the above. At baseline, pt uses bathroom and dresses herself at mod I level. She currently needs mod A overall at this time and has posterior LOB.  She will benefit from skilled OT to increase safety and independence with adls.  Goals in acute are for min A   Follow Up Recommendations  Supervision/Assistance - 24 hour;Home health OT (needs assistance for sit to stand and transfers (vs SNF))    Equipment Recommendations  None recommended by OT    Recommendations for Other Services       Precautions / Restrictions Precautions Precautions: Fall Restrictions Weight Bearing Restrictions: No      Mobility Bed Mobility Overal bed mobility: Needs Assistance Bed Mobility: Supine to Sit      Sit to supine: Min assist   General bed mobility comments: assist for LLE  Transfers Overall transfer level: Needs assistance Equipment used: Rolling walker (2 wheeled) Transfers: Sit to/from Stand Sit to Stand: Mod assist         General transfer comment: assist to rise and stabilize; tends to lose balance posteriorly.  Slow transition of hands from surface to RW    Balance Overall balance assessment: Needs assistance; h/o falls         Standing balance support: Bilateral upper extremity supported Standing balance-Leahy Scale: Poor                              ADL Overall ADL's : Needs assistance/impaired     Grooming: Set up;Sitting   Upper Body Bathing: Set up;Sitting   Lower Body Bathing: Moderate assistance;Sit to/from stand;With adaptive equipment   Upper Body  Dressing : Set up;Sitting   Lower Body Dressing: Moderate assistance;Sit to/from stand;With adaptive equipment   Toilet Transfer: Moderate assistance;Stand-pivot;BSC;RW   Toileting- Clothing Manipulation and Hygiene: Total assistance;Sit to/from stand         General ADL Comments: pt uses reacher and sock aide for dressing:  mod A for sit to stand and SPT.  Pt has a tendency to lose balance posteriorly     Vision     Perception     Praxis      Pertinent Vitals/Pain Pain Assessment: 0-10 Pain Score: 5  Pain Location: left leg Pain Descriptors / Indicators: Aching Pain Intervention(s): Limited activity within patient's tolerance;Monitored during session;Repositioned     Hand Dominance     Extremity/Trunk Assessment Upper Extremity Assessment Upper Extremity Assessment: Generalized weakness (able to lift bil UEs approximately 100 degrees)          Communication Communication Communication: No difficulties   Cognition Arousal/Alertness: Awake/alert Behavior During Therapy: WFL for tasks assessed/performed Overall Cognitive Status: Within Functional Limits for tasks assessed                     General Comments       Exercises       Shoulder Instructions      Home Living Family/patient expects to be discharged to:: Private residence Living Arrangements: Alone Available Help at Discharge: Family Type of Home: House  Home Layout: One level     Bathroom Shower/Tub: Occupational psychologist: Handicapped height     Home Equipment: Bedside commode;Walker - 2 wheels;Grab bars - toilet;Shower seat          Prior Functioning/Environment Level of Independence: Independent with assistive device(s);Needs assistance  Gait / Transfers Assistance Needed: mod I with RW ADL's / Homemaking Assistance Needed: states daughter helps her into shower and she sits and bathes   Comments: pt uses AE for dressing    OT Diagnosis: Generalized  weakness   OT Problem List: Decreased strength;Decreased activity tolerance;Impaired balance (sitting and/or standing);Pain   OT Treatment/Interventions: Self-care/ADL training;DME and/or AE instruction;Patient/family education;Balance training    OT Goals(Current goals can be found in the care plan section) Acute Rehab OT Goals Patient Stated Goal: get back to baseline; decreased pain OT Goal Formulation: With patient Time For Goal Achievement: 09/24/14 Potential to Achieve Goals: Good ADL Goals Pt Will Perform Lower Body Bathing: with min assist;with adaptive equipment;sit to/from stand Pt Will Perform Lower Body Dressing: with min assist;with adaptive equipment;sit to/from stand Pt Will Transfer to Toilet: with min assist;bedside commode;stand pivot transfer Pt Will Perform Toileting - Clothing Manipulation and hygiene: with min assist;sit to/from stand  OT Frequency: Min 2X/week   Barriers to D/C:            Co-evaluation              End of Session    Activity Tolerance: Patient limited by fatigue Patient left: in bed;with call bell/phone within reach;with bed alarm set   Time: 7841-2820 OT Time Calculation (min): 17 min Charges:  OT General Charges $OT Visit: 1 Procedure OT Evaluation $Initial OT Evaluation Tier I: 1 Procedure G-Codes:    Platon Arocho 10-03-2014, 3:17 PM  Lesle Chris, OTR/L [phone] 10/03/2014

## 2014-09-17 NOTE — Consult Note (Addendum)
   [facility] CM Inpatient Consult   09/17/2014  Tracy Sharp Oct 22, 1924 [id number]   Received referral from inpatient RNCM for [facility] [employer] services. Spoke with patient who asks that this Probation officer contact her daughter in Tracy Sharp at [phone]. Call made to [facility] who states she is agreeable to [facility] services. Patient was active in the past with [facility] for RNCM and [facility]. Patient had been discharged as there were no further needs. ALF list and respite information was given to [facility] back in May. Please see chart review tab and notes in EPIC for [facility]'s correspondence with Tracy Sharp, daughter in Sports coach. Tracy Sharp states patient did not qualify for Medicaid at the time and that they can not afford out of pocket expense for ALF. Tracy Sharp states patient has been depressed as of late from her husband's death 2.5 years ago. Patient lives alone and has had [name] in the past. Tracy Sharp states they would like [name] again at discharge if patient does not qualify for SNF. Inpatient RNCM spoke with Tracy Sharp as well during telephone call. Consents signed.  Tracy Sharp Sharp, daughter in law at [phone] asks that she be contacted for transition of care calls.   Tracy Rolling, MSN-Ed, RN,BSN [facility] Liaison [phone]

## 2014-09-17 NOTE — Discharge Instructions (Signed)

## 2014-09-17 NOTE — Discharge Summary (Signed)
Physician Discharge Summary  Tracy Sharp ZOX:[id number] DOB: [birth year] DOA: 09/16/2014  PCP: Vidal Schwalbe, MD  Admit date: 09/16/2014 Discharge date: 09/17/2014  Recommendations for Outpatient Follow-up:  1. Follow up with PCP in 1-2 weeks after hospitalization for evaluation of weakness 2. No changes in medications   Discharge Diagnoses:  Principal Problem:   Chronic leg pain Active Problems:   Essential hypertension   Osteoarthritis   GAIT DISTURBANCE   GERD (gastroesophageal reflux disease)   Abdominal pain   Recurrent falls   Seronegative arthritis   General weakness    Discharge Condition: stable   Diet recommendation: as tolerated   History of present illness:  81 y.o. female with past medical history of osteoarthritis, coronary artery disease, hypertension, recurrent fall, gait imbalance, memory loss who presented to [facility] ED with difficulty walking. At baseline she walks with the walker. She feels week and per her family she has needed assistance more frequently in past few weeks or so. She feels unsteady when she is up and her family is concerned that she is not safe at home especially since she lives alone. On admission, she was stable hemodynamically. No acute fractures identified on imaging studies on admission.  Hospital Course:   Assessment/Plan:    Principal Problem: Recurrent falls / Weakness / Gait imbalance - No acute fractures identified on X rays and MRI lumbar spine  - Per PT evaluation - recommendation for HHPT and supervision for 24 hours  Active Problems: GERD - Continue protonix   Depression - Continue Cymbalta and Elavil    DVT Prophylaxis  - Lovenox subQ ordered    Code Status: DNR/DNI Family Communication: plan of care discussed with the patient and her daughter and daughter in law 09/17/2014 over the phone    IV access:  Peripheral IV  Procedures and diagnostic studies:   Dg Cervical Spine 2 Or 3 Views 09/16/2014  No evidence of fracture or subluxation along the cervical spine. Chronic osseous fusion at C5-C6. Electronically Signed By: Garald Balding M.D. On: 09/16/2014 23:39   Dg Thoracic Spine 2 View 09/16/2014 1. No evidence of fracture or subluxation along the thoracic spine. 2. Scattered vascular calcifications seen. Electronically Signed By: Garald Balding M.D. On: 09/16/2014 23:33   Ct Head Wo Contrast 09/16/2014 No intracranial injury or fracture. Electronically Signed By: Monte Fantasia M.D. On: 09/16/2014 23:22   Mr Lumbar Spine Wo Contrast 09/16/2014 1. Largely stable lumbar spine since April. No acute osseous abnormality identified. Chronic grade 1 anterolisthesis at L4-L5 and mild L1 compression fracture. 2. No acute right-sided neural impingement identified. There is up to moderate right lateral recess stenosis at L5-S1 and moderate bilateral foraminal stenosis elsewhere. There is stable mild multifactorial spinal stenosis at L1-L2. 3. There is a small left side synovial cyst now all at L4-L5 which could be a source of left L4 radiculitis. Electronically Signed By: Genevie Ann M.D. On: 09/16/2014 19:18   Dg Abd Acute W/chest 09/16/2014 1. Grossly unremarkable bowel gas pattern. No free intra-abdominal air seen. 2. Mild cardiomegaly. Lungs remain grossly clear. 3. Diffuse vascular calcifications seen. Electronically Signed By: Garald Balding M.D. On: 09/16/2014 23:31    Medical Consultants:  None   Other Consultants:  PT  IAnti-Infectives:   None      Signed:  Leisa Lenz, MD  [facility] 09/17/2014, 5:03 PM  Pager #: [phone]  Time spent in minutes: less than 30 minutes   Discharge Exam: Filed Vitals:   09/17/14 1418  BP: 127/50  Pulse:  80  Temp: 97.9 F (36.6 C)  Resp: 18   Filed Vitals:   09/16/14 2200 09/17/14 0121 09/17/14 0528 09/17/14 1418  BP: 133/47 137/58 124/43 127/50  Pulse: 80 81 83 80  Temp:  98.1 F (36.7  C) 97.5 F (36.4 C) 97.9 F (36.6 C)  TempSrc:  Oral Oral Oral  Resp: 23 19 19 18   Height:  5\' 6"  (1.676 m)    Weight:  80.2 kg (176 lb 12.9 oz) 80.2 kg (176 lb 12.9 oz)   SpO2: 97% 98% 94% 100%    General: Pt is alert, follows commands appropriately, not in acute distress Cardiovascular: Regular rate and rhythm, S1/S2 +, no murmurs Respiratory: Clear to auscultation bilaterally, no wheezing, no crackles, no rhonchi Abdominal: Soft, non tender, non distended, bowel sounds +, no guarding Extremities: no edema, no cyanosis, pulses palpable bilaterally DP and PT Neuro: Grossly nonfocal  Discharge Instructions  Discharge Instructions    AMB Referral to [facility] Care Management    Complete by:  As directed   Patient was active in the past and was discharged in May. [facility] RNCM for transition of care and [facility] LCSW for continued questions and concerns regarding Medicaid and the like. Currently in hospital and likely to discharge soon. [name] Job [phone] (daughter in law) is primary contact. Consents signed. Please call with questions. Thanks. Marthenia Rolling, [place], RN,BSN-[facility] Liaison-[phone]  Reason for consult:  Please assign to [place] and [facility] LCSW  Expected date of contact:  1-3 days (reserved for hospital discharges)     Call MD for:  difficulty breathing, headache or visual disturbances    Complete by:  As directed      Call MD for:  persistant nausea and vomiting    Complete by:  As directed      Call MD for:  severe uncontrolled pain    Complete by:  As directed      Diet - low sodium heart healthy    Complete by:  As directed      Increase activity slowly    Complete by:  As directed             Medication List    TAKE these medications        acetaminophen 500 MG tablet  Commonly known as:  TYLENOL  Take 1,000 mg by mouth every 6 (six) hours as needed for moderate pain (arthritis pain).     amitriptyline 10 MG tablet  Commonly known as:   ELAVIL  Take 10 mg by mouth at bedtime.     ARTIFICIAL TEARS OP  Place 2 drops into both eyes 2 (two) times daily.     aspirin 81 MG tablet  Take 81 mg by mouth daily.     CALCIUM PO  Take 30 mLs by mouth daily.     cholecalciferol 1000 UNITS tablet  Commonly known as:  VITAMIN D  Take 1,000 Units by mouth daily.     docusate sodium 100 MG capsule  Commonly known as:  COLACE  Take 100 mg by mouth every other day.     DULoxetine 60 MG capsule  Commonly known as:  CYMBALTA  Take 60 mg by mouth daily.     EYE LUBRICANT OP  Place 1 application into both eyes at bedtime.     folic acid 1 MG tablet  Commonly known as:  FOLVITE  Take 1 mg by mouth daily.     HYDROcodone-acetaminophen 5-325 MG per  tablet  Commonly known as:  NORCO/VICODIN  Take 1 tablet by mouth every 6 (six) hours as needed.     lansoprazole 30 MG capsule  Commonly known as:  PREVACID  Take 30 mg by mouth daily at 12 noon.     LORazepam 0.5 MG tablet  Commonly known as:  ATIVAN  Take 0.5 mg by mouth as needed for anxiety.     Melatonin 5 MG Tabs  Take 7.5 mg by mouth at bedtime.     methotrexate 2.5 MG tablet  Commonly known as:  RHEUMATREX  Take 10 mg by mouth once a week.     MYRBETRIQ 50 MG Tb24 tablet  Generic drug:  mirabegron ER  Take 50 mg by mouth at bedtime.           Follow-up Information    Follow up with Vidal Schwalbe, MD. Schedule an appointment as soon as possible for a visit in 1 week.   Specialty:  Family Medicine   Why:  Follow up appt after recent hospitalization   Contact information:   [address] [place] [address] [phone]        The results of significant diagnostics from this hospitalization (including imaging, microbiology, ancillary and laboratory) are listed below for reference.    Significant Diagnostic Studies: Dg Cervical Spine 2 Or 3 Views  09/16/2014   CLINICAL DATA:  Recent falls, with neck pain.  Initial encounter.  EXAM:  CERVICAL SPINE - 2-3 VIEW  COMPARISON:  CT of the cervical spine performed 06/08/2012  FINDINGS: There is no evidence of fracture or subluxation. There is chronic osseous fusion at C5-C6. Vertebral bodies demonstrate normal height and alignment. Intervertebral disc spaces are otherwise grossly preserved. Prevertebral soft tissues are within normal limits.  The visualized lung apices are clear.  IMPRESSION: No evidence of fracture or subluxation along the cervical spine. Chronic osseous fusion at C5-C6.   Electronically Signed   By: Garald Balding M.D.   On: 09/16/2014 23:39   Dg Thoracic Spine 2 View  09/16/2014   CLINICAL DATA:  Recent falls, with upper back pain. Initial encounter.  EXAM: THORACIC SPINE 2 VIEWS  COMPARISON:  Thoracic spine radiographs performed 04/28/2014  FINDINGS: There is no evidence of fracture or subluxation. Vertebral bodies demonstrate normal height and alignment. Intervertebral disc spaces are preserved.  The visualized portions of both lungs are clear. The mediastinum is unremarkable in appearance. Scattered vascular calcifications are seen.  IMPRESSION: 1. No evidence of fracture or subluxation along the thoracic spine. 2. Scattered vascular calcifications seen.   Electronically Signed   By: Garald Balding M.D.   On: 09/16/2014 23:33   Ct Head Wo Contrast  09/16/2014   CLINICAL DATA:  Multiple falls, most recently 3 days ago. Generalized headache. Initial encounter.  EXAM: CT HEAD WITHOUT CONTRAST  TECHNIQUE: Contiguous axial images were obtained from the base of the skull through the vertex without intravenous contrast.  COMPARISON:  06/08/2012  FINDINGS: Skull and Sinuses: Scarring in the left frontal scalp Negative for fracture or destructive process. The mastoids, middle ears, and imaged paranasal sinuses are clear.  Orbits: No traumatic findings.  Bilateral cataract resection  Brain: No evidence of acute infarction, hemorrhage, hydrocephalus, or mass lesion/mass effect.   Generalized atrophy with ventriculomegaly, similar to 2014.  IMPRESSION: No intracranial injury or fracture.   Electronically Signed   By: Monte Fantasia M.D.   On: 09/16/2014 23:22   Mr Lumbar Spine Wo Contrast  09/16/2014  CLINICAL DATA:  81 year old female who fell three days ago and is unable to walk today. Right lower extremity and foot drop. Initial encounter.  EXAM: MRI LUMBAR SPINE WITHOUT CONTRAST  TECHNIQUE: Multiplanar, multisequence MR imaging of the lumbar spine was performed. No intravenous contrast was administered.  COMPARISON:  Lumbar radiographs 06/20/2014.  Lumbar MRI 05/08/2014.  FINDINGS: Normal lumbar segmentation demonstrated in May. This is the same numbering system used in April. Chronic grade 1 anterolisthesis at L4-L5 is stable. Mild chronic L1 compression fracture is stable. Stable L2 benign vertebral body hemangioma. Visible sacrum is intact. No acute osseous abnormality identified.  Visualized lower thoracic spinal cord is normal with conus medularis at T12-L1.  Stable visualized abdominal viscera. Diverticulosis of the distal colon.  T9-T10: Partially visible, grossly negative.  T10-T11:  Negative.  T11-T12:  Mild facet hypertrophy, otherwise negative.  T12-L1:  Stable mild disc bulge.  Stable mild foraminal stenosis.  L1-L2: Stable circumferential disc bulge with broad-based superimposed protrusion. Stable mild to moderate facet hypertrophy. Mild spinal stenosis and moderate foraminal stenosis has not significantly changed.  L2-L3: Stable with far lateral disc osteophyte complex and mild facet hypertrophy.  L3-L4: Stable with left eccentric circumferential disc bulge and mild facet hypertrophy. Mild to moderate left L3 foraminal stenosis is stable.  L4-L5: Chronic anterolisthesis and previous postoperative changes to the right lamina. Circumferential disc/ pseudo disc bulge and moderate to severe facet hypertrophy. No significant spinal or lateral recess stenosis. Moderate left  and mild right L4 foraminal stenosis has not significantly changed aside from a small (3-4 mm) synovial cyst projecting toward the left neural foramen. See series 7, image 27 and series 6, image 11.  L5-S1: Stable circumferential disc osteophyte complex and moderate to severe facet hypertrophy greater on the right. No spinal stenosis. Stable moderate right lateral recess stenosis. Stable moderate bilateral L5 foraminal stenosis.  IMPRESSION: 1. Largely stable lumbar spine since April. No acute osseous abnormality identified. Chronic grade 1 anterolisthesis at L4-L5 and mild L1 compression fracture. 2. No acute right-sided neural impingement identified. There is up to moderate right lateral recess stenosis at L5-S1 and moderate bilateral foraminal stenosis elsewhere. There is stable mild multifactorial spinal stenosis at L1-L2. 3. There is a small left side synovial cyst now all at L4-L5 which could be a source of left L4 radiculitis.   Electronically Signed   By: Genevie Ann M.D.   On: 09/16/2014 19:18   Dg Abd Acute W/chest  09/16/2014   CLINICAL DATA:  Acute onset of dizziness and weakness. Neck pain. Recent falls. Initial encounter.  EXAM: DG ABDOMEN ACUTE W/ 1V CHEST  COMPARISON:  Thoracic spine radiograph performed 04/28/2014, and lumbar spine radiograph performed 06/20/2014  FINDINGS: The lungs are well-aerated. Pulmonary vascularity is at the upper limits of normal. There is no evidence of focal opacification, pleural effusion or pneumothorax. The cardiomediastinal silhouette is mildly enlarged.  The visualized bowel gas pattern is unremarkable. Scattered stool and air are seen within the colon; there is no evidence of small bowel dilatation to suggest obstruction. Visualized small bowel loops are borderline normal in size. No free intra-abdominal air is identified on the provided decubitus view. Scattered clips are seen at the right mid abdomen. Diffuse vascular calcifications are seen.  No acute osseous  abnormalities are seen; the sacroiliac joints are unremarkable in appearance.  IMPRESSION: 1. Grossly unremarkable bowel gas pattern. No free intra-abdominal air seen. 2. Mild cardiomegaly.  Lungs remain grossly clear. 3. Diffuse vascular calcifications seen.   Electronically  Signed   By: Garald Balding M.D.   On: 09/16/2014 23:31    Microbiology: No results found for this or any previous visit (from the past 240 hour(s)).   Labs: Basic Metabolic Panel:  Recent Labs Lab 09/16/14 1433 09/17/14 0446  NA 139 137  K 4.6 4.0  CL 104 104  CO2 29 26  GLUCOSE 114* 136*  BUN 22* 15  CREATININE 0.57 0.57  CALCIUM 9.4 9.1   Liver Function Tests:  Recent Labs Lab 09/17/14 0446  AST 16  ALT 15  ALKPHOS 69  BILITOT 1.2  PROT 6.4*  ALBUMIN 3.4*    Recent Labs Lab 09/16/14 2344  LIPASE 14*   No results for input(s): AMMONIA in the last 168 hours. CBC:  Recent Labs Lab 09/16/14 1433 09/17/14 0446  WBC 7.5 8.5  NEUTROABS 5.4 7.4  HGB 12.3 12.5  HCT 38.9 38.1  MCV 93.1 92.9  PLT 176 207   Cardiac Enzymes: No results for input(s): CKTOTAL, CKMB, CKMBINDEX, TROPONINI in the last 168 hours. BNP: BNP (last 3 results)  Recent Labs  09/16/14 2344  BNP 79.2    ProBNP (last 3 results) No results for input(s): PROBNP in the last 8760 hours.  CBG: No results for input(s): GLUCAP in the last 168 hours.

## 2014-09-18 ENCOUNTER — Other Ambulatory Visit: Payer: Self-pay | Admitting: *Deleted

## 2014-09-18 NOTE — Patient Outreach (Signed)
RNCM called pt's daughter in law per consent obtained by [facility] Marthenia Rolling. Spoke with [name], she expressed concern that pt's feet are starting to swell again and pt is unable walk as well as she could the day before. She states that pt's legs are not warm to touch but are tender to the touch. Daughter in law also concerned that when she called to make a f/u appointment the appointment was 2 weeks away. Daughter in law also concerned she had not heard from ordered home health agency. RNCM made a call to [facility] to f/u on patients status. Lars Mage confirmed pt was on their schedule and they would call daughter in law today. RNCM also called pt's PCP office and spoke with secretary Judeen Hammans and let her know that pt was inpatient and needed to be seen within 7 days. RNCM was able to get pt's appointment moved up. RNCM also asked Judeen Hammans if pt's symptom's worsened would the office be able to see them sooner, and she said yes. RNCM called daughter in law back and let her know this information and she voiced understanding. Also while on the phone with [facility], daughter in law had to go to answer another phone call that she felt was the [facility] agency.   Plan: RNCM will call pt tom 8/19 to confirm daughter in law heard from [facility] agency.  RNCM will report to covering RN that pt needs continued TOC calls. RNCM will send barrier letter to PCP

## 2014-09-18 NOTE — Care Management Note (Signed)
Case Management Note  Patient Details  Name: Tracy Sharp MRN: [id number] Date of Birth: 1924/04/09  Subjective/Objective:       Admitted with LE weakness, unable to ambulate             Action/Plan: Discharge planning, daughter in law would like to use [facility] for [facility] services. Contacted [facility], spoke with Stanton Kidney, faxed orders for start of care 8-17.  Expected Discharge Date:                  Expected Discharge Plan:  [place]  In-House Referral:  NA  Discharge planning Services  CM Consult  Post Acute Care Choice:  NA Choice offered to:  Patient, Sibling, Adult Children  DME Arranged:  N/A DME Agency:  NA  HH Arranged:  PT, OT, Nurse's Aide [place] Agency:  [facility]  Status of Service:  Completed, signed off  Medicare Important Message Given:    Date Medicare IM Given:    Medicare IM give by:    Date Additional Medicare IM Given:    Additional Medicare Important Message give by:     If discussed at [place] of Stay Meetings, dates discussed:    Additional Comments:  Guadalupe Maple, RN 09/18/2014, 2:41 PM

## 2014-09-19 ENCOUNTER — Other Ambulatory Visit: Payer: Self-pay | Admitting: *Deleted

## 2014-09-19 NOTE — Progress Notes (Signed)
   09/17/14 1500  OT Time Calculation  OT Start Time (ACUTE ONLY) 1438  OT Stop Time (ACUTE ONLY) 1455  OT Time Calculation (min) 17 min  OT G-codes **NOT FOR INPATIENT CLASS**  Functional Assessment Tool Used clinical observation and judgment  Functional Limitation Self care  Self Care Current Status (O1499) CM  Self Care Goal Status (U9249) CJ  OT General Charges  $OT Visit 1 Procedure  OT Evaluation  $Initial OT Evaluation Tier I 1 Procedure  Deann Mclaine, OTR/L [phone] 09/19/2014

## 2014-09-19 NOTE — Patient Outreach (Signed)
RNCM made follow up call with pt's daughter in law Zakia Sainato. Ms. Elmore stating pt' s legs are still swollen but have not gotten much worse related to her being able to put compression stockings on this am. Also,pt was able to ambulate better today than yesterday. Daughter in law was able to speak with a representative with the [place] agency yesterday, but they did not make a set time for appointment for Saturday as of this afternoon. Daughter in law stating pt did not rest well during the night related to leg pain. Daughter in law stated pt able to tolerate prescribed medications but noted this swelling and worsening of pain to pt's legs started after pt placed on a new medication for her arthritis(methotrexate on 8/9) and she wonders if it could be related. RNCM made a phone call to [facility] to inquire about the possibility of a potential reaction. Pharmacist advised RNCM to advise daughter in law to call Rheumatologist before patient's next scheduled dose on this up coming Tuesday(8/23). Pharmacist explained that this medication could cause a variety of side effects and could possibly be a contributing factor especially with the matching time line of doses. RNCM called daughter in law back and gave her this information and she stated she would certainly call Rheumatologist on Monday. RNCM made daughter in law aware that another RNCM from [facility] Kayleen Memos) would be covering next week and would be calling to follow up.    Plan: Make covering [facility] Kayleen Memos) aware of pt's need for f/u  Edwina Grossberg RN, BSN  [facility] Care Management [phone])

## 2014-09-20 DIAGNOSIS — M6281 Muscle weakness (generalized): Secondary | ICD-10-CM | POA: Diagnosis not present

## 2014-09-20 DIAGNOSIS — I10 Essential (primary) hypertension: Secondary | ICD-10-CM | POA: Diagnosis not present

## 2014-09-20 DIAGNOSIS — M4806 Spinal stenosis, lumbar region: Secondary | ICD-10-CM | POA: Diagnosis not present

## 2014-09-20 DIAGNOSIS — G8929 Other chronic pain: Secondary | ICD-10-CM | POA: Diagnosis not present

## 2014-09-20 DIAGNOSIS — M1990 Unspecified osteoarthritis, unspecified site: Secondary | ICD-10-CM | POA: Diagnosis not present

## 2014-09-20 DIAGNOSIS — Z9181 History of falling: Secondary | ICD-10-CM | POA: Diagnosis not present

## 2014-09-23 DIAGNOSIS — I10 Essential (primary) hypertension: Secondary | ICD-10-CM | POA: Diagnosis not present

## 2014-09-23 DIAGNOSIS — Z9181 History of falling: Secondary | ICD-10-CM | POA: Diagnosis not present

## 2014-09-23 DIAGNOSIS — M4806 Spinal stenosis, lumbar region: Secondary | ICD-10-CM | POA: Diagnosis not present

## 2014-09-23 DIAGNOSIS — M1990 Unspecified osteoarthritis, unspecified site: Secondary | ICD-10-CM | POA: Diagnosis not present

## 2014-09-23 DIAGNOSIS — M6281 Muscle weakness (generalized): Secondary | ICD-10-CM | POA: Diagnosis not present

## 2014-09-23 DIAGNOSIS — G8929 Other chronic pain: Secondary | ICD-10-CM | POA: Diagnosis not present

## 2014-09-24 ENCOUNTER — Other Ambulatory Visit: Payer: Self-pay | Admitting: *Deleted

## 2014-09-24 ENCOUNTER — Inpatient Hospital Stay (HOSPITAL_COMMUNITY)
Admission: EM | Admit: 2014-09-24 | Discharge: 2014-09-25 | DRG: 546 | Disposition: A | Discharge status: Home / Self Care | Payer: Medicare Other | Attending: Internal Medicine | Admitting: Internal Medicine

## 2014-09-24 ENCOUNTER — Encounter (HOSPITAL_COMMUNITY): Payer: Self-pay | Admitting: Emergency Medicine

## 2014-09-24 DIAGNOSIS — E785 Hyperlipidemia, unspecified: Secondary | ICD-10-CM | POA: Diagnosis not present

## 2014-09-24 DIAGNOSIS — K589 Irritable bowel syndrome without diarrhea: Secondary | ICD-10-CM | POA: Diagnosis not present

## 2014-09-24 DIAGNOSIS — M810 Age-related osteoporosis without current pathological fracture: Secondary | ICD-10-CM | POA: Diagnosis present

## 2014-09-24 DIAGNOSIS — I10 Essential (primary) hypertension: Secondary | ICD-10-CM | POA: Diagnosis present

## 2014-09-24 DIAGNOSIS — M069 Rheumatoid arthritis, unspecified: Secondary | ICD-10-CM | POA: Diagnosis not present

## 2014-09-24 DIAGNOSIS — E669 Obesity, unspecified: Secondary | ICD-10-CM | POA: Diagnosis present

## 2014-09-24 DIAGNOSIS — R700 Elevated erythrocyte sedimentation rate: Secondary | ICD-10-CM | POA: Diagnosis not present

## 2014-09-24 DIAGNOSIS — M79604 Pain in right leg: Secondary | ICD-10-CM | POA: Diagnosis not present

## 2014-09-24 DIAGNOSIS — Z89422 Acquired absence of other left toe(s): Secondary | ICD-10-CM

## 2014-09-24 DIAGNOSIS — R296 Repeated falls: Secondary | ICD-10-CM

## 2014-09-24 DIAGNOSIS — Z800 Family history of malignant neoplasm of digestive organs: Secondary | ICD-10-CM | POA: Diagnosis not present

## 2014-09-24 DIAGNOSIS — G8929 Other chronic pain: Secondary | ICD-10-CM | POA: Diagnosis present

## 2014-09-24 DIAGNOSIS — Z79899 Other long term (current) drug therapy: Secondary | ICD-10-CM

## 2014-09-24 DIAGNOSIS — M4856XA Collapsed vertebra, not elsewhere classified, lumbar region, initial encounter for fracture: Secondary | ICD-10-CM | POA: Diagnosis not present

## 2014-09-24 DIAGNOSIS — M79605 Pain in left leg: Secondary | ICD-10-CM | POA: Diagnosis not present

## 2014-09-24 DIAGNOSIS — R404 Transient alteration of awareness: Secondary | ICD-10-CM | POA: Diagnosis not present

## 2014-09-24 DIAGNOSIS — F419 Anxiety disorder, unspecified: Secondary | ICD-10-CM | POA: Diagnosis present

## 2014-09-24 DIAGNOSIS — Z833 Family history of diabetes mellitus: Secondary | ICD-10-CM

## 2014-09-24 DIAGNOSIS — M25561 Pain in right knee: Secondary | ICD-10-CM

## 2014-09-24 DIAGNOSIS — I2510 Atherosclerotic heart disease of native coronary artery without angina pectoris: Secondary | ICD-10-CM | POA: Diagnosis not present

## 2014-09-24 DIAGNOSIS — Z8379 Family history of other diseases of the digestive system: Secondary | ICD-10-CM

## 2014-09-24 DIAGNOSIS — Z885 Allergy status to narcotic agent status: Secondary | ICD-10-CM

## 2014-09-24 DIAGNOSIS — Z7982 Long term (current) use of aspirin: Secondary | ICD-10-CM

## 2014-09-24 DIAGNOSIS — R600 Localized edema: Secondary | ICD-10-CM | POA: Diagnosis not present

## 2014-09-24 DIAGNOSIS — R269 Unspecified abnormalities of gait and mobility: Secondary | ICD-10-CM | POA: Diagnosis not present

## 2014-09-24 DIAGNOSIS — N393 Stress incontinence (female) (male): Secondary | ICD-10-CM | POA: Diagnosis not present

## 2014-09-24 DIAGNOSIS — Z808 Family history of malignant neoplasm of other organs or systems: Secondary | ICD-10-CM

## 2014-09-24 DIAGNOSIS — F329 Major depressive disorder, single episode, unspecified: Secondary | ICD-10-CM | POA: Diagnosis present

## 2014-09-24 DIAGNOSIS — Z8249 Family history of ischemic heart disease and other diseases of the circulatory system: Secondary | ICD-10-CM

## 2014-09-24 DIAGNOSIS — K219 Gastro-esophageal reflux disease without esophagitis: Secondary | ICD-10-CM

## 2014-09-24 DIAGNOSIS — M0689 Other specified rheumatoid arthritis, multiple sites: Secondary | ICD-10-CM | POA: Diagnosis not present

## 2014-09-24 DIAGNOSIS — M79606 Pain in leg, unspecified: Secondary | ICD-10-CM | POA: Diagnosis not present

## 2014-09-24 DIAGNOSIS — R531 Weakness: Secondary | ICD-10-CM | POA: Diagnosis not present

## 2014-09-24 LAB — COMPREHENSIVE METABOLIC PANEL
ALK PHOS: 64 U/L (ref 38–126)
ALT: 12 U/L — AB (ref 14–54)
AST: 15 U/L (ref 15–41)
Albumin: 2.9 g/dL — ABNORMAL LOW (ref 3.5–5.0)
Anion gap: 10 (ref 5–15)
BILIRUBIN TOTAL: 0.9 mg/dL (ref 0.3–1.2)
BUN: 12 mg/dL (ref 6–20)
CALCIUM: 9 mg/dL (ref 8.9–10.3)
CO2: 24 mmol/L (ref 22–32)
CREATININE: 0.61 mg/dL (ref 0.44–1.00)
Chloride: 102 mmol/L (ref 101–111)
Glucose, Bld: 101 mg/dL — ABNORMAL HIGH (ref 65–99)
Potassium: 4 mmol/L (ref 3.5–5.1)
Sodium: 136 mmol/L (ref 135–145)
TOTAL PROTEIN: 6.3 g/dL — AB (ref 6.5–8.1)

## 2014-09-24 LAB — CBC WITH DIFFERENTIAL/PLATELET
BASOS ABS: 0 10*3/uL (ref 0.0–0.1)
Basophils Relative: 0 % (ref 0–1)
EOS PCT: 0 % (ref 0–5)
Eosinophils Absolute: 0 10*3/uL (ref 0.0–0.7)
HEMATOCRIT: 35.8 % — AB (ref 36.0–46.0)
Hemoglobin: 11.6 g/dL — ABNORMAL LOW (ref 12.0–15.0)
LYMPHS ABS: 0.7 10*3/uL (ref 0.7–4.0)
LYMPHS PCT: 9 % — AB (ref 12–46)
MCH: 29.9 pg (ref 26.0–34.0)
MCHC: 32.4 g/dL (ref 30.0–36.0)
MCV: 92.3 fL (ref 78.0–100.0)
Monocytes Absolute: 1.1 10*3/uL — ABNORMAL HIGH (ref 0.1–1.0)
Monocytes Relative: 13 % — ABNORMAL HIGH (ref 3–12)
NEUTROS ABS: 6.2 10*3/uL (ref 1.7–7.7)
Neutrophils Relative %: 78 % — ABNORMAL HIGH (ref 43–77)
Platelets: 246 10*3/uL (ref 150–400)
RBC: 3.88 MIL/uL (ref 3.87–5.11)
RDW: 12.9 % (ref 11.5–15.5)
WBC: 8 10*3/uL (ref 4.0–10.5)

## 2014-09-24 LAB — SEDIMENTATION RATE: SED RATE: 88 mm/h — AB (ref 0–22)

## 2014-09-24 LAB — TSH: TSH: 0.48 u[IU]/mL (ref 0.350–4.500)

## 2014-09-24 LAB — CK: CK TOTAL: 97 U/L (ref 38–234)

## 2014-09-24 MED ORDER — SENNOSIDES-DOCUSATE SODIUM 8.6-50 MG PO TABS: ORAL_TABLET | Freq: Every day | ORAL | Status: DC

## 2014-09-24 MED ORDER — DULOXETINE HCL 60 MG PO CPEP
60.0000 mg | ORAL_CAPSULE | Freq: Every day | ORAL | Status: DC
  Administered 2014-09-24 – 2014-09-25 (×2): 60 mg via ORAL
  Filled 2014-09-24 (×2): qty 1

## 2014-09-24 MED ORDER — AMITRIPTYLINE HCL 10 MG PO TABS
10.0000 mg | ORAL_TABLET | Freq: Every day | ORAL | Status: DC
  Filled 2014-09-24 (×3): qty 1

## 2014-09-24 MED ORDER — ARTIFICIAL TEARS OP OINT
TOPICAL_OINTMENT | Freq: Two times a day (BID) | OPHTHALMIC | Status: DC
Start: 2014-09-24 — End: 2014-09-25
  Administered 2014-09-24: 1 via OPHTHALMIC
  Administered 2014-09-25: 10:00:00 via OPHTHALMIC
  Filled 2014-09-24: qty 3.5

## 2014-09-24 MED ORDER — METHYLPREDNISOLONE SODIUM SUCC 40 MG IJ SOLR
40.0000 mg | Freq: Once | INTRAMUSCULAR | Status: AC
  Administered 2014-09-24: 40 mg via INTRAVENOUS
  Filled 2014-09-24: qty 1

## 2014-09-24 MED ORDER — OXYCODONE-ACETAMINOPHEN 5-325 MG PO TABS: 1.0000 | ORAL_TABLET | ORAL | Status: DC | PRN

## 2014-09-24 MED ORDER — TRAMADOL HCL 50 MG PO TABS
50.0000 mg | ORAL_TABLET | Freq: Two times a day (BID) | ORAL | Status: DC | PRN
  Administered 2014-09-24: 50 mg via ORAL
  Filled 2014-09-24: qty 1

## 2014-09-24 MED ORDER — DOCUSATE SODIUM 100 MG PO CAPS
100.0000 mg | ORAL_CAPSULE | Freq: Two times a day (BID) | ORAL | Status: DC
  Administered 2014-09-24 – 2014-09-25 (×2): 100 mg via ORAL
  Filled 2014-09-24 (×2): qty 1

## 2014-09-24 MED ORDER — FOLIC ACID 1 MG PO TABS
1.0000 mg | ORAL_TABLET | Freq: Every day | ORAL | Status: DC
  Administered 2014-09-24 – 2014-09-25 (×2): 1 mg via ORAL
  Filled 2014-09-24 (×2): qty 1

## 2014-09-24 MED ORDER — ASPIRIN EC 81 MG PO TBEC
81.0000 mg | DELAYED_RELEASE_TABLET | Freq: Every day | ORAL | Status: DC
  Administered 2014-09-24 – 2014-09-25 (×2): 81 mg via ORAL
  Filled 2014-09-24 (×2): qty 1

## 2014-09-24 MED ORDER — SENNOSIDES-DOCUSATE SODIUM 8.6-50 MG PO TABS
1.0000 | ORAL_TABLET | Freq: Every day | ORAL | Status: DC
  Administered 2014-09-25: 1 via ORAL
  Filled 2014-09-24: qty 1

## 2014-09-24 MED ORDER — GABAPENTIN 100 MG PO CAPS
100.0000 mg | ORAL_CAPSULE | Freq: Every day | ORAL | Status: DC
  Administered 2014-09-24: 100 mg via ORAL
  Filled 2014-09-24 (×2): qty 1

## 2014-09-24 MED ORDER — PREDNISONE 20 MG PO TABS
40.0000 mg | ORAL_TABLET | Freq: Every day | ORAL | Status: DC
  Administered 2014-09-25: 40 mg via ORAL
  Filled 2014-09-24: qty 2

## 2014-09-24 MED ORDER — TRAMADOL HCL 50 MG PO TABS: 50.0000 mg | ORAL_TABLET | Freq: Two times a day (BID) | ORAL | Status: DC | PRN

## 2014-09-24 MED ORDER — ENOXAPARIN SODIUM 40 MG/0.4ML ~~LOC~~ SOLN
40.0000 mg | SUBCUTANEOUS | Status: DC
  Administered 2014-09-24: 40 mg via SUBCUTANEOUS
  Filled 2014-09-24: qty 0.4

## 2014-09-24 MED ORDER — MIRABEGRON ER 50 MG PO TB24
50.0000 mg | ORAL_TABLET | Freq: Every day | ORAL | Status: DC
  Administered 2014-09-24: 50 mg via ORAL
  Filled 2014-09-24 (×2): qty 1

## 2014-09-24 MED ORDER — ENSURE ENLIVE PO LIQD
237.0000 mL | Freq: Two times a day (BID) | ORAL | Status: DC
  Administered 2014-09-25: 237 mL via ORAL
  Filled 2014-09-24 (×5): qty 237

## 2014-09-24 MED ORDER — ONDANSETRON 4 MG PO TBDP
4.0000 mg | ORAL_TABLET | Freq: Once | ORAL | Status: AC
  Administered 2014-09-24: 4 mg via ORAL
  Filled 2014-09-24: qty 1

## 2014-09-24 MED ORDER — METHOTREXATE 2.5 MG PO TABS
10.0000 mg | ORAL_TABLET | ORAL | Status: DC
  Administered 2014-09-24: 10 mg via ORAL
  Filled 2014-09-24: qty 4

## 2014-09-24 MED ORDER — OXYCODONE HCL 5 MG PO TABS: 5.0000 mg | ORAL_TABLET | ORAL | Status: DC | PRN

## 2014-09-24 MED ORDER — PREDNISONE 20 MG PO TABS: 50.0000 mg | ORAL_TABLET | Freq: Every day | ORAL | Status: DC

## 2014-09-24 MED ORDER — OXYCODONE-ACETAMINOPHEN 5-325 MG PO TABS
0.5000 | ORAL_TABLET | Freq: Once | ORAL | Status: AC
  Administered 2014-09-24: 0.5 via ORAL
  Filled 2014-09-24: qty 1

## 2014-09-24 MED ORDER — PANTOPRAZOLE SODIUM 20 MG PO TBEC
20.0000 mg | DELAYED_RELEASE_TABLET | Freq: Every day | ORAL | Status: DC
  Administered 2014-09-24 – 2014-09-25 (×2): 20 mg via ORAL
  Filled 2014-09-24 (×2): qty 1
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
## 2014-09-24 NOTE — Patient Outreach (Signed)
[facility]) Care Management  09/24/2014  Tracy Sharp [birth year] [id number]  Patient referred to this social worker to assist with Medicaid questions.  Per chart, patient is currently in the ED.  This social worker will follow up post discharge from the ED.   [name] [facility] Care Management [phone]

## 2014-09-24 NOTE — Evaluation (Signed)
Physical Therapy Evaluation Patient Details Name: Tracy Sharp MRN: [id number] DOB: 06-12-1924 Today's Date: 09/24/2014   History of Present Illness  81 y.o. female with Past medical history of osteoarthritis, coronary artery disease, hypertension, gait disturbances, recurrent fall, GERD and presented to ED after fall at home with chronic leg pain, recent admission to [facility] for similar incident. Multiple falls at home following that discharge.  Clinical Impression  Patient demonstrates deficits in functional mobility as indicated below. Will need continued skilled PT to address deficits and maximize function. Will see as indicated and progress as tolerated.  OF NOTE: family reports multiple falls following recent discharge from [facility]. Patient more limited with mobility and pain compared to that admission.    Follow Up Recommendations SNF;Supervision/Assistance - 24 hour (if denied SNF, at current functional level, patient will need hosptial bed, wheel chair, hoyer lift, 24/7 physical assist and possible medical transport home)    Equipment Recommendations    (if denied SNF, at current functional level, patient will need hosptial bed, wheel chair, hoyer lift, 24/7 physical assist and possible medical transport home)   Recommendations for Other Services       Precautions / Restrictions Precautions Precautions: Fall Restrictions Weight Bearing Restrictions: No      Mobility  Bed Mobility Overal bed mobility: Needs Assistance;+2 for physical assistance Bed Mobility: Supine to Sit;Sit to Supine     Supine to sit: Mod assist;+2 for physical assistance Sit to supine: Mod assist;+2 for physical assistance   General bed mobility comments: Moderate assist of 2 to come to EOB secondary to LLE pain and increased anxiety. use of chuck pad to rotate trunk to EOB  Transfers Overall transfer level: Needs assistance Equipment used: 2 person hand held assist (face to face with gait belt and chuck  pad) Transfers: Sit to/from Stand Sit to Stand: Max assist;+2 safety/equipment         General transfer comment: patient able to initiate push to stand, but unable to take on weight through LLE with inability to reach fully upright secodnary to pain. Patient able to clear hips from bed briefly with 2 person max assist  Ambulation/Gait             General Gait Details: unable to tolerate or perform  Stairs            Wheelchair Mobility    Modified Rankin (Stroke Patients Only)       Balance Overall balance assessment: Needs assistance;History of Falls Sitting-balance support: Feet supported Sitting balance-Leahy Scale: Fair       Standing balance-Leahy Scale: Zero                               Pertinent Vitals/Pain Pain Assessment: 0-10 Pain Score: 8  Pain Location: left leg Pain Descriptors / Indicators: Aching Pain Intervention(s): Limited activity within patient's tolerance;Monitored during session;Repositioned    Home Living Family/patient expects to be discharged to:: Private residence Living Arrangements: Alone Available Help at Discharge: Family Type of Home: House         Home Equipment: Bedside commode;Walker - 2 wheels;Grab bars - toilet;Shower seat      Prior Function Level of Independence: Independent with assistive device(s);Needs assistance   Gait / Transfers Assistance Needed: mod I with RW  ADL's / Homemaking Assistance Needed: states daughter helps her into shower and she sits and bathes  Comments: pt uses AE for dressing     Hand  Dominance        Extremity/Trunk Assessment               Lower Extremity Assessment: Generalized weakness;LLE deficits/detail   LLE Deficits / Details: LLE weakness, noted, unable to lift to full clearance from bed, could not tolerate resistance of any kind during assessment of strength. 2/5 dorsiflexion but unable to tolerate resistance for this motion as well.      Communication   Communication: No difficulties  Cognition Arousal/Alertness: Awake/alert Behavior During Therapy: WFL for tasks assessed/performed Overall Cognitive Status: Within Functional Limits for tasks assessed                      General Comments      Exercises        Assessment/Plan    PT Assessment Patient needs continued PT services  PT Diagnosis Difficulty walking;Generalized weakness   PT Problem List Decreased mobility;Decreased strength;Decreased activity tolerance  PT Treatment Interventions Gait training;DME instruction;Patient/family education;Functional mobility training;Therapeutic activities;Therapeutic exercise;Balance training   PT Goals (Current goals can be found in the Care Plan section) Acute Rehab PT Goals Patient Stated Goal: get back to baseline; decreased pain PT Goal Formulation: With patient Time For Goal Achievement: 10/08/14 Potential to Achieve Goals: Good    Frequency Min 3X/week   Barriers to discharge        Co-evaluation               End of Session Equipment Utilized During Treatment: Gait belt Activity Tolerance: Patient tolerated treatment well Patient left: in bed;with call bell/phone within reach;with bed alarm set;with family/visitor present Nurse Communication: Mobility status    Functional Assessment Tool Used: clinical judgement Functional Limitation: Mobility: Walking and moving around Mobility: Walking and Moving Around Current Status (E7517): At least 60 percent but less than 80 percent impaired, limited or restricted Mobility: Walking and Moving Around Goal Status [phone]): At least 1 percent but less than 20 percent impaired, limited or restricted    Time: 9449-6759 PT Time Calculation (min) (ACUTE ONLY): 21 min   Charges:   PT Evaluation $Initial PT Evaluation Tier I: 1 Procedure     PT G Codes:   PT G-Codes **NOT FOR INPATIENT CLASS** Functional Assessment Tool Used: clinical  judgement Functional Limitation: Mobility: Walking and moving around Mobility: Walking and Moving Around Current Status (F6384): At least 60 percent but less than 80 percent impaired, limited or restricted Mobility: Walking and Moving Around Goal Status [phone]): At least 1 percent but less than 20 percent impaired, limited or restricted    Duncan Dull 09/24/2014, 5:11 PM Alben Deeds, [place] DPT  [phone]

## 2014-09-24 NOTE — ED Notes (Signed)
Pt placed in gown and in bed. Pt monitored by pulse ox and bp cuff. 

## 2014-09-24 NOTE — ED Notes (Signed)
Pt reports bilateral leg pain for the last week. Reports seen last week at [facility] for same. Denies recent injury. States unable to walk. VSS. Distal circulation intact. 2+ dependent edema bilateral

## 2014-09-25 ENCOUNTER — Other Ambulatory Visit: Payer: Self-pay | Admitting: *Deleted

## 2014-09-25 DIAGNOSIS — M79606 Pain in leg, unspecified: Secondary | ICD-10-CM | POA: Diagnosis not present

## 2014-09-25 DIAGNOSIS — M069 Rheumatoid arthritis, unspecified: Principal | ICD-10-CM

## 2014-09-25 DIAGNOSIS — R600 Localized edema: Secondary | ICD-10-CM

## 2014-09-25 DIAGNOSIS — M79605 Pain in left leg: Secondary | ICD-10-CM

## 2014-09-25 DIAGNOSIS — G8929 Other chronic pain: Secondary | ICD-10-CM

## 2014-09-25 DIAGNOSIS — M4856XA Collapsed vertebra, not elsewhere classified, lumbar region, initial encounter for fracture: Secondary | ICD-10-CM | POA: Diagnosis not present

## 2014-09-25 DIAGNOSIS — I10 Essential (primary) hypertension: Secondary | ICD-10-CM | POA: Diagnosis not present

## 2014-09-25 DIAGNOSIS — E669 Obesity, unspecified: Secondary | ICD-10-CM | POA: Diagnosis not present

## 2014-09-25 DIAGNOSIS — M79602 Pain in left arm: Secondary | ICD-10-CM | POA: Diagnosis not present

## 2014-09-25 DIAGNOSIS — E785 Hyperlipidemia, unspecified: Secondary | ICD-10-CM | POA: Diagnosis not present

## 2014-09-25 LAB — COMPREHENSIVE METABOLIC PANEL
ALT: 13 U/L — ABNORMAL LOW (ref 14–54)
ANION GAP: 7 (ref 5–15)
AST: 18 U/L (ref 15–41)
Albumin: 2.3 g/dL — ABNORMAL LOW (ref 3.5–5.0)
Alkaline Phosphatase: 72 U/L (ref 38–126)
BUN: 11 mg/dL (ref 6–20)
CHLORIDE: 101 mmol/L (ref 101–111)
CO2: 30 mmol/L (ref 22–32)
CREATININE: 0.57 mg/dL (ref 0.44–1.00)
Calcium: 9 mg/dL (ref 8.9–10.3)
Glucose, Bld: 159 mg/dL — ABNORMAL HIGH (ref 65–99)
POTASSIUM: 4.3 mmol/L (ref 3.5–5.1)
SODIUM: 138 mmol/L (ref 135–145)
Total Bilirubin: 0.5 mg/dL (ref 0.3–1.2)
Total Protein: 6.1 g/dL — ABNORMAL LOW (ref 6.5–8.1)

## 2014-09-25 LAB — CBC
HCT: 36 % (ref 36.0–46.0)
Hemoglobin: 11.5 g/dL — ABNORMAL LOW (ref 12.0–15.0)
MCH: 29.6 pg (ref 26.0–34.0)
MCHC: 31.9 g/dL (ref 30.0–36.0)
MCV: 92.5 fL (ref 78.0–100.0)
PLATELETS: 257 10*3/uL (ref 150–400)
RBC: 3.89 MIL/uL (ref 3.87–5.11)
RDW: 12.7 % (ref 11.5–15.5)
WBC: 7.5 10*3/uL (ref 4.0–10.5)

## 2014-09-25 LAB — SEDIMENTATION RATE: SED RATE: 92 mm/h — AB (ref 0–22)

## 2014-09-25 MED ORDER — GABAPENTIN 100 MG PO CAPS: 100.0000 mg | ORAL_CAPSULE | Freq: Every day | ORAL | Status: DC

## 2014-09-25 MED ORDER — AMITRIPTYLINE HCL 10 MG PO TABS
10.0000 mg | ORAL_TABLET | Freq: Every day | ORAL | Status: DC
  Administered 2014-09-25: 10 mg via ORAL
  Filled 2014-09-25 (×2): qty 1

## 2014-09-25 MED ORDER — ENSURE ENLIVE PO LIQD: 237.0000 mL | Freq: Two times a day (BID) | ORAL | Status: DC

## 2014-09-25 MED ORDER — PREDNISONE 20 MG PO TABS: ORAL_TABLET | ORAL | Status: DC

## 2014-09-25 NOTE — Progress Notes (Signed)
PROGRESS NOTE  Tracy Sharp HYQ:[id number] DOB: 1924-02-13 DOA: 09/24/2014 PCP: Tracy Schwalbe, MD  Assessment/Plan: Acute and chronic leg pain (left)/ Rheumatoid arthritis flare/ Elevated sedimentation rate -Initial focus will be on pain management and control of inflammatory process -s/p one-time dose Solu-Medrol 40 mg IV in ER and again prednisone 40 mg daily (likely will need 40 mg daily prednisone for at least 1 week before begin taper) -Add Neurontin 100 mg at hour of sleep -Begin Percocet 5/325 one to 2 tablets every 4 hours as needed for pain and given conjunction with stool softener and other laxatives agents to prevent constipation -Continue Rheumatrex with next dose being due next Tuesday -PT/OT evaluation -May have superimposed polymyalgia rheumatica -family open for palliative care consult   Bilateral lower extremity edema/marked varicosities -Likely contributing to lower extremity edema as well as precipitating pain response -I have ordered thigh high TED hose which need to be continued after discharge -Patient with associated low serum albumin with reported history of poor oral intake secondary to pain for several weeks-ensure supplementation added  Marked deconditioning/history of a disturbance and recurrent falls -Likely related to patient's ongoing pain in this mobility ongoing for several weeks -PT/OT evaluation as above   HLD  -Not on statin   Stress incontinence/overactive bladder -Continue Myrbetriq -Recommend bedside commode at home -Recommend utilize pull up adult diaper such as depends without plastic outer covering after discharge   Essential hypertension -Current blood pressure controlled -Patient not on medications prior to admission   Obesity -Contributing to patient's dysmotility and family inability to manage patient from a physical standpoint   GERD  -Continue Prevacid especially in anticipation of need for regular steroids after  discharge   Code Status: DNR Family Communication: daughter on phone Disposition Plan:    Consultants:  palliative care  Procedures:      HPI/Subjective: Pain improved  Objective: Filed Vitals:   09/25/14 0946  BP: 117/56  Pulse: 73  Temp: 92.3 F (33.5 C)  Resp: 20   No intake or output data in the 24 hours ending 09/25/14 1245 There were no vitals filed for this visit.  Exam:   General:  Difficult to awaken- chronically ill appearing  Cardiovascular: rrr  Respiratory: clear  Abdomen: +Bs, soft  Musculoskeletal: no edema   Data Reviewed: Basic Metabolic Panel:  Recent Labs Lab 09/24/14 1034 09/25/14 0551  NA 136 138  K 4.0 4.3  CL 102 101  CO2 24 30  GLUCOSE 101* 159*  BUN 12 11  CREATININE 0.61 0.57  CALCIUM 9.0 9.0   Liver Function Tests:  Recent Labs Lab 09/24/14 1034 09/25/14 0551  AST 15 18  ALT 12* 13*  ALKPHOS 64 72  BILITOT 0.9 0.5  PROT 6.3* 6.1*  ALBUMIN 2.9* 2.3*   No results for input(s): LIPASE, AMYLASE in the last 168 hours. No results for input(s): AMMONIA in the last 168 hours. CBC:  Recent Labs Lab 09/24/14 1034 09/25/14 0551  WBC 8.0 7.5  NEUTROABS 6.2  --   HGB 11.6* 11.5*  HCT 35.8* 36.0  MCV 92.3 92.5  PLT 246 257   Cardiac Enzymes:  Recent Labs Lab 09/24/14 1034  CKTOTAL 97   BNP (last 3 results)  Recent Labs  09/16/14 2344  BNP 79.2    ProBNP (last 3 results) No results for input(s): PROBNP in the last 8760 hours.  CBG: No results for input(s): GLUCAP in the last 168 hours.  No results found for this or any previous visit (  from the past 240 hour(s)).   Studies: No results found.  Scheduled Meds: . amitriptyline  10 mg Oral QHS  . artificial tears   Both Eyes BID  . aspirin EC  81 mg Oral Daily  . docusate sodium  100 mg Oral BID  . DULoxetine  60 mg Oral Daily  . enoxaparin (LOVENOX) injection  40 mg Subcutaneous Q24H  . feeding supplement (ENSURE ENLIVE)  237 mL Oral BID  BM  . folic acid  1 mg Oral Daily  . gabapentin  100 mg Oral QHS  . methotrexate  10 mg Oral Weekly  . mirabegron ER  50 mg Oral QHS  . pantoprazole  20 mg Oral Daily  . predniSONE  40 mg Oral Q breakfast  . senna-docusate  1 tablet Oral Daily   Continuous Infusions:  Antibiotics Given (last 72 hours)    None      Principal Problem:   Acute leg pain (left) Active Problems:   HLD (hyperlipidemia)   Obesity   Essential hypertension   GAIT DISTURBANCE   Chronic leg pain   GERD (gastroesophageal reflux disease)   Recurrent falls   Elevated sedimentation rate   Bilateral lower extremity edema   Rheumatoid arthritis flare    Time spent: 25 min    Tracy Sharp, [facility] Pager [phone]. If 7PM-7AM, please contact night-coverage at [email or web address], password [facility] 09/25/2014, 12:45 PM  LOS: 1 day

## 2014-09-25 NOTE — Evaluation (Signed)
Occupational Therapy Evaluation Patient Details Name: Tracy Sharp MRN: [id number] DOB: 1925/01/27 Today's Date: 09/25/2014    History of Present Illness 81 y.o. female with Past medical history of osteoarthritis, coronary artery disease, hypertension, gait disturbances, recurrent fall, GERD and presented to ED after fall at home with chronic leg pain, recent admission to [facility] for similar incident. Multiple falls at home following that discharge.   Clinical Impression   PT admitted with s/p fall. Pt currently with functional limitiations due to the deficits listed below (see OT problem list). Pt requires assistance with adls and transfers. Pt sleeping in lift chair at baseline. Pt will benefit from skilled OT to increase their independence and safety with adls and balance to allow discharge [facility]. Pt currently scheduled to d/c home with family.      Follow Up Recommendations  Home health OT;Supervision/Assistance - 24 hour (plans to d/c home)    Equipment Recommendations  None recommended by OT    Recommendations for Other Services       Precautions / Restrictions Precautions Precautions: Fall      Mobility Bed Mobility Overal bed mobility: Needs Assistance Bed Mobility: Supine to Sit     Supine to sit: Min assist Sit to supine: Min assist   General bed mobility comments: use of bed rail  Transfers Overall transfer level: Needs assistance Equipment used: 1 person hand held assist Transfers: Sit to/from Stand Sit to Stand: Mod assist         General transfer comment: posterior LOB initially and reaching for all environmental supports. Pt needed mod cueing to static stand and build confidence that patient would not fall during session. Pt allowed to return to sitting to make patient aware that bed is immediately available to sit    Balance Overall balance assessment: Needs assistance Sitting-balance support: No upper extremity supported;Feet supported Sitting  balance-Leahy Scale: Good     Standing balance support: During functional activity;Single extremity supported Standing balance-Leahy Scale: Poor                              ADL Overall ADL's : Needs assistance/impaired     Grooming: Wash/dry face;Set up   Upper Body Bathing: Set up;Sitting   Lower Body Bathing: Maximal assistance;Sit to/from stand           Toilet Transfer: Maximal assistance;Stand-pivot             General ADL Comments: Pt progressed to EOB sitting with min (A) nad use of bed rail. Pt static sitting supervision level. Pt sit<>Stand x3 this session with posterior lean. Pt unable to maintain static standing initially. Pt with mulitple attempts and weight shift with OT able to achieve static standing min (A).     Vision     Perception     Praxis      Pertinent Vitals/Pain Pain Assessment: No/denies pain     Hand Dominance Right   Extremity/Trunk Assessment Upper Extremity Assessment Upper Extremity Assessment: Generalized weakness   Lower Extremity Assessment Lower Extremity Assessment: Defer to PT evaluation LLE Deficits / Details: R LE appears weaker than L LE this session.    Cervical / Trunk Assessment Cervical / Trunk Assessment: Kyphotic   Communication Communication Communication: HOH (with hearing aids)   Cognition Arousal/Alertness: Awake/alert Behavior During Therapy: WFL for tasks assessed/performed Overall Cognitive Status: Within Functional Limits for tasks assessed  General Comments       Exercises       Shoulder Instructions      Home Living Family/patient expects to be discharged to:: Private residence Living Arrangements: Alone Available Help at Discharge: Family Type of Home: House             Bathroom Shower/Tub: Walk-in shower   Bathroom Toilet: Handicapped height     Home Equipment: Bedside commode;Walker - 2 wheels;Grab bars - toilet;Shower seat (lift  chair)          Prior Functioning/Environment Level of Independence: Independent with assistive device(s);Needs assistance  Gait / Transfers Assistance Needed: mod I with RW ADL's / Homemaking Assistance Needed: states daughter helps her into shower and she sits and bathes   Comments: pt uses AE for dressing    OT Diagnosis: Generalized weakness   OT Problem List: Decreased strength;Decreased activity tolerance;Impaired balance (sitting and/or standing);Decreased knowledge of use of DME or AE;Decreased safety awareness;Decreased knowledge of precautions;Obesity   OT Treatment/Interventions: Self-care/ADL training;DME and/or AE instruction;Energy conservation;Therapeutic exercise;Patient/family education;Balance training    OT Goals(Current goals can be found in the care plan section) Acute Rehab OT Goals Patient Stated Goal: to be able to go with church group on monthly church community outtings OT Goal Formulation: With patient Time For Goal Achievement: 10/09/14 Potential to Achieve Goals: Good  OT Frequency: Min 2X/week   Barriers to D/C:            Co-evaluation              End of Session Nurse Communication: Mobility status;Precautions  Activity Tolerance: Patient tolerated treatment well Patient left: in bed;with call bell/phone within reach;with family/visitor present   Time: 1440-1510 OT Time Calculation (min): 30 min Charges:  OT General Charges $OT Visit: 1 Procedure OT Evaluation $Initial OT Evaluation Tier I: 1 Procedure OT Treatments $Self Care/Home Management : 8-22 mins G-Codes: OT G-codes **NOT FOR INPATIENT CLASS** Functional Assessment Tool Used: clinical judgement Functional Limitation: Self care Self Care Current Status (H4765): At least 60 percent but less than 80 percent impaired, limited or restricted Self Care Goal Status (Y6503): At least 40 percent but less than 60 percent impaired, limited or restricted Self Care Discharge Status  [phone]): At least 60 percent but less than 80 percent impaired, limited or restricted  Peri Maris 09/25/2014, 3:12 PM   Jeri Modena   OTR/L Pager: [phone] Office: [phone] .

## 2014-09-25 NOTE — Patient Outreach (Signed)
Transition of care call #1 - Talked with pt daughter, Tracy Sharp, HCPOA.  Pt had ED visit to admission 8/24 and home this afternoon 8/25. She was taken in for severe peripheral edema and pain. She is comfortable and resting now. Her medications were changed, I went over these with [name]. She had been told some of the medications may have an accumulative effect and she was concerned about this. I discussed signs of serotonin syndrome: hyperreflexia, sweating, seizure activity. This is an urgency and needs to be reported immediately for medical advice. I suggested she discuss this with Dr. Dema Severin on Tuesday 09/30/14.   I gave my phone number and nurse line number for additional support.  She will be getting home health and will be having a Hospice evaluation.  Deloria Lair [facility] [phone]

## 2014-09-25 NOTE — Progress Notes (Signed)
Pt for discharge home today. Discharge orders received. Family at bedside to assist with discharge. Transported to home by [facility] at 1600.

## 2014-09-25 NOTE — Discharge Summary (Signed)
Physician Discharge Summary  Tracy Sharp SEG:[id number] DOB: November 16, 1924 DOA: 09/24/2014  PCP: Vidal Schwalbe, MD  Admit date: 09/24/2014 Discharge date: 09/25/2014  Time spent: 35 minutes  Recommendations for Outpatient Follow-up:  1. Home health 2. Hospice to evaluate at home- (FTT) 3. Thigh high ted hose  Discharge Diagnoses:  Principal Problem:   Acute leg pain (left) Active Problems:   HLD (hyperlipidemia)   Obesity   Essential hypertension   GAIT DISTURBANCE   Chronic leg pain   GERD (gastroesophageal reflux disease)   Recurrent falls   Elevated sedimentation rate   Bilateral lower extremity edema   Rheumatoid arthritis flare   Discharge Condition: stable  Diet recommendation:   There were no vitals filed for this visit.  History of present illness:  This is a 81 year old female patient with known CAD, hypertension, gait disturbance, chronic bilateral lower extremity pain, bilateral varicosities and osteoporosis with a new diagnosis of rheumatoid arthritis who presented to the ER with complaints of increasing left lower extremity pain causing her to not be able to walk. Patient recently had observation admission 1 week ago for similar symptoms. Pain and mobility improved with the addition of Ultram and outpatient physical therapy was recommended with initial visit scheduled for today. Unfortunately since being discharge the patient has developed progressive bilateral lower extremity edema as well as increasing pain primarily in the left leg although at times the pain is migratory and can involve the hip girdle as well as the right lower extremity. Because of pain patient has significant difficulty standing and even when supine has difficulty lifting the left leg off of the bed. Mobility is further exacerbated by patient's stress incontinence history and fears she will not make it to the bathroom. Patient had an MRI during previous admission that showed no significant findings  to explain patient's symptoms other than a moderate right lateral recess stenosis at L of through S1 with stable mild multifactorial spinal stenosis at L1-L2; there was a small left-sided synovial cyst at L4-L5 which could be the source the patient's left L4 radiculitis. The patient is primarily cared for at home by one daughter who does not have any assistance. The patient weighs 176 pounds because of the patient's progressive dysmobility the daughter is finding she is unable to care for the patient alone. They do have a hospital bed as well as a lift chair available at the home.  In the ER the patient was afebrile and otherwise hemodynamically stable with room air saturations 99%. Electrolyte panel was unremarkable except for mild elevation in glucose of 101, albumin was low at 2.9, total protein also low at 6.3. Cause of the edema a BNP was checked and this was normal at 79.2, CK was normal at 97, lactic acid was also normal at 0.7. CBC was normal and without leukocytosis although patient does have a left shift with neutrophils 78% but normal absolute neutrophils. Patient has significantly elevated ESR of 88.  During the initial examination in the ER the daughter who is the patient's primary caretakers at the bedside as well as another daughter who lives out of town. Extensive discussion was held regarding specific criteria to meet inpatient admission status and expectations regarding nursing home placement which are dependent upon PT/OT evaluations. It was made clear to patient's family that at the present time her acute rheumatoid flare with dysmobility meets requirement for observational admission status only and unfortunately it appears the patient may eventually require discharge back to the home environment. We  are attempting to obtain PT/OT evaluations in the ER to speed up the process that could lead to skilled nursing facility placement if she meets specific criteria.   Hospital Course:  Patient  did not meet observation criteria.  Spoke with care giver regarding hospice and this is something they would be interested in, patient is active with [facility] and they can evaluate for hospice Steroid taper for RA flare- pain improved   Procedures:    Consultations:    Discharge Exam: Filed Vitals:   09/25/14 0946  BP: 117/56  Pulse: 73  Temp: 92.3 F (33.5 C)  Resp: 20     Discharge Instructions   Discharge Instructions    Diet - low sodium heart healthy    Complete by:  As directed      Discharge instructions    Complete by:  As directed   24 hour care Hospice to evaluate     Increase activity slowly    Complete by:  As directed           Current Discharge Medication List    START taking these medications   Details  feeding supplement, ENSURE ENLIVE, (ENSURE ENLIVE) LIQD Take 237 mLs by mouth 2 (two) times daily between meals. Qty: 237 mL, Refills: 12    gabapentin (NEURONTIN) 100 MG capsule Take 1 capsule (100 mg total) by mouth at bedtime. Qty: 30 capsule, Refills: 0    predniSONE (DELTASONE) 20 MG tablet 40 mg PO daily for 5 days then 30 mg PO x 2 days, 20 mg PO x 2 days, 10 mg x 2 days Qty: 32 tablet, Refills: 0      CONTINUE these medications which have NOT CHANGED   Details  acetaminophen (TYLENOL) 500 MG tablet Take 1,000 mg by mouth every 6 (six) hours as needed for moderate pain (arthritis pain).     amitriptyline (ELAVIL) 10 MG tablet Take 10 mg by mouth at bedtime. Refills: 0    Artificial Tear Ointment (EYE LUBRICANT OP) Place 1 application into both eyes at bedtime.     aspirin 81 MG tablet Take 81 mg by mouth daily.      CALCIUM PO Take 30 mLs by mouth daily.    cholecalciferol (VITAMIN D) 1000 UNITS tablet Take 1,000 Units by mouth daily.     DULoxetine (CYMBALTA) 60 MG capsule Take 60 mg by mouth daily.    folic acid (FOLVITE) 1 MG tablet Take 1 mg by mouth daily. Refills: 2    Hypromellose (ARTIFICIAL TEARS OP)  Place 2 drops into both eyes 2 (two) times daily.    lansoprazole (PREVACID) 30 MG capsule Take 30 mg by mouth daily at 12 noon.    Melatonin 5 MG TABS Take 7.5 mg by mouth at bedtime.    mirabegron ER (MYRBETRIQ) 50 MG TB24 tablet Take 50 mg by mouth at bedtime.     sennosides-docusate sodium (SENOKOT-S) 8.6-50 MG tablet Take 1 tablet by mouth daily.    traMADol (ULTRAM) 50 MG tablet Take 50 mg by mouth every 12 (twelve) hours as needed for severe pain.    methotrexate (RHEUMATREX) 2.5 MG tablet Take 10 mg by mouth once a week. 10 mg ( 4 tablets ) Refills: 2      STOP taking these medications     LORazepam (ATIVAN) 0.5 MG tablet      HYDROcodone-acetaminophen (NORCO/VICODIN) 5-325 MG per tablet        Allergies  Allergen Reactions  . Norco [Hydrocodone-Acetaminophen]  Nausea, dizziness  . Toviaz [Fesoterodine Fumarate Er]     dizzy  . Plaquenil [Hydroxychloroquine] Rash      The results of significant diagnostics from this hospitalization (including imaging, microbiology, ancillary and laboratory) are listed below for reference.    Significant Diagnostic Studies: Dg Cervical Spine 2 Or 3 Views  09/16/2014   CLINICAL DATA:  Recent falls, with neck pain.  Initial encounter.  EXAM: CERVICAL SPINE - 2-3 VIEW  COMPARISON:  CT of the cervical spine performed 06/08/2012  FINDINGS: There is no evidence of fracture or subluxation. There is chronic osseous fusion at C5-C6. Vertebral bodies demonstrate normal height and alignment. Intervertebral disc spaces are otherwise grossly preserved. Prevertebral soft tissues are within normal limits.  The visualized lung apices are clear.  IMPRESSION: No evidence of fracture or subluxation along the cervical spine. Chronic osseous fusion at C5-C6.   Electronically Signed   By: Garald Balding M.D.   On: 09/16/2014 23:39   Dg Thoracic Spine 2 View  09/16/2014   CLINICAL DATA:  Recent falls, with upper back pain. Initial encounter.  EXAM:  THORACIC SPINE 2 VIEWS  COMPARISON:  Thoracic spine radiographs performed 04/28/2014  FINDINGS: There is no evidence of fracture or subluxation. Vertebral bodies demonstrate normal height and alignment. Intervertebral disc spaces are preserved.  The visualized portions of both lungs are clear. The mediastinum is unremarkable in appearance. Scattered vascular calcifications are seen.  IMPRESSION: 1. No evidence of fracture or subluxation along the thoracic spine. 2. Scattered vascular calcifications seen.   Electronically Signed   By: Garald Balding M.D.   On: 09/16/2014 23:33   Ct Head Wo Contrast  09/16/2014   CLINICAL DATA:  Multiple falls, most recently 3 days ago. Generalized headache. Initial encounter.  EXAM: CT HEAD WITHOUT CONTRAST  TECHNIQUE: Contiguous axial images were obtained from the base of the skull through the vertex without intravenous contrast.  COMPARISON:  06/08/2012  FINDINGS: Skull and Sinuses: Scarring in the left frontal scalp Negative for fracture or destructive process. The mastoids, middle ears, and imaged paranasal sinuses are clear.  Orbits: No traumatic findings.  Bilateral cataract resection  Brain: No evidence of acute infarction, hemorrhage, hydrocephalus, or mass lesion/mass effect.  Generalized atrophy with ventriculomegaly, similar to 2014.  IMPRESSION: No intracranial injury or fracture.   Electronically Signed   By: Monte Fantasia M.D.   On: 09/16/2014 23:22   Mr Lumbar Spine Wo Contrast  09/16/2014   CLINICAL DATA:  81 year old female who fell three days ago and is unable to walk today. Right lower extremity and foot drop. Initial encounter.  EXAM: MRI LUMBAR SPINE WITHOUT CONTRAST  TECHNIQUE: Multiplanar, multisequence MR imaging of the lumbar spine was performed. No intravenous contrast was administered.  COMPARISON:  Lumbar radiographs 06/20/2014.  Lumbar MRI 05/08/2014.  FINDINGS: Normal lumbar segmentation demonstrated in May. This is the same numbering system used  in April. Chronic grade 1 anterolisthesis at L4-L5 is stable. Mild chronic L1 compression fracture is stable. Stable L2 benign vertebral body hemangioma. Visible sacrum is intact. No acute osseous abnormality identified.  Visualized lower thoracic spinal cord is normal with conus medularis at T12-L1.  Stable visualized abdominal viscera. Diverticulosis of the distal colon.  T9-T10: Partially visible, grossly negative.  T10-T11:  Negative.  T11-T12:  Mild facet hypertrophy, otherwise negative.  T12-L1:  Stable mild disc bulge.  Stable mild foraminal stenosis.  L1-L2: Stable circumferential disc bulge with broad-based superimposed protrusion. Stable mild to moderate facet hypertrophy. Mild spinal stenosis  and moderate foraminal stenosis has not significantly changed.  L2-L3: Stable with far lateral disc osteophyte complex and mild facet hypertrophy.  L3-L4: Stable with left eccentric circumferential disc bulge and mild facet hypertrophy. Mild to moderate left L3 foraminal stenosis is stable.  L4-L5: Chronic anterolisthesis and previous postoperative changes to the right lamina. Circumferential disc/ pseudo disc bulge and moderate to severe facet hypertrophy. No significant spinal or lateral recess stenosis. Moderate left and mild right L4 foraminal stenosis has not significantly changed aside from a small (3-4 mm) synovial cyst projecting toward the left neural foramen. See series 7, image 27 and series 6, image 11.  L5-S1: Stable circumferential disc osteophyte complex and moderate to severe facet hypertrophy greater on the right. No spinal stenosis. Stable moderate right lateral recess stenosis. Stable moderate bilateral L5 foraminal stenosis.  IMPRESSION: 1. Largely stable lumbar spine since April. No acute osseous abnormality identified. Chronic grade 1 anterolisthesis at L4-L5 and mild L1 compression fracture. 2. No acute right-sided neural impingement identified. There is up to moderate right lateral recess  stenosis at L5-S1 and moderate bilateral foraminal stenosis elsewhere. There is stable mild multifactorial spinal stenosis at L1-L2. 3. There is a small left side synovial cyst now all at L4-L5 which could be a source of left L4 radiculitis.   Electronically Signed   By: Genevie Ann M.D.   On: 09/16/2014 19:18   Dg Abd Acute W/chest  09/16/2014   CLINICAL DATA:  Acute onset of dizziness and weakness. Neck pain. Recent falls. Initial encounter.  EXAM: DG ABDOMEN ACUTE W/ 1V CHEST  COMPARISON:  Thoracic spine radiograph performed 04/28/2014, and lumbar spine radiograph performed 06/20/2014  FINDINGS: The lungs are well-aerated. Pulmonary vascularity is at the upper limits of normal. There is no evidence of focal opacification, pleural effusion or pneumothorax. The cardiomediastinal silhouette is mildly enlarged.  The visualized bowel gas pattern is unremarkable. Scattered stool and air are seen within the colon; there is no evidence of small bowel dilatation to suggest obstruction. Visualized small bowel loops are borderline normal in size. No free intra-abdominal air is identified on the provided decubitus view. Scattered clips are seen at the right mid abdomen. Diffuse vascular calcifications are seen.  No acute osseous abnormalities are seen; the sacroiliac joints are unremarkable in appearance.  IMPRESSION: 1. Grossly unremarkable bowel gas pattern. No free intra-abdominal air seen. 2. Mild cardiomegaly.  Lungs remain grossly clear. 3. Diffuse vascular calcifications seen.   Electronically Signed   By: Garald Balding M.D.   On: 09/16/2014 23:31    Microbiology: No results found for this or any previous visit (from the past 240 hour(s)).   Labs: Basic Metabolic Panel:  Recent Labs Lab 09/24/14 1034 09/25/14 0551  NA 136 138  K 4.0 4.3  CL 102 101  CO2 24 30  GLUCOSE 101* 159*  BUN 12 11  CREATININE 0.61 0.57  CALCIUM 9.0 9.0   Liver Function Tests:  Recent Labs Lab 09/24/14 1034  09/25/14 0551  AST 15 18  ALT 12* 13*  ALKPHOS 64 72  BILITOT 0.9 0.5  PROT 6.3* 6.1*  ALBUMIN 2.9* 2.3*   No results for input(s): LIPASE, AMYLASE in the last 168 hours. No results for input(s): AMMONIA in the last 168 hours. CBC:  Recent Labs Lab 09/24/14 1034 09/25/14 0551  WBC 8.0 7.5  NEUTROABS 6.2  --   HGB 11.6* 11.5*  HCT 35.8* 36.0  MCV 92.3 92.5  PLT 246 257   Cardiac Enzymes:  Recent Labs Lab 09/24/14 1034  CKTOTAL 97   BNP: BNP (last 3 results)  Recent Labs  09/16/14 2344  BNP 79.2    ProBNP (last 3 results) No results for input(s): PROBNP in the last 8760 hours.  CBG: No results for input(s): GLUCAP in the last 168 hours.     SignedEulogio Bear  [facility] 09/25/2014, 2:28 PM

## 2014-09-25 NOTE — Care Management Note (Signed)
Case Management Note  Patient Details  Name: DEYSY SCHABEL MRN: [id number] Date of Birth: 1924-02-06  Subjective/Objective:                    Action/Plan: Spoke extensively with patient's daughter-in-law Terika Pillard, who is aware that patient does not currently meet criterion for SNF placement.  Patient was active with [facility] prior to admission. CM spoke with Malachy Mood at [facility] [phone] to discuss discharge planning.  Current plan is to discharge patient home today and resume [place] services.  Patient will be evaluated by an [facility] RN to see if she qualifies for hospice services.  Patient has a hospital bed, lift chair, wheelchair and walker at home. Request for a drop arm BSC and hoyer lift was placed with [facility], who has provided her existing DME.  Patient will required transportation home. CSW aware.  Bedside RN aware.  Expected Discharge Date:                  Expected Discharge Plan:  [place]  In-House Referral:  Clinical Social Work  Discharge planning Services  CM Consult  Post Acute Care Choice:  Durable Medical Equipment, Home Health, Hospice Choice offered to:  Adult Children  DME Arranged:  3-N-1 (hoyer lift) DME Agency:  [facility] Arranged:  RN, PT, OT, Nurse's Aide (CSW) HH Agency:  [place]  Status of Service:  Completed, signed off  Medicare Important Message Given:    Date Medicare IM Given:    Medicare IM give by:    Date Additional Medicare IM Given:    Additional Medicare Important Message give by:     If discussed at [place] of Stay Meetings, dates discussed:    Additional Comments:  Rolm Baptise, RN 09/25/2014, 3:22 PM

## 2014-09-25 NOTE — Clinical Social Work Note (Signed)
CSW received a consult for SNF placement. CSW reviewed the chart. CSW noticed the patient class status and insurance. CSW spoke with the pt's daughter [name] and daughter-in-law [name]. CSW introduced self and purpose of the call. CSW discussed SNF rehab. CSW explained the SNF process. CSW explained insurance and its relation to SNF placement. CSW and family discussed discharge option and Medicaid process. [name] expressed being displease with the healthcare system. [name] requested to be noticed when the pt will discharge, so she can transport the pt. At this time CSW will sign off.   [place], MSW, [place]

## 2014-09-27 DIAGNOSIS — M6281 Muscle weakness (generalized): Secondary | ICD-10-CM | POA: Diagnosis not present

## 2014-09-27 DIAGNOSIS — Z9181 History of falling: Secondary | ICD-10-CM | POA: Diagnosis not present

## 2014-09-27 DIAGNOSIS — M1990 Unspecified osteoarthritis, unspecified site: Secondary | ICD-10-CM | POA: Diagnosis not present

## 2014-09-27 DIAGNOSIS — I10 Essential (primary) hypertension: Secondary | ICD-10-CM | POA: Diagnosis not present

## 2014-09-27 DIAGNOSIS — G8929 Other chronic pain: Secondary | ICD-10-CM | POA: Diagnosis not present

## 2014-09-27 DIAGNOSIS — M4806 Spinal stenosis, lumbar region: Secondary | ICD-10-CM | POA: Diagnosis not present

## 2014-09-29 ENCOUNTER — Other Ambulatory Visit: Payer: Medicare Other | Admitting: *Deleted

## 2014-09-29 NOTE — Patient Outreach (Signed)
Second Transition of care call. Daughter reports her mother had an uneventful weeekend. Her pain is controlled with the steroid dose pack and she did not need any tramadol over the weekend. She is eating fair, drinking a supplement. She is able to ambulate in the home. She slept in her own bed which was lovely.Home health has visited and will be working with her to improve her strength.  I encouraged her to call me with any problems. I told her I would call again next weekend.  Tracy Sharp [facility] [phone]

## 2014-09-30 ENCOUNTER — Other Ambulatory Visit: Payer: Self-pay | Admitting: *Deleted

## 2014-09-30 DIAGNOSIS — Z79899 Other long term (current) drug therapy: Secondary | ICD-10-CM | POA: Diagnosis not present

## 2014-09-30 DIAGNOSIS — M0600 Rheumatoid arthritis without rheumatoid factor, unspecified site: Secondary | ICD-10-CM | POA: Diagnosis not present

## 2014-09-30 DIAGNOSIS — M5136 Other intervertebral disc degeneration, lumbar region: Secondary | ICD-10-CM | POA: Diagnosis not present

## 2014-09-30 NOTE — Patient Outreach (Signed)
[facility]) Care Management  09/30/2014  ITZAYANA PARDY [birth year] [id number]   Phone call to patient's daughter in law [name], to follow up on questions regarding Medicaid and placement options for patient.  Voicemail message left for a return call.   [name] [facility] Care Management [phone]

## 2014-10-01 DIAGNOSIS — G8929 Other chronic pain: Secondary | ICD-10-CM | POA: Diagnosis not present

## 2014-10-01 DIAGNOSIS — M4806 Spinal stenosis, lumbar region: Secondary | ICD-10-CM | POA: Diagnosis not present

## 2014-10-01 DIAGNOSIS — M6281 Muscle weakness (generalized): Secondary | ICD-10-CM | POA: Diagnosis not present

## 2014-10-01 DIAGNOSIS — I10 Essential (primary) hypertension: Secondary | ICD-10-CM | POA: Diagnosis not present

## 2014-10-01 DIAGNOSIS — Z9181 History of falling: Secondary | ICD-10-CM | POA: Diagnosis not present

## 2014-10-01 DIAGNOSIS — M1990 Unspecified osteoarthritis, unspecified site: Secondary | ICD-10-CM | POA: Diagnosis not present

## 2014-10-02 ENCOUNTER — Ambulatory Visit: Payer: Self-pay | Admitting: *Deleted

## 2014-10-02 ENCOUNTER — Other Ambulatory Visit: Payer: Self-pay | Admitting: *Deleted

## 2014-10-02 DIAGNOSIS — M79605 Pain in left leg: Secondary | ICD-10-CM

## 2014-10-02 NOTE — Patient Outreach (Signed)
Transition of care (discharged 8/25, week 2):  Received a return phone call from [name] (pt's daughter in law, on [facility] consent form).  [name] reports pt has no pain now in left leg, not taking the Tramadol.  [name] states moving around, gets tired easy.  [name] reports [facility] RN services started, nurse came yesterday.  [name] reports Hospice/Palliative care evaluation was not done, did talk to SW about long term care for pt.  RN CM discussed f/u again next week telephonically as part of ongoing transition of care to which [name] requested calling daughter [name] (on [facility] consent form) as she will be on vacation.      As discussed with daughter in law [name], plan to f/u with  daughter [name] 9/8, check on pt's status.     [name].   [place] Care Management  [phone]

## 2014-10-02 NOTE — Patient Outreach (Signed)
Attempt made to contact pt's  daughter in law [place] (on [facility] consent form, HCPOA) as part of transition of care (week 3).   HIPPA compliant voice message left with contact number.   Will try again if no response.    [name].   [place] Care Management  [phone]

## 2014-10-03 ENCOUNTER — Other Ambulatory Visit: Payer: Self-pay | Admitting: *Deleted

## 2014-10-03 DIAGNOSIS — M1990 Unspecified osteoarthritis, unspecified site: Secondary | ICD-10-CM | POA: Diagnosis not present

## 2014-10-03 DIAGNOSIS — M6281 Muscle weakness (generalized): Secondary | ICD-10-CM | POA: Diagnosis not present

## 2014-10-03 DIAGNOSIS — G8929 Other chronic pain: Secondary | ICD-10-CM | POA: Diagnosis not present

## 2014-10-03 DIAGNOSIS — I10 Essential (primary) hypertension: Secondary | ICD-10-CM | POA: Diagnosis not present

## 2014-10-03 DIAGNOSIS — Z9181 History of falling: Secondary | ICD-10-CM | POA: Diagnosis not present

## 2014-10-03 DIAGNOSIS — M4806 Spinal stenosis, lumbar region: Secondary | ICD-10-CM | POA: Diagnosis not present

## 2014-10-03 NOTE — Patient Outreach (Addendum)
[facility]) Care Management  10/03/2014  Tracy Sharp [birth year] [id number]   Phone call to patient's daughter in law Tracy Sharp to follow up on any questions that she may have regarding facility care for patient.  Per Tracy Sharp, patient is receiving HH through [facility].  Patient has also been referred for a Hospice evaluation.  Phone call made to Hospice and Palliative care of [place], [phone] spoke to intake who states that they have not received the referral for a hospice evaluation.  Phone call made to [facility], [phone],  it was confirmed that Tracy Sharp, the Child psychotherapist is working on getting the evaluation completed.  Left message for a return call for an update.  Daughter is law also thinking about long term care and due to patient's income will have to apply for long term Medicaid to assist with the placement cost.  This social worker will mail the Medicaid application to daughter in law to review. Tracy Sharp states that she will be in vacation for the next 2 weeks, leaving on 10/06/14.  Patient's daughter law Tracy Sharp, [phone]  will be the contact for the next 2 weeks.   This Education officer, museum will follow up with daughter in law-Tracy Sharp when she returns from vacation to follow up on hospice referral and long term care plans.   Tracy Sharp [facility] Care Management [phone]

## 2014-10-07 ENCOUNTER — Other Ambulatory Visit: Payer: Self-pay | Admitting: *Deleted

## 2014-10-08 DIAGNOSIS — G8929 Other chronic pain: Secondary | ICD-10-CM | POA: Diagnosis not present

## 2014-10-08 DIAGNOSIS — I10 Essential (primary) hypertension: Secondary | ICD-10-CM | POA: Diagnosis not present

## 2014-10-08 DIAGNOSIS — M6281 Muscle weakness (generalized): Secondary | ICD-10-CM | POA: Diagnosis not present

## 2014-10-08 DIAGNOSIS — M1990 Unspecified osteoarthritis, unspecified site: Secondary | ICD-10-CM | POA: Diagnosis not present

## 2014-10-08 DIAGNOSIS — Z9181 History of falling: Secondary | ICD-10-CM | POA: Diagnosis not present

## 2014-10-08 DIAGNOSIS — M4806 Spinal stenosis, lumbar region: Secondary | ICD-10-CM | POA: Diagnosis not present

## 2014-10-09 DIAGNOSIS — Z9181 History of falling: Secondary | ICD-10-CM | POA: Diagnosis not present

## 2014-10-09 DIAGNOSIS — I10 Essential (primary) hypertension: Secondary | ICD-10-CM | POA: Diagnosis not present

## 2014-10-09 DIAGNOSIS — M6281 Muscle weakness (generalized): Secondary | ICD-10-CM | POA: Diagnosis not present

## 2014-10-09 DIAGNOSIS — M1990 Unspecified osteoarthritis, unspecified site: Secondary | ICD-10-CM | POA: Diagnosis not present

## 2014-10-09 DIAGNOSIS — M4806 Spinal stenosis, lumbar region: Secondary | ICD-10-CM | POA: Diagnosis not present

## 2014-10-09 DIAGNOSIS — G8929 Other chronic pain: Secondary | ICD-10-CM | POA: Diagnosis not present

## 2014-10-10 ENCOUNTER — Other Ambulatory Visit: Payer: Self-pay | Admitting: *Deleted

## 2014-10-10 DIAGNOSIS — M1990 Unspecified osteoarthritis, unspecified site: Secondary | ICD-10-CM | POA: Diagnosis not present

## 2014-10-10 DIAGNOSIS — I10 Essential (primary) hypertension: Secondary | ICD-10-CM | POA: Diagnosis not present

## 2014-10-10 DIAGNOSIS — M6281 Muscle weakness (generalized): Secondary | ICD-10-CM | POA: Diagnosis not present

## 2014-10-10 DIAGNOSIS — Z9181 History of falling: Secondary | ICD-10-CM | POA: Diagnosis not present

## 2014-10-10 DIAGNOSIS — M4806 Spinal stenosis, lumbar region: Secondary | ICD-10-CM | POA: Diagnosis not present

## 2014-10-10 DIAGNOSIS — G8929 Other chronic pain: Secondary | ICD-10-CM | POA: Diagnosis not present

## 2014-10-10 NOTE — Patient Outreach (Signed)
Transition of care (week 3, discharged 8/25):  Spoke with daughter [name], on [facility] consent form - caregiver daughter in law [name] on vacation.    Daughter states pt is doing good today, on 9/7  leg hurt some, took pain med.  Daughter states pt f/u with Primary Care MD 9/1.    RN CM discussed f/u again next week telephonically as part of ongoing transition of care to which will call [name] again, [name] daughter in law will still be on vacation.   [name].   [place] Care Management  [phone]

## 2014-10-13 ENCOUNTER — Other Ambulatory Visit: Payer: Self-pay | Admitting: *Deleted

## 2014-10-13 DIAGNOSIS — G8929 Other chronic pain: Secondary | ICD-10-CM | POA: Diagnosis not present

## 2014-10-13 DIAGNOSIS — I10 Essential (primary) hypertension: Secondary | ICD-10-CM | POA: Diagnosis not present

## 2014-10-13 DIAGNOSIS — M4806 Spinal stenosis, lumbar region: Secondary | ICD-10-CM | POA: Diagnosis not present

## 2014-10-13 DIAGNOSIS — M1990 Unspecified osteoarthritis, unspecified site: Secondary | ICD-10-CM | POA: Diagnosis not present

## 2014-10-13 DIAGNOSIS — M6281 Muscle weakness (generalized): Secondary | ICD-10-CM | POA: Diagnosis not present

## 2014-10-13 DIAGNOSIS — Z9181 History of falling: Secondary | ICD-10-CM | POA: Diagnosis not present

## 2014-10-13 NOTE — Patient Outreach (Signed)
[place] [facility]) Care Management  10/13/2014  SCHYLER BUTIKOFER [birth year] [id number]   Phone call to patient's daughter Aliviyah Malanga to follow up on the start of [place] and Hospice evaluation through [place].  Per [name], home health has not started and there has been no Hospice evaluation yet.   This social worker contacted [facility] and is awaiting a phone call back regarding status of home health and Hospice evaluation.   [name] [facility] Care Management [phone]

## 2014-10-14 ENCOUNTER — Other Ambulatory Visit: Payer: Self-pay | Admitting: *Deleted

## 2014-10-14 DIAGNOSIS — I10 Essential (primary) hypertension: Secondary | ICD-10-CM | POA: Diagnosis not present

## 2014-10-14 DIAGNOSIS — G8929 Other chronic pain: Secondary | ICD-10-CM | POA: Diagnosis not present

## 2014-10-14 DIAGNOSIS — M1990 Unspecified osteoarthritis, unspecified site: Secondary | ICD-10-CM | POA: Diagnosis not present

## 2014-10-14 DIAGNOSIS — M6281 Muscle weakness (generalized): Secondary | ICD-10-CM | POA: Diagnosis not present

## 2014-10-14 DIAGNOSIS — Z9181 History of falling: Secondary | ICD-10-CM | POA: Diagnosis not present

## 2014-10-14 DIAGNOSIS — M4806 Spinal stenosis, lumbar region: Secondary | ICD-10-CM | POA: Diagnosis not present

## 2014-10-14 NOTE — Patient Outreach (Signed)
[facility]) Care Management  10/14/2014  JOLLY CARLINI 1924/05/26 [id number]   Return phone call from [facility] who states that he has spoken with patient's daughter [name] and plans to go to patient's home on Thursday to evaluate her for hospice care. The home health nurse will be going to patient's home today.   This social worker will follow up with [facility] care on Thursday following the evaluation.   [name] [facility] Care Management [phone]

## 2014-10-15 DIAGNOSIS — M6281 Muscle weakness (generalized): Secondary | ICD-10-CM | POA: Diagnosis not present

## 2014-10-15 DIAGNOSIS — I10 Essential (primary) hypertension: Secondary | ICD-10-CM | POA: Diagnosis not present

## 2014-10-15 DIAGNOSIS — M4806 Spinal stenosis, lumbar region: Secondary | ICD-10-CM | POA: Diagnosis not present

## 2014-10-15 DIAGNOSIS — Z9181 History of falling: Secondary | ICD-10-CM | POA: Diagnosis not present

## 2014-10-15 DIAGNOSIS — G8929 Other chronic pain: Secondary | ICD-10-CM | POA: Diagnosis not present

## 2014-10-15 DIAGNOSIS — M1990 Unspecified osteoarthritis, unspecified site: Secondary | ICD-10-CM | POA: Diagnosis not present

## 2014-10-16 ENCOUNTER — Ambulatory Visit: Payer: Self-pay | Admitting: *Deleted

## 2014-10-16 DIAGNOSIS — M4806 Spinal stenosis, lumbar region: Secondary | ICD-10-CM | POA: Diagnosis not present

## 2014-10-16 DIAGNOSIS — M1990 Unspecified osteoarthritis, unspecified site: Secondary | ICD-10-CM | POA: Diagnosis not present

## 2014-10-16 DIAGNOSIS — M6281 Muscle weakness (generalized): Secondary | ICD-10-CM | POA: Diagnosis not present

## 2014-10-16 DIAGNOSIS — G8929 Other chronic pain: Secondary | ICD-10-CM | POA: Diagnosis not present

## 2014-10-16 DIAGNOSIS — Z9181 History of falling: Secondary | ICD-10-CM | POA: Diagnosis not present

## 2014-10-16 DIAGNOSIS — I10 Essential (primary) hypertension: Secondary | ICD-10-CM | POA: Diagnosis not present

## 2014-10-17 DIAGNOSIS — Z9181 History of falling: Secondary | ICD-10-CM | POA: Diagnosis not present

## 2014-10-17 DIAGNOSIS — G8929 Other chronic pain: Secondary | ICD-10-CM | POA: Diagnosis not present

## 2014-10-17 DIAGNOSIS — M6281 Muscle weakness (generalized): Secondary | ICD-10-CM | POA: Diagnosis not present

## 2014-10-17 DIAGNOSIS — I10 Essential (primary) hypertension: Secondary | ICD-10-CM | POA: Diagnosis not present

## 2014-10-17 DIAGNOSIS — M1990 Unspecified osteoarthritis, unspecified site: Secondary | ICD-10-CM | POA: Diagnosis not present

## 2014-10-17 DIAGNOSIS — M4806 Spinal stenosis, lumbar region: Secondary | ICD-10-CM | POA: Diagnosis not present

## 2014-10-20 DIAGNOSIS — M4806 Spinal stenosis, lumbar region: Secondary | ICD-10-CM | POA: Diagnosis not present

## 2014-10-20 DIAGNOSIS — M1990 Unspecified osteoarthritis, unspecified site: Secondary | ICD-10-CM | POA: Diagnosis not present

## 2014-10-20 DIAGNOSIS — G8929 Other chronic pain: Secondary | ICD-10-CM | POA: Diagnosis not present

## 2014-10-20 DIAGNOSIS — I10 Essential (primary) hypertension: Secondary | ICD-10-CM | POA: Diagnosis not present

## 2014-10-20 DIAGNOSIS — M6281 Muscle weakness (generalized): Secondary | ICD-10-CM | POA: Diagnosis not present

## 2014-10-20 DIAGNOSIS — Z9181 History of falling: Secondary | ICD-10-CM | POA: Diagnosis not present

## 2014-10-21 ENCOUNTER — Other Ambulatory Visit: Payer: Self-pay | Admitting: *Deleted

## 2014-10-21 DIAGNOSIS — M1990 Unspecified osteoarthritis, unspecified site: Secondary | ICD-10-CM | POA: Diagnosis not present

## 2014-10-21 DIAGNOSIS — Z9181 History of falling: Secondary | ICD-10-CM | POA: Diagnosis not present

## 2014-10-21 DIAGNOSIS — G8929 Other chronic pain: Secondary | ICD-10-CM | POA: Diagnosis not present

## 2014-10-21 DIAGNOSIS — M6281 Muscle weakness (generalized): Secondary | ICD-10-CM | POA: Diagnosis not present

## 2014-10-21 DIAGNOSIS — M4806 Spinal stenosis, lumbar region: Secondary | ICD-10-CM | POA: Diagnosis not present

## 2014-10-21 DIAGNOSIS — I10 Essential (primary) hypertension: Secondary | ICD-10-CM | POA: Diagnosis not present

## 2014-10-21 NOTE — Patient Outreach (Signed)
Attempt made to contact daughter in law [name] (preferred contact, on [facility] consent form), check on status of pt (final transition of care call).  HIPPA compliant voice message left with contact number. If no response, will try again.    [name].   [place] Care Management  [phone]

## 2014-10-22 DIAGNOSIS — M1990 Unspecified osteoarthritis, unspecified site: Secondary | ICD-10-CM | POA: Diagnosis not present

## 2014-10-22 DIAGNOSIS — M4806 Spinal stenosis, lumbar region: Secondary | ICD-10-CM | POA: Diagnosis not present

## 2014-10-22 DIAGNOSIS — Z9181 History of falling: Secondary | ICD-10-CM | POA: Diagnosis not present

## 2014-10-22 DIAGNOSIS — I10 Essential (primary) hypertension: Secondary | ICD-10-CM | POA: Diagnosis not present

## 2014-10-22 DIAGNOSIS — G8929 Other chronic pain: Secondary | ICD-10-CM | POA: Diagnosis not present

## 2014-10-22 DIAGNOSIS — M6281 Muscle weakness (generalized): Secondary | ICD-10-CM | POA: Diagnosis not present

## 2014-10-23 DIAGNOSIS — I868 Varicose veins of other specified sites: Secondary | ICD-10-CM | POA: Diagnosis not present

## 2014-10-23 DIAGNOSIS — R29818 Other symptoms and signs involving the nervous system: Secondary | ICD-10-CM | POA: Diagnosis not present

## 2014-10-23 DIAGNOSIS — R1032 Left lower quadrant pain: Secondary | ICD-10-CM | POA: Diagnosis not present

## 2014-10-23 DIAGNOSIS — M0600 Rheumatoid arthritis without rheumatoid factor, unspecified site: Secondary | ICD-10-CM | POA: Diagnosis not present

## 2014-10-24 ENCOUNTER — Other Ambulatory Visit: Payer: Self-pay | Admitting: *Deleted

## 2014-10-24 NOTE — Patient Outreach (Signed)
[facility]) Care Management  10/24/2014  Tracy Sharp 04-Jun-1924 [id number]   Phone call to patient's daughter in law [name] to follow up on information received that patient does not qualify for hospice services at this time and to explore long term options for patient.    [name] [facility] Care Management [phone]

## 2014-10-24 NOTE — Patient Outreach (Addendum)
[facility]) Care Management  10/24/2014  Tracy Sharp May 12, 1924 [id number]   Return phone call from Dawanna Grauberger stating  that [place] would be a good plan for patient.  Per Ms. Reeg, that was her original request, not Hospice.   This social worker will request Palliative Care order from patient's primary care doctor.   [name] [facility] Care Management [phone]

## 2014-10-24 NOTE — Patient Outreach (Signed)
[facility]) Care Management  10/24/2014  Tracy Sharp 1924-06-21 [id number]   [facility] to confirm that patient is receiving Hospice status.  Per [name], patient is currently receiving home health service, is stable and adjusting well.  At this time patient in no a candidate for hospice.   Plan: This social worker will contact daughter in law to discuss long term plan.    [name] [facility] Care Management [phone]

## 2014-10-25 DIAGNOSIS — G8929 Other chronic pain: Secondary | ICD-10-CM | POA: Diagnosis not present

## 2014-10-25 DIAGNOSIS — M6281 Muscle weakness (generalized): Secondary | ICD-10-CM | POA: Diagnosis not present

## 2014-10-25 DIAGNOSIS — M4806 Spinal stenosis, lumbar region: Secondary | ICD-10-CM | POA: Diagnosis not present

## 2014-10-25 DIAGNOSIS — M1990 Unspecified osteoarthritis, unspecified site: Secondary | ICD-10-CM | POA: Diagnosis not present

## 2014-10-25 DIAGNOSIS — Z9181 History of falling: Secondary | ICD-10-CM | POA: Diagnosis not present

## 2014-10-25 DIAGNOSIS — I10 Essential (primary) hypertension: Secondary | ICD-10-CM | POA: Diagnosis not present

## 2014-10-28 ENCOUNTER — Other Ambulatory Visit: Payer: Self-pay | Admitting: *Deleted

## 2014-10-28 ENCOUNTER — Other Ambulatory Visit: Payer: Self-pay | Admitting: Neurology

## 2014-10-28 ENCOUNTER — Ambulatory Visit (INDEPENDENT_AMBULATORY_CARE_PROVIDER_SITE_OTHER): Payer: Medicare Other | Admitting: Neurology

## 2014-10-28 ENCOUNTER — Encounter: Payer: Self-pay | Admitting: Neurology

## 2014-10-28 ENCOUNTER — Ambulatory Visit: Payer: Medicare Other | Admitting: Neurology

## 2014-10-28 ENCOUNTER — Ambulatory Visit
Admission: RE | Admit: 2014-10-28 | Discharge: 2014-10-28 | Disposition: A | Discharge status: Home / Self Care | Payer: Medicare Other | Source: Ambulatory Visit | Attending: Neurology | Admitting: Neurology

## 2014-10-28 VITALS — BP 132/76 | HR 68 | Ht 66.0 in | Wt 183.5 lb

## 2014-10-28 DIAGNOSIS — M25559 Pain in unspecified hip: Secondary | ICD-10-CM

## 2014-10-28 DIAGNOSIS — M79606 Pain in leg, unspecified: Secondary | ICD-10-CM

## 2014-10-28 DIAGNOSIS — M160 Bilateral primary osteoarthritis of hip: Secondary | ICD-10-CM | POA: Diagnosis not present

## 2014-10-28 NOTE — Patient Outreach (Signed)
Attempt made to f/u with [name] (pt's daughter in law- on [facility] consent form, caregiver), check on pt's status, complete transition of care (pt discharged 8/25- short inpatient stay).   HIPPA compliant voice message left with contact number.   If no response, will try again.     [name].   [place] Care Management  [phone]
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
## 2014-10-29 ENCOUNTER — Telehealth: Payer: Self-pay | Admitting: Neurology

## 2014-10-29 DIAGNOSIS — G8929 Other chronic pain: Secondary | ICD-10-CM | POA: Diagnosis not present

## 2014-10-29 DIAGNOSIS — Z9181 History of falling: Secondary | ICD-10-CM | POA: Diagnosis not present

## 2014-10-29 DIAGNOSIS — M6281 Muscle weakness (generalized): Secondary | ICD-10-CM | POA: Diagnosis not present

## 2014-10-29 DIAGNOSIS — I10 Essential (primary) hypertension: Secondary | ICD-10-CM | POA: Diagnosis not present

## 2014-10-29 DIAGNOSIS — M1990 Unspecified osteoarthritis, unspecified site: Secondary | ICD-10-CM | POA: Diagnosis not present

## 2014-10-29 DIAGNOSIS — M4806 Spinal stenosis, lumbar region: Secondary | ICD-10-CM | POA: Diagnosis not present

## 2014-10-29 NOTE — Telephone Encounter (Signed)
Michele: Please call her daughter-in-law, x-ray of both hip showed mild degenerative changes, there was no dislocation or fracture

## 2014-10-29 NOTE — Telephone Encounter (Signed)
Spoke to Tracy Sharp had PT and OT ordered after her second hospital visit with [facility] - she said the care/appts have been very inconsistent and she has not seen any real benefit because of this - they are agreeable to a referral to another home health agency - specifically we talked about using [facility].

## 2014-10-29 NOTE — Telephone Encounter (Signed)
Spoke to [place] - she is aware of the results.

## 2014-10-30 ENCOUNTER — Other Ambulatory Visit: Payer: Self-pay | Admitting: *Deleted

## 2014-10-30 DIAGNOSIS — M1990 Unspecified osteoarthritis, unspecified site: Secondary | ICD-10-CM | POA: Diagnosis not present

## 2014-10-30 DIAGNOSIS — M4806 Spinal stenosis, lumbar region: Secondary | ICD-10-CM | POA: Diagnosis not present

## 2014-10-30 DIAGNOSIS — M545 Low back pain: Secondary | ICD-10-CM

## 2014-10-30 DIAGNOSIS — M25559 Pain in unspecified hip: Secondary | ICD-10-CM

## 2014-10-30 DIAGNOSIS — R531 Weakness: Secondary | ICD-10-CM

## 2014-10-30 DIAGNOSIS — G8929 Other chronic pain: Secondary | ICD-10-CM | POA: Diagnosis not present

## 2014-10-30 DIAGNOSIS — M6281 Muscle weakness (generalized): Secondary | ICD-10-CM | POA: Diagnosis not present

## 2014-10-30 DIAGNOSIS — Z9181 History of falling: Secondary | ICD-10-CM | POA: Diagnosis not present

## 2014-10-30 DIAGNOSIS — R269 Unspecified abnormalities of gait and mobility: Secondary | ICD-10-CM

## 2014-10-30 DIAGNOSIS — I10 Essential (primary) hypertension: Secondary | ICD-10-CM | POA: Diagnosis not present

## 2014-10-30 NOTE — Telephone Encounter (Signed)
PT orders placed for [facility].

## 2014-10-31 DIAGNOSIS — Z9181 History of falling: Secondary | ICD-10-CM | POA: Diagnosis not present

## 2014-10-31 DIAGNOSIS — M4806 Spinal stenosis, lumbar region: Secondary | ICD-10-CM | POA: Diagnosis not present

## 2014-10-31 DIAGNOSIS — G8929 Other chronic pain: Secondary | ICD-10-CM | POA: Diagnosis not present

## 2014-10-31 DIAGNOSIS — I10 Essential (primary) hypertension: Secondary | ICD-10-CM | POA: Diagnosis not present

## 2014-10-31 DIAGNOSIS — M1990 Unspecified osteoarthritis, unspecified site: Secondary | ICD-10-CM | POA: Diagnosis not present

## 2014-10-31 DIAGNOSIS — M6281 Muscle weakness (generalized): Secondary | ICD-10-CM | POA: Diagnosis not present

## 2014-11-03 DIAGNOSIS — M6281 Muscle weakness (generalized): Secondary | ICD-10-CM | POA: Diagnosis not present

## 2014-11-03 DIAGNOSIS — Z9181 History of falling: Secondary | ICD-10-CM | POA: Diagnosis not present

## 2014-11-03 DIAGNOSIS — M1990 Unspecified osteoarthritis, unspecified site: Secondary | ICD-10-CM | POA: Diagnosis not present

## 2014-11-03 DIAGNOSIS — G8929 Other chronic pain: Secondary | ICD-10-CM | POA: Diagnosis not present

## 2014-11-03 DIAGNOSIS — M4806 Spinal stenosis, lumbar region: Secondary | ICD-10-CM | POA: Diagnosis not present

## 2014-11-03 DIAGNOSIS — I10 Essential (primary) hypertension: Secondary | ICD-10-CM | POA: Diagnosis not present

## 2014-11-04 ENCOUNTER — Other Ambulatory Visit: Payer: Self-pay | Admitting: *Deleted

## 2014-11-04 NOTE — Patient Outreach (Signed)
[facility]) Care Management  11/04/2014  Tracy Sharp 04-Apr-1924 [id number]   Phone call to [facility] (Dr. Harlan Stains)  to request Palliative Care order for patient to be faxed to hospice and [place] (fax) [phone] . Spoke with [place].   [name] [facility] Care Management [phone]

## 2014-11-05 DIAGNOSIS — M1990 Unspecified osteoarthritis, unspecified site: Secondary | ICD-10-CM | POA: Diagnosis not present

## 2014-11-05 DIAGNOSIS — M4806 Spinal stenosis, lumbar region: Secondary | ICD-10-CM | POA: Diagnosis not present

## 2014-11-05 DIAGNOSIS — Z9181 History of falling: Secondary | ICD-10-CM | POA: Diagnosis not present

## 2014-11-05 DIAGNOSIS — I10 Essential (primary) hypertension: Secondary | ICD-10-CM | POA: Diagnosis not present

## 2014-11-05 DIAGNOSIS — G8929 Other chronic pain: Secondary | ICD-10-CM | POA: Diagnosis not present

## 2014-11-05 DIAGNOSIS — M6281 Muscle weakness (generalized): Secondary | ICD-10-CM | POA: Diagnosis not present

## 2014-11-06 ENCOUNTER — Ambulatory Visit: Payer: Medicare Other | Admitting: Neurology

## 2014-11-06 DIAGNOSIS — G8929 Other chronic pain: Secondary | ICD-10-CM | POA: Diagnosis not present

## 2014-11-06 DIAGNOSIS — I10 Essential (primary) hypertension: Secondary | ICD-10-CM | POA: Diagnosis not present

## 2014-11-06 DIAGNOSIS — Z9181 History of falling: Secondary | ICD-10-CM | POA: Diagnosis not present

## 2014-11-06 DIAGNOSIS — M6281 Muscle weakness (generalized): Secondary | ICD-10-CM | POA: Diagnosis not present

## 2014-11-06 DIAGNOSIS — M1990 Unspecified osteoarthritis, unspecified site: Secondary | ICD-10-CM | POA: Diagnosis not present

## 2014-11-06 DIAGNOSIS — M4806 Spinal stenosis, lumbar region: Secondary | ICD-10-CM | POA: Diagnosis not present

## 2014-11-07 DIAGNOSIS — M6281 Muscle weakness (generalized): Secondary | ICD-10-CM | POA: Diagnosis not present

## 2014-11-07 DIAGNOSIS — M1990 Unspecified osteoarthritis, unspecified site: Secondary | ICD-10-CM | POA: Diagnosis not present

## 2014-11-07 DIAGNOSIS — M4806 Spinal stenosis, lumbar region: Secondary | ICD-10-CM | POA: Diagnosis not present

## 2014-11-07 DIAGNOSIS — I10 Essential (primary) hypertension: Secondary | ICD-10-CM | POA: Diagnosis not present

## 2014-11-07 DIAGNOSIS — G8929 Other chronic pain: Secondary | ICD-10-CM | POA: Diagnosis not present

## 2014-11-07 DIAGNOSIS — Z9181 History of falling: Secondary | ICD-10-CM | POA: Diagnosis not present

## 2014-11-11 DIAGNOSIS — M4806 Spinal stenosis, lumbar region: Secondary | ICD-10-CM | POA: Diagnosis not present

## 2014-11-11 DIAGNOSIS — M0609 Rheumatoid arthritis without rheumatoid factor, multiple sites: Secondary | ICD-10-CM | POA: Diagnosis not present

## 2014-11-11 DIAGNOSIS — I10 Essential (primary) hypertension: Secondary | ICD-10-CM | POA: Diagnosis not present

## 2014-11-11 DIAGNOSIS — G8929 Other chronic pain: Secondary | ICD-10-CM | POA: Diagnosis not present

## 2014-11-11 DIAGNOSIS — M6281 Muscle weakness (generalized): Secondary | ICD-10-CM | POA: Diagnosis not present

## 2014-11-11 DIAGNOSIS — M2550 Pain in unspecified joint: Secondary | ICD-10-CM | POA: Diagnosis not present

## 2014-11-11 DIAGNOSIS — Z9181 History of falling: Secondary | ICD-10-CM | POA: Diagnosis not present

## 2014-11-11 DIAGNOSIS — M150 Primary generalized (osteo)arthritis: Secondary | ICD-10-CM | POA: Diagnosis not present

## 2014-11-11 DIAGNOSIS — M1990 Unspecified osteoarthritis, unspecified site: Secondary | ICD-10-CM | POA: Diagnosis not present

## 2014-11-13 ENCOUNTER — Other Ambulatory Visit: Payer: Self-pay | Admitting: *Deleted

## 2014-11-13 DIAGNOSIS — M6281 Muscle weakness (generalized): Secondary | ICD-10-CM | POA: Diagnosis not present

## 2014-11-13 DIAGNOSIS — G8929 Other chronic pain: Secondary | ICD-10-CM | POA: Diagnosis not present

## 2014-11-13 DIAGNOSIS — Z9181 History of falling: Secondary | ICD-10-CM | POA: Diagnosis not present

## 2014-11-13 DIAGNOSIS — I10 Essential (primary) hypertension: Secondary | ICD-10-CM | POA: Diagnosis not present

## 2014-11-13 DIAGNOSIS — M4806 Spinal stenosis, lumbar region: Secondary | ICD-10-CM | POA: Diagnosis not present

## 2014-11-13 DIAGNOSIS — M1990 Unspecified osteoarthritis, unspecified site: Secondary | ICD-10-CM | POA: Diagnosis not present

## 2014-11-13 NOTE — Patient Outreach (Signed)
[facility] [facility]) Care Management  11/13/2014  Tracy Sharp 1924/02/02 [id number]   Phone call to [facility] to follow up on order for Palliative care. Left message with the front desk for a return call.   [name] [facility] Care Management [phone]

## 2014-11-14 ENCOUNTER — Other Ambulatory Visit: Payer: Self-pay | Admitting: *Deleted

## 2014-11-14 DIAGNOSIS — M6281 Muscle weakness (generalized): Secondary | ICD-10-CM | POA: Diagnosis not present

## 2014-11-14 DIAGNOSIS — Z9181 History of falling: Secondary | ICD-10-CM | POA: Diagnosis not present

## 2014-11-14 DIAGNOSIS — M4806 Spinal stenosis, lumbar region: Secondary | ICD-10-CM | POA: Diagnosis not present

## 2014-11-14 DIAGNOSIS — G8929 Other chronic pain: Secondary | ICD-10-CM | POA: Diagnosis not present

## 2014-11-14 DIAGNOSIS — I10 Essential (primary) hypertension: Secondary | ICD-10-CM | POA: Diagnosis not present

## 2014-11-14 DIAGNOSIS — M1990 Unspecified osteoarthritis, unspecified site: Secondary | ICD-10-CM | POA: Diagnosis not present

## 2014-11-14 NOTE — Patient Outreach (Addendum)
[facility] Care Management  11/14/2014  Tracy Sharp [birth year] [id number]   Return phone call from [place] at [place] stating that Dr. Dema Severin agrees to send Palliative Care order to [facility] and [place].  Fax number given [phone].    [name] [facility] Care Management [phone]

## 2014-11-17 ENCOUNTER — Encounter: Payer: Self-pay | Admitting: *Deleted

## 2014-11-17 ENCOUNTER — Other Ambulatory Visit: Payer: Self-pay | Admitting: *Deleted

## 2014-11-17 NOTE — Patient Outreach (Signed)
[facility]) Care Management  11/17/2014  Tracy Sharp Jun 07, 1924 [id number]   Phone call to Hosp[facility] and [place] to follow up on Palliative Care consult.  Per Amy and Myriam Jacobson, no referral received.  However this Education officer, museum did speak with patient's provider's office who stated that the order was faxed.  Per [place]  she will follow up with order.   This Education officer, museum will follow up with patient regarding status in approximately 1 week.    Tracy Sharp [facility] Care Management [phone]

## 2014-11-17 NOTE — Patient Outreach (Signed)
Follow up phone call:  Spoke with daughter in law [name] (on [facility] consent form), check on pt's status.  [name] reports pt has not had any recent  episodes (extreme swelling, leg pain), pt told her today not hurting.  [name] reports pt has days ankles swell a little, watching her salt, doing more walking- HH PT.  [name] states [facility] PT saw her last week, trying to reinstate her.  [name] states [facility] RN coming once a week.  [name] reports no one let her know yet about Palliative Care yet to which  RN CM informed [name] (view in Epic), [facility] SW [place] f/u with Dr. Orest Dikes office about the matter, suggested she f/u with [facility] SW.   Discussed with [name] f/u with RN CM services to which [name] reports pt is managing, no community nurse case management needs at this time, pt needs Companion care.     Plan to inform [place] of plan to discharge, no community nurse case management needs at this time. Plan to also inform Dr. Dema Severin of plan to discharge, SW to remain.     [name].   [place] Care Management  [phone]

## 2014-11-18 DIAGNOSIS — Z9181 History of falling: Secondary | ICD-10-CM | POA: Diagnosis not present

## 2014-11-18 DIAGNOSIS — G8929 Other chronic pain: Secondary | ICD-10-CM | POA: Diagnosis not present

## 2014-11-18 DIAGNOSIS — M6281 Muscle weakness (generalized): Secondary | ICD-10-CM | POA: Diagnosis not present

## 2014-11-18 DIAGNOSIS — M1990 Unspecified osteoarthritis, unspecified site: Secondary | ICD-10-CM | POA: Diagnosis not present

## 2014-11-18 DIAGNOSIS — I10 Essential (primary) hypertension: Secondary | ICD-10-CM | POA: Diagnosis not present

## 2014-11-18 DIAGNOSIS — M4806 Spinal stenosis, lumbar region: Secondary | ICD-10-CM | POA: Diagnosis not present

## 2014-11-20 DIAGNOSIS — R627 Adult failure to thrive: Secondary | ICD-10-CM | POA: Diagnosis not present

## 2014-11-24 ENCOUNTER — Other Ambulatory Visit: Payer: Self-pay | Admitting: *Deleted

## 2014-11-24 NOTE — Patient Outreach (Signed)
[facility]) Care Management  11/24/2014  Tracy Sharp 01-Oct-1924 [id number]   Phone call to patient's daughter in law Tracy Sharp to follow up on Palliative care consult.  Per Tracy Sharp form [facility]osp[facility] and [facility] Care of Tracy Sharp came out to complete an assessment on 11/20/14.  Per Tracy Sharp, she is not sure of the outcome.  Plan: This social worker will follow up with Hospice and [name] to follow up on Palliative care consult.   Tracy Sharp [facility] Care Management [phone]

## 2014-12-02 DIAGNOSIS — M5136 Other intervertebral disc degeneration, lumbar region: Secondary | ICD-10-CM | POA: Diagnosis not present

## 2014-12-02 DIAGNOSIS — M1990 Unspecified osteoarthritis, unspecified site: Secondary | ICD-10-CM | POA: Diagnosis not present

## 2014-12-02 DIAGNOSIS — M0600 Rheumatoid arthritis without rheumatoid factor, unspecified site: Secondary | ICD-10-CM | POA: Diagnosis not present

## 2014-12-02 DIAGNOSIS — Z23 Encounter for immunization: Secondary | ICD-10-CM | POA: Diagnosis not present

## 2014-12-02 DIAGNOSIS — F419 Anxiety disorder, unspecified: Secondary | ICD-10-CM | POA: Diagnosis not present

## 2014-12-02 DIAGNOSIS — F324 Major depressive disorder, single episode, in partial remission: Secondary | ICD-10-CM | POA: Diagnosis not present

## 2014-12-17 ENCOUNTER — Other Ambulatory Visit: Payer: Self-pay | Admitting: *Deleted

## 2014-12-17 NOTE — Patient Outreach (Signed)
[facility]) Care Management  12/17/2014  Tracy Sharp 15-Oct-1924 [id number]   Phone call to Hospice and [place].  It was confirmed that patient has been admitted to Palliative care on 11/17/14.  Billey Chang, NP completed assessment. Patient also receiving social work services..   Phone call to patient's daughter in law [name] confirming the above and informing her that her case would be closed to [facility] services at this time.   [name] [facility] Care Management [phone]

## 2014-12-18 ENCOUNTER — Other Ambulatory Visit: Payer: Self-pay | Admitting: *Deleted

## 2015-01-06 DIAGNOSIS — M5030 Other cervical disc degeneration, unspecified cervical region: Secondary | ICD-10-CM | POA: Diagnosis not present

## 2015-01-06 DIAGNOSIS — M0600 Rheumatoid arthritis without rheumatoid factor, unspecified site: Secondary | ICD-10-CM | POA: Diagnosis not present

## 2015-01-06 DIAGNOSIS — F324 Major depressive disorder, single episode, in partial remission: Secondary | ICD-10-CM | POA: Diagnosis not present

## 2015-01-08 DIAGNOSIS — M79671 Pain in right foot: Secondary | ICD-10-CM | POA: Diagnosis not present

## 2015-01-08 DIAGNOSIS — M79672 Pain in left foot: Secondary | ICD-10-CM | POA: Diagnosis not present

## 2015-01-14 ENCOUNTER — Emergency Department (HOSPITAL_COMMUNITY)
Admission: EM | Admit: 2015-01-14 | Discharge: 2015-01-14 | Disposition: A | Discharge status: Home / Self Care | Payer: Medicare Other | Attending: Emergency Medicine | Admitting: Emergency Medicine

## 2015-01-14 ENCOUNTER — Encounter (HOSPITAL_COMMUNITY): Payer: Self-pay

## 2015-01-14 ENCOUNTER — Emergency Department (HOSPITAL_COMMUNITY): Payer: Medicare Other

## 2015-01-14 DIAGNOSIS — M25571 Pain in right ankle and joints of right foot: Secondary | ICD-10-CM | POA: Diagnosis not present

## 2015-01-14 DIAGNOSIS — Z96653 Presence of artificial knee joint, bilateral: Secondary | ICD-10-CM | POA: Diagnosis not present

## 2015-01-14 DIAGNOSIS — Y998 Other external cause status: Secondary | ICD-10-CM | POA: Insufficient documentation

## 2015-01-14 DIAGNOSIS — Z974 Presence of external hearing-aid: Secondary | ICD-10-CM | POA: Diagnosis not present

## 2015-01-14 DIAGNOSIS — Z8719 Personal history of other diseases of the digestive system: Secondary | ICD-10-CM | POA: Diagnosis not present

## 2015-01-14 DIAGNOSIS — M1990 Unspecified osteoarthritis, unspecified site: Secondary | ICD-10-CM | POA: Diagnosis not present

## 2015-01-14 DIAGNOSIS — W1839XA Other fall on same level, initial encounter: Secondary | ICD-10-CM | POA: Insufficient documentation

## 2015-01-14 DIAGNOSIS — Z7982 Long term (current) use of aspirin: Secondary | ICD-10-CM | POA: Insufficient documentation

## 2015-01-14 DIAGNOSIS — F419 Anxiety disorder, unspecified: Secondary | ICD-10-CM | POA: Insufficient documentation

## 2015-01-14 DIAGNOSIS — Y9389 Activity, other specified: Secondary | ICD-10-CM | POA: Diagnosis not present

## 2015-01-14 DIAGNOSIS — S8991XA Unspecified injury of right lower leg, initial encounter: Secondary | ICD-10-CM | POA: Insufficient documentation

## 2015-01-14 DIAGNOSIS — E663 Overweight: Secondary | ICD-10-CM | POA: Diagnosis not present

## 2015-01-14 DIAGNOSIS — F329 Major depressive disorder, single episode, unspecified: Secondary | ICD-10-CM | POA: Insufficient documentation

## 2015-01-14 DIAGNOSIS — I10 Essential (primary) hypertension: Secondary | ICD-10-CM | POA: Diagnosis not present

## 2015-01-14 DIAGNOSIS — Y9289 Other specified places as the place of occurrence of the external cause: Secondary | ICD-10-CM | POA: Insufficient documentation

## 2015-01-14 DIAGNOSIS — Z973 Presence of spectacles and contact lenses: Secondary | ICD-10-CM | POA: Diagnosis not present

## 2015-01-14 DIAGNOSIS — Z79899 Other long term (current) drug therapy: Secondary | ICD-10-CM | POA: Diagnosis not present

## 2015-01-14 DIAGNOSIS — R030 Elevated blood-pressure reading, without diagnosis of hypertension: Secondary | ICD-10-CM | POA: Diagnosis not present

## 2015-01-14 DIAGNOSIS — S99921A Unspecified injury of right foot, initial encounter: Secondary | ICD-10-CM | POA: Diagnosis not present

## 2015-01-14 DIAGNOSIS — M79606 Pain in leg, unspecified: Secondary | ICD-10-CM | POA: Diagnosis not present

## 2015-01-14 DIAGNOSIS — M79671 Pain in right foot: Secondary | ICD-10-CM

## 2015-01-14 DIAGNOSIS — W19XXXA Unspecified fall, initial encounter: Secondary | ICD-10-CM

## 2015-01-14 DIAGNOSIS — I2510 Atherosclerotic heart disease of native coronary artery without angina pectoris: Secondary | ICD-10-CM | POA: Diagnosis not present

## 2015-01-14 DIAGNOSIS — S99911A Unspecified injury of right ankle, initial encounter: Secondary | ICD-10-CM | POA: Diagnosis not present

## 2015-01-14 DIAGNOSIS — M25561 Pain in right knee: Secondary | ICD-10-CM | POA: Diagnosis not present

## 2015-01-14 MED ORDER — NAPROXEN 250 MG PO TABS
375.0000 mg | ORAL_TABLET | Freq: Once | ORAL | Status: AC
  Administered 2015-01-14: 375 mg via ORAL
  Filled 2015-01-14: qty 2

## 2015-01-14 MED ORDER — ACETAMINOPHEN 325 MG PO TABS: 650.0000 mg | ORAL_TABLET | Freq: Once | ORAL | Status: DC

## 2015-01-14 NOTE — ED Provider Notes (Signed)
I saw and evaluated the patient, reviewed the resident's note and I agree with the findings and plan.  Legs got weak and fell, twisting ankle and hitting knee. No syncope or near syncope. No head trauma or neck trauma. Slight ttp to right ankle that also has some ecchymosis. No other obvious injuries. Plan for xr's and appropriate dispositon.   Merrily Pew, MD 01/14/15 [phone]
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
## 2015-01-14 NOTE — ED Notes (Addendum)
Per [facility], pt was on the commode tonight and was trying to get up with her walker and slipped landing on the floor on her buttocks. Has some swelling to her right ankle and reports pain to her right knee as well. No LOC.

## 2015-01-22 DIAGNOSIS — M79643 Pain in unspecified hand: Secondary | ICD-10-CM | POA: Diagnosis not present

## 2015-01-22 DIAGNOSIS — R296 Repeated falls: Secondary | ICD-10-CM | POA: Diagnosis not present

## 2015-01-22 DIAGNOSIS — M25562 Pain in left knee: Secondary | ICD-10-CM | POA: Diagnosis not present

## 2015-01-22 DIAGNOSIS — M25561 Pain in right knee: Secondary | ICD-10-CM | POA: Diagnosis not present

## 2015-02-05 DIAGNOSIS — M1612 Unilateral primary osteoarthritis, left hip: Secondary | ICD-10-CM | POA: Diagnosis not present

## 2015-02-05 DIAGNOSIS — M25532 Pain in left wrist: Secondary | ICD-10-CM | POA: Diagnosis not present

## 2015-02-05 DIAGNOSIS — M25561 Pain in right knee: Secondary | ICD-10-CM | POA: Diagnosis not present

## 2015-02-11 DIAGNOSIS — M0609 Rheumatoid arthritis without rheumatoid factor, multiple sites: Secondary | ICD-10-CM | POA: Diagnosis not present

## 2015-02-11 DIAGNOSIS — M150 Primary generalized (osteo)arthritis: Secondary | ICD-10-CM | POA: Diagnosis not present

## 2015-02-11 DIAGNOSIS — M2550 Pain in unspecified joint: Secondary | ICD-10-CM | POA: Diagnosis not present

## 2015-02-11 DIAGNOSIS — Z79899 Other long term (current) drug therapy: Secondary | ICD-10-CM | POA: Diagnosis not present

## 2015-02-17 DIAGNOSIS — M0600 Rheumatoid arthritis without rheumatoid factor, unspecified site: Secondary | ICD-10-CM | POA: Diagnosis not present

## 2015-02-17 DIAGNOSIS — F324 Major depressive disorder, single episode, in partial remission: Secondary | ICD-10-CM | POA: Diagnosis not present

## 2015-02-17 DIAGNOSIS — R202 Paresthesia of skin: Secondary | ICD-10-CM | POA: Diagnosis not present

## 2015-02-17 DIAGNOSIS — I7300 Raynaud's syndrome without gangrene: Secondary | ICD-10-CM | POA: Diagnosis not present

## 2015-02-17 DIAGNOSIS — M179 Osteoarthritis of knee, unspecified: Secondary | ICD-10-CM | POA: Diagnosis not present

## 2015-02-19 DIAGNOSIS — F320 Major depressive disorder, single episode, mild: Secondary | ICD-10-CM | POA: Diagnosis not present

## 2015-03-10 DIAGNOSIS — R52 Pain, unspecified: Secondary | ICD-10-CM

## 2015-03-12 DIAGNOSIS — M0609 Rheumatoid arthritis without rheumatoid factor, multiple sites: Secondary | ICD-10-CM | POA: Diagnosis not present

## 2015-03-12 DIAGNOSIS — Z79899 Other long term (current) drug therapy: Secondary | ICD-10-CM | POA: Diagnosis not present

## 2015-03-17 DIAGNOSIS — R51 Headache: Secondary | ICD-10-CM | POA: Diagnosis not present

## 2015-03-17 DIAGNOSIS — H01001 Unspecified blepharitis right upper eyelid: Secondary | ICD-10-CM | POA: Diagnosis not present

## 2015-03-17 DIAGNOSIS — H3562 Retinal hemorrhage, left eye: Secondary | ICD-10-CM | POA: Diagnosis not present

## 2015-03-17 DIAGNOSIS — H01004 Unspecified blepharitis left upper eyelid: Secondary | ICD-10-CM | POA: Diagnosis not present

## 2015-03-17 DIAGNOSIS — H539 Unspecified visual disturbance: Secondary | ICD-10-CM | POA: Diagnosis not present

## 2015-03-17 DIAGNOSIS — H5310 Unspecified subjective visual disturbances: Secondary | ICD-10-CM | POA: Diagnosis not present

## 2015-03-19 DIAGNOSIS — M150 Primary generalized (osteo)arthritis: Secondary | ICD-10-CM | POA: Diagnosis not present

## 2015-03-19 DIAGNOSIS — R51 Headache: Secondary | ICD-10-CM | POA: Diagnosis not present

## 2015-03-19 DIAGNOSIS — M2550 Pain in unspecified joint: Secondary | ICD-10-CM | POA: Diagnosis not present

## 2015-03-19 DIAGNOSIS — M79641 Pain in right hand: Secondary | ICD-10-CM | POA: Diagnosis not present

## 2015-03-19 DIAGNOSIS — Z79899 Other long term (current) drug therapy: Secondary | ICD-10-CM | POA: Diagnosis not present

## 2015-03-19 DIAGNOSIS — M0609 Rheumatoid arthritis without rheumatoid factor, multiple sites: Secondary | ICD-10-CM | POA: Diagnosis not present

## 2015-03-20 DIAGNOSIS — H903 Sensorineural hearing loss, bilateral: Secondary | ICD-10-CM | POA: Diagnosis not present

## 2015-03-26 DIAGNOSIS — H3562 Retinal hemorrhage, left eye: Secondary | ICD-10-CM | POA: Diagnosis not present

## 2015-03-26 DIAGNOSIS — H353221 Exudative age-related macular degeneration, left eye, with active choroidal neovascularization: Secondary | ICD-10-CM | POA: Diagnosis not present

## 2015-04-07 DIAGNOSIS — R202 Paresthesia of skin: Secondary | ICD-10-CM | POA: Diagnosis not present

## 2015-04-07 DIAGNOSIS — M5030 Other cervical disc degeneration, unspecified cervical region: Secondary | ICD-10-CM | POA: Diagnosis not present

## 2015-04-07 DIAGNOSIS — M0600 Rheumatoid arthritis without rheumatoid factor, unspecified site: Secondary | ICD-10-CM | POA: Diagnosis not present

## 2015-04-07 DIAGNOSIS — M5136 Other intervertebral disc degeneration, lumbar region: Secondary | ICD-10-CM | POA: Diagnosis not present

## 2015-04-07 DIAGNOSIS — F324 Major depressive disorder, single episode, in partial remission: Secondary | ICD-10-CM | POA: Diagnosis not present

## 2015-04-07 DIAGNOSIS — L84 Corns and callosities: Secondary | ICD-10-CM | POA: Diagnosis not present

## 2015-04-08 DIAGNOSIS — F320 Major depressive disorder, single episode, mild: Secondary | ICD-10-CM | POA: Diagnosis not present

## 2015-04-09 DIAGNOSIS — F419 Anxiety disorder, unspecified: Secondary | ICD-10-CM | POA: Diagnosis not present

## 2015-04-09 DIAGNOSIS — F320 Major depressive disorder, single episode, mild: Secondary | ICD-10-CM | POA: Diagnosis not present

## 2015-04-10 DIAGNOSIS — Z9181 History of falling: Secondary | ICD-10-CM | POA: Diagnosis not present

## 2015-04-10 DIAGNOSIS — Z89422 Acquired absence of other left toe(s): Secondary | ICD-10-CM | POA: Diagnosis not present

## 2015-04-10 DIAGNOSIS — M5030 Other cervical disc degeneration, unspecified cervical region: Secondary | ICD-10-CM | POA: Diagnosis not present

## 2015-04-10 DIAGNOSIS — R202 Paresthesia of skin: Secondary | ICD-10-CM | POA: Diagnosis not present

## 2015-04-10 DIAGNOSIS — M5136 Other intervertebral disc degeneration, lumbar region: Secondary | ICD-10-CM | POA: Diagnosis not present

## 2015-04-10 DIAGNOSIS — M0600 Rheumatoid arthritis without rheumatoid factor, unspecified site: Secondary | ICD-10-CM | POA: Diagnosis not present

## 2015-04-10 DIAGNOSIS — N3281 Overactive bladder: Secondary | ICD-10-CM | POA: Diagnosis not present

## 2015-04-10 DIAGNOSIS — F419 Anxiety disorder, unspecified: Secondary | ICD-10-CM | POA: Diagnosis not present

## 2015-04-10 DIAGNOSIS — M170 Bilateral primary osteoarthritis of knee: Secondary | ICD-10-CM | POA: Diagnosis not present

## 2015-04-10 DIAGNOSIS — I7300 Raynaud's syndrome without gangrene: Secondary | ICD-10-CM | POA: Diagnosis not present

## 2015-04-10 DIAGNOSIS — F324 Major depressive disorder, single episode, in partial remission: Secondary | ICD-10-CM | POA: Diagnosis not present

## 2015-04-13 DIAGNOSIS — F324 Major depressive disorder, single episode, in partial remission: Secondary | ICD-10-CM | POA: Diagnosis not present

## 2015-04-13 DIAGNOSIS — M170 Bilateral primary osteoarthritis of knee: Secondary | ICD-10-CM | POA: Diagnosis not present

## 2015-04-13 DIAGNOSIS — M0600 Rheumatoid arthritis without rheumatoid factor, unspecified site: Secondary | ICD-10-CM | POA: Diagnosis not present

## 2015-04-13 DIAGNOSIS — M5136 Other intervertebral disc degeneration, lumbar region: Secondary | ICD-10-CM | POA: Diagnosis not present

## 2015-04-13 DIAGNOSIS — M5030 Other cervical disc degeneration, unspecified cervical region: Secondary | ICD-10-CM | POA: Diagnosis not present

## 2015-04-13 DIAGNOSIS — I7300 Raynaud's syndrome without gangrene: Secondary | ICD-10-CM | POA: Diagnosis not present

## 2015-04-14 DIAGNOSIS — M170 Bilateral primary osteoarthritis of knee: Secondary | ICD-10-CM | POA: Diagnosis not present

## 2015-04-14 DIAGNOSIS — M5030 Other cervical disc degeneration, unspecified cervical region: Secondary | ICD-10-CM | POA: Diagnosis not present

## 2015-04-14 DIAGNOSIS — F324 Major depressive disorder, single episode, in partial remission: Secondary | ICD-10-CM | POA: Diagnosis not present

## 2015-04-14 DIAGNOSIS — I7300 Raynaud's syndrome without gangrene: Secondary | ICD-10-CM | POA: Diagnosis not present

## 2015-04-14 DIAGNOSIS — M5136 Other intervertebral disc degeneration, lumbar region: Secondary | ICD-10-CM | POA: Diagnosis not present

## 2015-04-14 DIAGNOSIS — M0600 Rheumatoid arthritis without rheumatoid factor, unspecified site: Secondary | ICD-10-CM | POA: Diagnosis not present

## 2015-04-15 DIAGNOSIS — M170 Bilateral primary osteoarthritis of knee: Secondary | ICD-10-CM | POA: Diagnosis not present

## 2015-04-15 DIAGNOSIS — I7300 Raynaud's syndrome without gangrene: Secondary | ICD-10-CM | POA: Diagnosis not present

## 2015-04-15 DIAGNOSIS — M0600 Rheumatoid arthritis without rheumatoid factor, unspecified site: Secondary | ICD-10-CM | POA: Diagnosis not present

## 2015-04-15 DIAGNOSIS — M5136 Other intervertebral disc degeneration, lumbar region: Secondary | ICD-10-CM | POA: Diagnosis not present

## 2015-04-15 DIAGNOSIS — F324 Major depressive disorder, single episode, in partial remission: Secondary | ICD-10-CM | POA: Diagnosis not present

## 2015-04-15 DIAGNOSIS — M5030 Other cervical disc degeneration, unspecified cervical region: Secondary | ICD-10-CM | POA: Diagnosis not present

## 2015-04-16 DIAGNOSIS — M5136 Other intervertebral disc degeneration, lumbar region: Secondary | ICD-10-CM | POA: Diagnosis not present

## 2015-04-16 DIAGNOSIS — I7300 Raynaud's syndrome without gangrene: Secondary | ICD-10-CM | POA: Diagnosis not present

## 2015-04-16 DIAGNOSIS — M0600 Rheumatoid arthritis without rheumatoid factor, unspecified site: Secondary | ICD-10-CM | POA: Diagnosis not present

## 2015-04-16 DIAGNOSIS — M170 Bilateral primary osteoarthritis of knee: Secondary | ICD-10-CM | POA: Diagnosis not present

## 2015-04-16 DIAGNOSIS — F324 Major depressive disorder, single episode, in partial remission: Secondary | ICD-10-CM | POA: Diagnosis not present

## 2015-04-16 DIAGNOSIS — M5030 Other cervical disc degeneration, unspecified cervical region: Secondary | ICD-10-CM | POA: Diagnosis not present

## 2015-04-17 DIAGNOSIS — M5136 Other intervertebral disc degeneration, lumbar region: Secondary | ICD-10-CM | POA: Diagnosis not present

## 2015-04-17 DIAGNOSIS — M5030 Other cervical disc degeneration, unspecified cervical region: Secondary | ICD-10-CM | POA: Diagnosis not present

## 2015-04-17 DIAGNOSIS — M170 Bilateral primary osteoarthritis of knee: Secondary | ICD-10-CM | POA: Diagnosis not present

## 2015-04-17 DIAGNOSIS — I7300 Raynaud's syndrome without gangrene: Secondary | ICD-10-CM | POA: Diagnosis not present

## 2015-04-17 DIAGNOSIS — M0600 Rheumatoid arthritis without rheumatoid factor, unspecified site: Secondary | ICD-10-CM | POA: Diagnosis not present

## 2015-04-17 DIAGNOSIS — F324 Major depressive disorder, single episode, in partial remission: Secondary | ICD-10-CM | POA: Diagnosis not present

## 2015-04-20 DIAGNOSIS — I7300 Raynaud's syndrome without gangrene: Secondary | ICD-10-CM | POA: Diagnosis not present

## 2015-04-20 DIAGNOSIS — F324 Major depressive disorder, single episode, in partial remission: Secondary | ICD-10-CM | POA: Diagnosis not present

## 2015-04-20 DIAGNOSIS — M5136 Other intervertebral disc degeneration, lumbar region: Secondary | ICD-10-CM | POA: Diagnosis not present

## 2015-04-20 DIAGNOSIS — M5030 Other cervical disc degeneration, unspecified cervical region: Secondary | ICD-10-CM | POA: Diagnosis not present

## 2015-04-20 DIAGNOSIS — M0600 Rheumatoid arthritis without rheumatoid factor, unspecified site: Secondary | ICD-10-CM | POA: Diagnosis not present

## 2015-04-20 DIAGNOSIS — M170 Bilateral primary osteoarthritis of knee: Secondary | ICD-10-CM | POA: Diagnosis not present

## 2015-04-22 DIAGNOSIS — I7300 Raynaud's syndrome without gangrene: Secondary | ICD-10-CM | POA: Diagnosis not present

## 2015-04-22 DIAGNOSIS — M5136 Other intervertebral disc degeneration, lumbar region: Secondary | ICD-10-CM | POA: Diagnosis not present

## 2015-04-22 DIAGNOSIS — M0609 Rheumatoid arthritis without rheumatoid factor, multiple sites: Secondary | ICD-10-CM | POA: Diagnosis not present

## 2015-04-22 DIAGNOSIS — M810 Age-related osteoporosis without current pathological fracture: Secondary | ICD-10-CM | POA: Diagnosis not present

## 2015-04-22 DIAGNOSIS — M150 Primary generalized (osteo)arthritis: Secondary | ICD-10-CM | POA: Diagnosis not present

## 2015-04-22 DIAGNOSIS — F324 Major depressive disorder, single episode, in partial remission: Secondary | ICD-10-CM | POA: Diagnosis not present

## 2015-04-22 DIAGNOSIS — M0600 Rheumatoid arthritis without rheumatoid factor, unspecified site: Secondary | ICD-10-CM | POA: Diagnosis not present

## 2015-04-22 DIAGNOSIS — M2550 Pain in unspecified joint: Secondary | ICD-10-CM | POA: Diagnosis not present

## 2015-04-22 DIAGNOSIS — R51 Headache: Secondary | ICD-10-CM | POA: Diagnosis not present

## 2015-04-22 DIAGNOSIS — M170 Bilateral primary osteoarthritis of knee: Secondary | ICD-10-CM | POA: Diagnosis not present

## 2015-04-22 DIAGNOSIS — M5030 Other cervical disc degeneration, unspecified cervical region: Secondary | ICD-10-CM | POA: Diagnosis not present

## 2015-04-22 DIAGNOSIS — Z79899 Other long term (current) drug therapy: Secondary | ICD-10-CM | POA: Diagnosis not present

## 2015-04-23 DIAGNOSIS — F324 Major depressive disorder, single episode, in partial remission: Secondary | ICD-10-CM | POA: Diagnosis not present

## 2015-04-23 DIAGNOSIS — I7300 Raynaud's syndrome without gangrene: Secondary | ICD-10-CM | POA: Diagnosis not present

## 2015-04-23 DIAGNOSIS — M5030 Other cervical disc degeneration, unspecified cervical region: Secondary | ICD-10-CM | POA: Diagnosis not present

## 2015-04-23 DIAGNOSIS — M0600 Rheumatoid arthritis without rheumatoid factor, unspecified site: Secondary | ICD-10-CM | POA: Diagnosis not present

## 2015-04-23 DIAGNOSIS — M170 Bilateral primary osteoarthritis of knee: Secondary | ICD-10-CM | POA: Diagnosis not present

## 2015-04-23 DIAGNOSIS — M5136 Other intervertebral disc degeneration, lumbar region: Secondary | ICD-10-CM | POA: Diagnosis not present

## 2015-04-24 DIAGNOSIS — I7300 Raynaud's syndrome without gangrene: Secondary | ICD-10-CM | POA: Diagnosis not present

## 2015-04-24 DIAGNOSIS — M5030 Other cervical disc degeneration, unspecified cervical region: Secondary | ICD-10-CM | POA: Diagnosis not present

## 2015-04-24 DIAGNOSIS — M170 Bilateral primary osteoarthritis of knee: Secondary | ICD-10-CM | POA: Diagnosis not present

## 2015-04-24 DIAGNOSIS — M0600 Rheumatoid arthritis without rheumatoid factor, unspecified site: Secondary | ICD-10-CM | POA: Diagnosis not present

## 2015-04-24 DIAGNOSIS — F324 Major depressive disorder, single episode, in partial remission: Secondary | ICD-10-CM | POA: Diagnosis not present

## 2015-04-24 DIAGNOSIS — M5136 Other intervertebral disc degeneration, lumbar region: Secondary | ICD-10-CM | POA: Diagnosis not present

## 2015-04-27 DIAGNOSIS — M5136 Other intervertebral disc degeneration, lumbar region: Secondary | ICD-10-CM | POA: Diagnosis not present

## 2015-04-27 DIAGNOSIS — M0600 Rheumatoid arthritis without rheumatoid factor, unspecified site: Secondary | ICD-10-CM | POA: Diagnosis not present

## 2015-04-27 DIAGNOSIS — M170 Bilateral primary osteoarthritis of knee: Secondary | ICD-10-CM | POA: Diagnosis not present

## 2015-04-27 DIAGNOSIS — F324 Major depressive disorder, single episode, in partial remission: Secondary | ICD-10-CM | POA: Diagnosis not present

## 2015-04-27 DIAGNOSIS — M5030 Other cervical disc degeneration, unspecified cervical region: Secondary | ICD-10-CM | POA: Diagnosis not present

## 2015-04-27 DIAGNOSIS — I7300 Raynaud's syndrome without gangrene: Secondary | ICD-10-CM | POA: Diagnosis not present

## 2015-04-28 DIAGNOSIS — M170 Bilateral primary osteoarthritis of knee: Secondary | ICD-10-CM | POA: Diagnosis not present

## 2015-04-28 DIAGNOSIS — M5030 Other cervical disc degeneration, unspecified cervical region: Secondary | ICD-10-CM | POA: Diagnosis not present

## 2015-04-28 DIAGNOSIS — F324 Major depressive disorder, single episode, in partial remission: Secondary | ICD-10-CM | POA: Diagnosis not present

## 2015-04-28 DIAGNOSIS — H353221 Exudative age-related macular degeneration, left eye, with active choroidal neovascularization: Secondary | ICD-10-CM | POA: Diagnosis not present

## 2015-04-28 DIAGNOSIS — M0600 Rheumatoid arthritis without rheumatoid factor, unspecified site: Secondary | ICD-10-CM | POA: Diagnosis not present

## 2015-04-28 DIAGNOSIS — I7300 Raynaud's syndrome without gangrene: Secondary | ICD-10-CM | POA: Diagnosis not present

## 2015-04-28 DIAGNOSIS — M5136 Other intervertebral disc degeneration, lumbar region: Secondary | ICD-10-CM | POA: Diagnosis not present

## 2015-04-29 DIAGNOSIS — M5030 Other cervical disc degeneration, unspecified cervical region: Secondary | ICD-10-CM | POA: Diagnosis not present

## 2015-04-29 DIAGNOSIS — M5136 Other intervertebral disc degeneration, lumbar region: Secondary | ICD-10-CM | POA: Diagnosis not present

## 2015-04-29 DIAGNOSIS — M0600 Rheumatoid arthritis without rheumatoid factor, unspecified site: Secondary | ICD-10-CM | POA: Diagnosis not present

## 2015-04-29 DIAGNOSIS — M170 Bilateral primary osteoarthritis of knee: Secondary | ICD-10-CM | POA: Diagnosis not present

## 2015-04-29 DIAGNOSIS — M810 Age-related osteoporosis without current pathological fracture: Secondary | ICD-10-CM | POA: Diagnosis not present

## 2015-04-29 DIAGNOSIS — F324 Major depressive disorder, single episode, in partial remission: Secondary | ICD-10-CM | POA: Diagnosis not present

## 2015-04-29 DIAGNOSIS — I7300 Raynaud's syndrome without gangrene: Secondary | ICD-10-CM | POA: Diagnosis not present

## 2015-04-30 DIAGNOSIS — F324 Major depressive disorder, single episode, in partial remission: Secondary | ICD-10-CM | POA: Diagnosis not present

## 2015-04-30 DIAGNOSIS — M5030 Other cervical disc degeneration, unspecified cervical region: Secondary | ICD-10-CM | POA: Diagnosis not present

## 2015-04-30 DIAGNOSIS — M170 Bilateral primary osteoarthritis of knee: Secondary | ICD-10-CM | POA: Diagnosis not present

## 2015-04-30 DIAGNOSIS — M0600 Rheumatoid arthritis without rheumatoid factor, unspecified site: Secondary | ICD-10-CM | POA: Diagnosis not present

## 2015-04-30 DIAGNOSIS — M5136 Other intervertebral disc degeneration, lumbar region: Secondary | ICD-10-CM | POA: Diagnosis not present

## 2015-04-30 DIAGNOSIS — I7300 Raynaud's syndrome without gangrene: Secondary | ICD-10-CM | POA: Diagnosis not present

## 2015-05-04 DIAGNOSIS — M170 Bilateral primary osteoarthritis of knee: Secondary | ICD-10-CM | POA: Diagnosis not present

## 2015-05-04 DIAGNOSIS — I7300 Raynaud's syndrome without gangrene: Secondary | ICD-10-CM | POA: Diagnosis not present

## 2015-05-04 DIAGNOSIS — F324 Major depressive disorder, single episode, in partial remission: Secondary | ICD-10-CM | POA: Diagnosis not present

## 2015-05-04 DIAGNOSIS — M5030 Other cervical disc degeneration, unspecified cervical region: Secondary | ICD-10-CM | POA: Diagnosis not present

## 2015-05-04 DIAGNOSIS — M0600 Rheumatoid arthritis without rheumatoid factor, unspecified site: Secondary | ICD-10-CM | POA: Diagnosis not present

## 2015-05-04 DIAGNOSIS — M5136 Other intervertebral disc degeneration, lumbar region: Secondary | ICD-10-CM | POA: Diagnosis not present

## 2015-05-05 DIAGNOSIS — F324 Major depressive disorder, single episode, in partial remission: Secondary | ICD-10-CM | POA: Diagnosis not present

## 2015-05-05 DIAGNOSIS — M170 Bilateral primary osteoarthritis of knee: Secondary | ICD-10-CM | POA: Diagnosis not present

## 2015-05-05 DIAGNOSIS — M0600 Rheumatoid arthritis without rheumatoid factor, unspecified site: Secondary | ICD-10-CM | POA: Diagnosis not present

## 2015-05-05 DIAGNOSIS — M5030 Other cervical disc degeneration, unspecified cervical region: Secondary | ICD-10-CM | POA: Diagnosis not present

## 2015-05-05 DIAGNOSIS — M5136 Other intervertebral disc degeneration, lumbar region: Secondary | ICD-10-CM | POA: Diagnosis not present

## 2015-05-05 DIAGNOSIS — I7300 Raynaud's syndrome without gangrene: Secondary | ICD-10-CM | POA: Diagnosis not present

## 2015-05-06 DIAGNOSIS — F320 Major depressive disorder, single episode, mild: Secondary | ICD-10-CM | POA: Diagnosis not present

## 2015-05-06 DIAGNOSIS — F419 Anxiety disorder, unspecified: Secondary | ICD-10-CM | POA: Diagnosis not present

## 2015-05-07 DIAGNOSIS — M5136 Other intervertebral disc degeneration, lumbar region: Secondary | ICD-10-CM | POA: Diagnosis not present

## 2015-05-07 DIAGNOSIS — F324 Major depressive disorder, single episode, in partial remission: Secondary | ICD-10-CM | POA: Diagnosis not present

## 2015-05-07 DIAGNOSIS — I7300 Raynaud's syndrome without gangrene: Secondary | ICD-10-CM | POA: Diagnosis not present

## 2015-05-07 DIAGNOSIS — M170 Bilateral primary osteoarthritis of knee: Secondary | ICD-10-CM | POA: Diagnosis not present

## 2015-05-07 DIAGNOSIS — M5030 Other cervical disc degeneration, unspecified cervical region: Secondary | ICD-10-CM | POA: Diagnosis not present

## 2015-05-07 DIAGNOSIS — M0600 Rheumatoid arthritis without rheumatoid factor, unspecified site: Secondary | ICD-10-CM | POA: Diagnosis not present

## 2015-05-11 DIAGNOSIS — M0600 Rheumatoid arthritis without rheumatoid factor, unspecified site: Secondary | ICD-10-CM | POA: Diagnosis not present

## 2015-05-11 DIAGNOSIS — I7300 Raynaud's syndrome without gangrene: Secondary | ICD-10-CM | POA: Diagnosis not present

## 2015-05-11 DIAGNOSIS — F324 Major depressive disorder, single episode, in partial remission: Secondary | ICD-10-CM | POA: Diagnosis not present

## 2015-05-11 DIAGNOSIS — M5030 Other cervical disc degeneration, unspecified cervical region: Secondary | ICD-10-CM | POA: Diagnosis not present

## 2015-05-11 DIAGNOSIS — M5136 Other intervertebral disc degeneration, lumbar region: Secondary | ICD-10-CM | POA: Diagnosis not present

## 2015-05-11 DIAGNOSIS — M170 Bilateral primary osteoarthritis of knee: Secondary | ICD-10-CM | POA: Diagnosis not present

## 2015-05-12 DIAGNOSIS — I7300 Raynaud's syndrome without gangrene: Secondary | ICD-10-CM | POA: Diagnosis not present

## 2015-05-12 DIAGNOSIS — M170 Bilateral primary osteoarthritis of knee: Secondary | ICD-10-CM | POA: Diagnosis not present

## 2015-05-12 DIAGNOSIS — M5136 Other intervertebral disc degeneration, lumbar region: Secondary | ICD-10-CM | POA: Diagnosis not present

## 2015-05-12 DIAGNOSIS — M0600 Rheumatoid arthritis without rheumatoid factor, unspecified site: Secondary | ICD-10-CM | POA: Diagnosis not present

## 2015-05-12 DIAGNOSIS — M5030 Other cervical disc degeneration, unspecified cervical region: Secondary | ICD-10-CM | POA: Diagnosis not present

## 2015-05-12 DIAGNOSIS — F324 Major depressive disorder, single episode, in partial remission: Secondary | ICD-10-CM | POA: Diagnosis not present

## 2015-05-13 DIAGNOSIS — M50321 Other cervical disc degeneration at C4-C5 level: Secondary | ICD-10-CM | POA: Diagnosis not present

## 2015-05-13 DIAGNOSIS — M9905 Segmental and somatic dysfunction of pelvic region: Secondary | ICD-10-CM | POA: Diagnosis not present

## 2015-05-13 DIAGNOSIS — M5384 Other specified dorsopathies, thoracic region: Secondary | ICD-10-CM | POA: Diagnosis not present

## 2015-05-13 DIAGNOSIS — M9902 Segmental and somatic dysfunction of thoracic region: Secondary | ICD-10-CM | POA: Diagnosis not present

## 2015-05-13 DIAGNOSIS — M9901 Segmental and somatic dysfunction of cervical region: Secondary | ICD-10-CM | POA: Diagnosis not present

## 2015-05-13 DIAGNOSIS — M5117 Intervertebral disc disorders with radiculopathy, lumbosacral region: Secondary | ICD-10-CM | POA: Diagnosis not present

## 2015-05-13 DIAGNOSIS — M9904 Segmental and somatic dysfunction of sacral region: Secondary | ICD-10-CM | POA: Diagnosis not present

## 2015-05-13 DIAGNOSIS — Q72812 Congenital shortening of left lower limb: Secondary | ICD-10-CM | POA: Diagnosis not present

## 2015-05-13 DIAGNOSIS — M9903 Segmental and somatic dysfunction of lumbar region: Secondary | ICD-10-CM | POA: Diagnosis not present

## 2015-05-13 DIAGNOSIS — G603 Idiopathic progressive neuropathy: Secondary | ICD-10-CM | POA: Diagnosis not present

## 2015-05-14 DIAGNOSIS — M5030 Other cervical disc degeneration, unspecified cervical region: Secondary | ICD-10-CM | POA: Diagnosis not present

## 2015-05-14 DIAGNOSIS — M170 Bilateral primary osteoarthritis of knee: Secondary | ICD-10-CM | POA: Diagnosis not present

## 2015-05-14 DIAGNOSIS — M0600 Rheumatoid arthritis without rheumatoid factor, unspecified site: Secondary | ICD-10-CM | POA: Diagnosis not present

## 2015-05-14 DIAGNOSIS — M5136 Other intervertebral disc degeneration, lumbar region: Secondary | ICD-10-CM | POA: Diagnosis not present

## 2015-05-14 DIAGNOSIS — F324 Major depressive disorder, single episode, in partial remission: Secondary | ICD-10-CM | POA: Diagnosis not present

## 2015-05-14 DIAGNOSIS — I7300 Raynaud's syndrome without gangrene: Secondary | ICD-10-CM | POA: Diagnosis not present

## 2015-05-18 DIAGNOSIS — M170 Bilateral primary osteoarthritis of knee: Secondary | ICD-10-CM | POA: Diagnosis not present

## 2015-05-18 DIAGNOSIS — I7300 Raynaud's syndrome without gangrene: Secondary | ICD-10-CM | POA: Diagnosis not present

## 2015-05-18 DIAGNOSIS — M0600 Rheumatoid arthritis without rheumatoid factor, unspecified site: Secondary | ICD-10-CM | POA: Diagnosis not present

## 2015-05-18 DIAGNOSIS — M5136 Other intervertebral disc degeneration, lumbar region: Secondary | ICD-10-CM | POA: Diagnosis not present

## 2015-05-18 DIAGNOSIS — M5030 Other cervical disc degeneration, unspecified cervical region: Secondary | ICD-10-CM | POA: Diagnosis not present

## 2015-05-18 DIAGNOSIS — F324 Major depressive disorder, single episode, in partial remission: Secondary | ICD-10-CM | POA: Diagnosis not present

## 2015-05-19 DIAGNOSIS — F324 Major depressive disorder, single episode, in partial remission: Secondary | ICD-10-CM | POA: Diagnosis not present

## 2015-05-19 DIAGNOSIS — M170 Bilateral primary osteoarthritis of knee: Secondary | ICD-10-CM | POA: Diagnosis not present

## 2015-05-19 DIAGNOSIS — M0600 Rheumatoid arthritis without rheumatoid factor, unspecified site: Secondary | ICD-10-CM | POA: Diagnosis not present

## 2015-05-19 DIAGNOSIS — M5136 Other intervertebral disc degeneration, lumbar region: Secondary | ICD-10-CM | POA: Diagnosis not present

## 2015-05-19 DIAGNOSIS — I7300 Raynaud's syndrome without gangrene: Secondary | ICD-10-CM | POA: Diagnosis not present

## 2015-05-19 DIAGNOSIS — M5030 Other cervical disc degeneration, unspecified cervical region: Secondary | ICD-10-CM | POA: Diagnosis not present

## 2015-05-21 DIAGNOSIS — M9901 Segmental and somatic dysfunction of cervical region: Secondary | ICD-10-CM | POA: Diagnosis not present

## 2015-05-21 DIAGNOSIS — M5030 Other cervical disc degeneration, unspecified cervical region: Secondary | ICD-10-CM | POA: Diagnosis not present

## 2015-05-21 DIAGNOSIS — M5384 Other specified dorsopathies, thoracic region: Secondary | ICD-10-CM | POA: Diagnosis not present

## 2015-05-21 DIAGNOSIS — M0600 Rheumatoid arthritis without rheumatoid factor, unspecified site: Secondary | ICD-10-CM | POA: Diagnosis not present

## 2015-05-21 DIAGNOSIS — F324 Major depressive disorder, single episode, in partial remission: Secondary | ICD-10-CM | POA: Diagnosis not present

## 2015-05-21 DIAGNOSIS — G603 Idiopathic progressive neuropathy: Secondary | ICD-10-CM | POA: Diagnosis not present

## 2015-05-21 DIAGNOSIS — M5117 Intervertebral disc disorders with radiculopathy, lumbosacral region: Secondary | ICD-10-CM | POA: Diagnosis not present

## 2015-05-21 DIAGNOSIS — M9903 Segmental and somatic dysfunction of lumbar region: Secondary | ICD-10-CM | POA: Diagnosis not present

## 2015-05-21 DIAGNOSIS — M9902 Segmental and somatic dysfunction of thoracic region: Secondary | ICD-10-CM | POA: Diagnosis not present

## 2015-05-21 DIAGNOSIS — I7300 Raynaud's syndrome without gangrene: Secondary | ICD-10-CM | POA: Diagnosis not present

## 2015-05-21 DIAGNOSIS — M170 Bilateral primary osteoarthritis of knee: Secondary | ICD-10-CM | POA: Diagnosis not present

## 2015-05-21 DIAGNOSIS — M9905 Segmental and somatic dysfunction of pelvic region: Secondary | ICD-10-CM | POA: Diagnosis not present

## 2015-05-21 DIAGNOSIS — M9904 Segmental and somatic dysfunction of sacral region: Secondary | ICD-10-CM | POA: Diagnosis not present

## 2015-05-21 DIAGNOSIS — M5136 Other intervertebral disc degeneration, lumbar region: Secondary | ICD-10-CM | POA: Diagnosis not present

## 2015-05-21 DIAGNOSIS — M50321 Other cervical disc degeneration at C4-C5 level: Secondary | ICD-10-CM | POA: Diagnosis not present

## 2015-05-21 DIAGNOSIS — Q72812 Congenital shortening of left lower limb: Secondary | ICD-10-CM | POA: Diagnosis not present

## 2015-05-26 DIAGNOSIS — M5030 Other cervical disc degeneration, unspecified cervical region: Secondary | ICD-10-CM | POA: Diagnosis not present

## 2015-05-26 DIAGNOSIS — M5136 Other intervertebral disc degeneration, lumbar region: Secondary | ICD-10-CM | POA: Diagnosis not present

## 2015-05-26 DIAGNOSIS — M9902 Segmental and somatic dysfunction of thoracic region: Secondary | ICD-10-CM | POA: Diagnosis not present

## 2015-05-26 DIAGNOSIS — M9901 Segmental and somatic dysfunction of cervical region: Secondary | ICD-10-CM | POA: Diagnosis not present

## 2015-05-26 DIAGNOSIS — M0600 Rheumatoid arthritis without rheumatoid factor, unspecified site: Secondary | ICD-10-CM | POA: Diagnosis not present

## 2015-05-26 DIAGNOSIS — Q72812 Congenital shortening of left lower limb: Secondary | ICD-10-CM | POA: Diagnosis not present

## 2015-05-26 DIAGNOSIS — I7300 Raynaud's syndrome without gangrene: Secondary | ICD-10-CM | POA: Diagnosis not present

## 2015-05-26 DIAGNOSIS — M9903 Segmental and somatic dysfunction of lumbar region: Secondary | ICD-10-CM | POA: Diagnosis not present

## 2015-05-26 DIAGNOSIS — M5117 Intervertebral disc disorders with radiculopathy, lumbosacral region: Secondary | ICD-10-CM | POA: Diagnosis not present

## 2015-05-26 DIAGNOSIS — M9905 Segmental and somatic dysfunction of pelvic region: Secondary | ICD-10-CM | POA: Diagnosis not present

## 2015-05-26 DIAGNOSIS — F324 Major depressive disorder, single episode, in partial remission: Secondary | ICD-10-CM | POA: Diagnosis not present

## 2015-05-26 DIAGNOSIS — G603 Idiopathic progressive neuropathy: Secondary | ICD-10-CM | POA: Diagnosis not present

## 2015-05-26 DIAGNOSIS — M5384 Other specified dorsopathies, thoracic region: Secondary | ICD-10-CM | POA: Diagnosis not present

## 2015-05-26 DIAGNOSIS — M50321 Other cervical disc degeneration at C4-C5 level: Secondary | ICD-10-CM | POA: Diagnosis not present

## 2015-05-26 DIAGNOSIS — M170 Bilateral primary osteoarthritis of knee: Secondary | ICD-10-CM | POA: Diagnosis not present

## 2015-05-26 DIAGNOSIS — M9904 Segmental and somatic dysfunction of sacral region: Secondary | ICD-10-CM | POA: Diagnosis not present

## 2015-05-27 DIAGNOSIS — M5030 Other cervical disc degeneration, unspecified cervical region: Secondary | ICD-10-CM | POA: Diagnosis not present

## 2015-05-27 DIAGNOSIS — F324 Major depressive disorder, single episode, in partial remission: Secondary | ICD-10-CM | POA: Diagnosis not present

## 2015-05-27 DIAGNOSIS — M170 Bilateral primary osteoarthritis of knee: Secondary | ICD-10-CM | POA: Diagnosis not present

## 2015-05-27 DIAGNOSIS — M5136 Other intervertebral disc degeneration, lumbar region: Secondary | ICD-10-CM | POA: Diagnosis not present

## 2015-05-27 DIAGNOSIS — I7300 Raynaud's syndrome without gangrene: Secondary | ICD-10-CM | POA: Diagnosis not present

## 2015-05-27 DIAGNOSIS — M0600 Rheumatoid arthritis without rheumatoid factor, unspecified site: Secondary | ICD-10-CM | POA: Diagnosis not present

## 2015-05-28 DIAGNOSIS — M9901 Segmental and somatic dysfunction of cervical region: Secondary | ICD-10-CM | POA: Diagnosis not present

## 2015-05-28 DIAGNOSIS — G603 Idiopathic progressive neuropathy: Secondary | ICD-10-CM | POA: Diagnosis not present

## 2015-05-28 DIAGNOSIS — M9903 Segmental and somatic dysfunction of lumbar region: Secondary | ICD-10-CM | POA: Diagnosis not present

## 2015-05-28 DIAGNOSIS — M9904 Segmental and somatic dysfunction of sacral region: Secondary | ICD-10-CM | POA: Diagnosis not present

## 2015-05-28 DIAGNOSIS — M5117 Intervertebral disc disorders with radiculopathy, lumbosacral region: Secondary | ICD-10-CM | POA: Diagnosis not present

## 2015-05-28 DIAGNOSIS — M9905 Segmental and somatic dysfunction of pelvic region: Secondary | ICD-10-CM | POA: Diagnosis not present

## 2015-05-28 DIAGNOSIS — M9902 Segmental and somatic dysfunction of thoracic region: Secondary | ICD-10-CM | POA: Diagnosis not present

## 2015-05-28 DIAGNOSIS — M5384 Other specified dorsopathies, thoracic region: Secondary | ICD-10-CM | POA: Diagnosis not present

## 2015-05-28 DIAGNOSIS — Q72812 Congenital shortening of left lower limb: Secondary | ICD-10-CM | POA: Diagnosis not present

## 2015-05-28 DIAGNOSIS — M50321 Other cervical disc degeneration at C4-C5 level: Secondary | ICD-10-CM | POA: Diagnosis not present

## 2015-05-29 DIAGNOSIS — M170 Bilateral primary osteoarthritis of knee: Secondary | ICD-10-CM | POA: Diagnosis not present

## 2015-05-29 DIAGNOSIS — F324 Major depressive disorder, single episode, in partial remission: Secondary | ICD-10-CM | POA: Diagnosis not present

## 2015-05-29 DIAGNOSIS — M5030 Other cervical disc degeneration, unspecified cervical region: Secondary | ICD-10-CM | POA: Diagnosis not present

## 2015-05-29 DIAGNOSIS — M0600 Rheumatoid arthritis without rheumatoid factor, unspecified site: Secondary | ICD-10-CM | POA: Diagnosis not present

## 2015-05-29 DIAGNOSIS — M5136 Other intervertebral disc degeneration, lumbar region: Secondary | ICD-10-CM | POA: Diagnosis not present

## 2015-05-29 DIAGNOSIS — I7300 Raynaud's syndrome without gangrene: Secondary | ICD-10-CM | POA: Diagnosis not present

## 2015-06-01 DIAGNOSIS — M50321 Other cervical disc degeneration at C4-C5 level: Secondary | ICD-10-CM | POA: Diagnosis not present

## 2015-06-01 DIAGNOSIS — M9904 Segmental and somatic dysfunction of sacral region: Secondary | ICD-10-CM | POA: Diagnosis not present

## 2015-06-01 DIAGNOSIS — M5117 Intervertebral disc disorders with radiculopathy, lumbosacral region: Secondary | ICD-10-CM | POA: Diagnosis not present

## 2015-06-01 DIAGNOSIS — G603 Idiopathic progressive neuropathy: Secondary | ICD-10-CM | POA: Diagnosis not present

## 2015-06-01 DIAGNOSIS — M9901 Segmental and somatic dysfunction of cervical region: Secondary | ICD-10-CM | POA: Diagnosis not present

## 2015-06-01 DIAGNOSIS — Q72812 Congenital shortening of left lower limb: Secondary | ICD-10-CM | POA: Diagnosis not present

## 2015-06-01 DIAGNOSIS — M9903 Segmental and somatic dysfunction of lumbar region: Secondary | ICD-10-CM | POA: Diagnosis not present

## 2015-06-01 DIAGNOSIS — M5384 Other specified dorsopathies, thoracic region: Secondary | ICD-10-CM | POA: Diagnosis not present

## 2015-06-01 DIAGNOSIS — M9902 Segmental and somatic dysfunction of thoracic region: Secondary | ICD-10-CM | POA: Diagnosis not present

## 2015-06-01 DIAGNOSIS — M9905 Segmental and somatic dysfunction of pelvic region: Secondary | ICD-10-CM | POA: Diagnosis not present

## 2015-06-03 DIAGNOSIS — M0600 Rheumatoid arthritis without rheumatoid factor, unspecified site: Secondary | ICD-10-CM | POA: Diagnosis not present

## 2015-06-03 DIAGNOSIS — I7300 Raynaud's syndrome without gangrene: Secondary | ICD-10-CM | POA: Diagnosis not present

## 2015-06-03 DIAGNOSIS — F324 Major depressive disorder, single episode, in partial remission: Secondary | ICD-10-CM | POA: Diagnosis not present

## 2015-06-03 DIAGNOSIS — M5030 Other cervical disc degeneration, unspecified cervical region: Secondary | ICD-10-CM | POA: Diagnosis not present

## 2015-06-03 DIAGNOSIS — M5136 Other intervertebral disc degeneration, lumbar region: Secondary | ICD-10-CM | POA: Diagnosis not present

## 2015-06-03 DIAGNOSIS — M170 Bilateral primary osteoarthritis of knee: Secondary | ICD-10-CM | POA: Diagnosis not present

## 2015-06-04 DIAGNOSIS — Q72812 Congenital shortening of left lower limb: Secondary | ICD-10-CM | POA: Diagnosis not present

## 2015-06-04 DIAGNOSIS — M5117 Intervertebral disc disorders with radiculopathy, lumbosacral region: Secondary | ICD-10-CM | POA: Diagnosis not present

## 2015-06-04 DIAGNOSIS — M9903 Segmental and somatic dysfunction of lumbar region: Secondary | ICD-10-CM | POA: Diagnosis not present

## 2015-06-04 DIAGNOSIS — M9904 Segmental and somatic dysfunction of sacral region: Secondary | ICD-10-CM | POA: Diagnosis not present

## 2015-06-04 DIAGNOSIS — M9901 Segmental and somatic dysfunction of cervical region: Secondary | ICD-10-CM | POA: Diagnosis not present

## 2015-06-04 DIAGNOSIS — M50321 Other cervical disc degeneration at C4-C5 level: Secondary | ICD-10-CM | POA: Diagnosis not present

## 2015-06-04 DIAGNOSIS — M9905 Segmental and somatic dysfunction of pelvic region: Secondary | ICD-10-CM | POA: Diagnosis not present

## 2015-06-04 DIAGNOSIS — M9902 Segmental and somatic dysfunction of thoracic region: Secondary | ICD-10-CM | POA: Diagnosis not present

## 2015-06-04 DIAGNOSIS — M5384 Other specified dorsopathies, thoracic region: Secondary | ICD-10-CM | POA: Diagnosis not present

## 2015-06-04 DIAGNOSIS — G603 Idiopathic progressive neuropathy: Secondary | ICD-10-CM | POA: Diagnosis not present

## 2015-06-05 DIAGNOSIS — I7300 Raynaud's syndrome without gangrene: Secondary | ICD-10-CM | POA: Diagnosis not present

## 2015-06-05 DIAGNOSIS — M5136 Other intervertebral disc degeneration, lumbar region: Secondary | ICD-10-CM | POA: Diagnosis not present

## 2015-06-05 DIAGNOSIS — M0600 Rheumatoid arthritis without rheumatoid factor, unspecified site: Secondary | ICD-10-CM | POA: Diagnosis not present

## 2015-06-05 DIAGNOSIS — F324 Major depressive disorder, single episode, in partial remission: Secondary | ICD-10-CM | POA: Diagnosis not present

## 2015-06-05 DIAGNOSIS — M170 Bilateral primary osteoarthritis of knee: Secondary | ICD-10-CM | POA: Diagnosis not present

## 2015-06-05 DIAGNOSIS — M5030 Other cervical disc degeneration, unspecified cervical region: Secondary | ICD-10-CM | POA: Diagnosis not present

## 2015-06-08 DIAGNOSIS — M9904 Segmental and somatic dysfunction of sacral region: Secondary | ICD-10-CM | POA: Diagnosis not present

## 2015-06-08 DIAGNOSIS — M9905 Segmental and somatic dysfunction of pelvic region: Secondary | ICD-10-CM | POA: Diagnosis not present

## 2015-06-08 DIAGNOSIS — M9902 Segmental and somatic dysfunction of thoracic region: Secondary | ICD-10-CM | POA: Diagnosis not present

## 2015-06-08 DIAGNOSIS — M5384 Other specified dorsopathies, thoracic region: Secondary | ICD-10-CM | POA: Diagnosis not present

## 2015-06-08 DIAGNOSIS — M50321 Other cervical disc degeneration at C4-C5 level: Secondary | ICD-10-CM | POA: Diagnosis not present

## 2015-06-08 DIAGNOSIS — M9901 Segmental and somatic dysfunction of cervical region: Secondary | ICD-10-CM | POA: Diagnosis not present

## 2015-06-08 DIAGNOSIS — Q72812 Congenital shortening of left lower limb: Secondary | ICD-10-CM | POA: Diagnosis not present

## 2015-06-08 DIAGNOSIS — M9903 Segmental and somatic dysfunction of lumbar region: Secondary | ICD-10-CM | POA: Diagnosis not present

## 2015-06-08 DIAGNOSIS — G603 Idiopathic progressive neuropathy: Secondary | ICD-10-CM | POA: Diagnosis not present

## 2015-06-08 DIAGNOSIS — M5117 Intervertebral disc disorders with radiculopathy, lumbosacral region: Secondary | ICD-10-CM | POA: Diagnosis not present

## 2015-06-09 DIAGNOSIS — H353221 Exudative age-related macular degeneration, left eye, with active choroidal neovascularization: Secondary | ICD-10-CM | POA: Diagnosis not present

## 2015-06-09 DIAGNOSIS — F320 Major depressive disorder, single episode, mild: Secondary | ICD-10-CM | POA: Diagnosis not present

## 2015-06-11 DIAGNOSIS — M9904 Segmental and somatic dysfunction of sacral region: Secondary | ICD-10-CM | POA: Diagnosis not present

## 2015-06-11 DIAGNOSIS — Q72812 Congenital shortening of left lower limb: Secondary | ICD-10-CM | POA: Diagnosis not present

## 2015-06-11 DIAGNOSIS — M9901 Segmental and somatic dysfunction of cervical region: Secondary | ICD-10-CM | POA: Diagnosis not present

## 2015-06-11 DIAGNOSIS — M9903 Segmental and somatic dysfunction of lumbar region: Secondary | ICD-10-CM | POA: Diagnosis not present

## 2015-06-11 DIAGNOSIS — M9905 Segmental and somatic dysfunction of pelvic region: Secondary | ICD-10-CM | POA: Diagnosis not present

## 2015-06-11 DIAGNOSIS — M5117 Intervertebral disc disorders with radiculopathy, lumbosacral region: Secondary | ICD-10-CM | POA: Diagnosis not present

## 2015-06-11 DIAGNOSIS — M9902 Segmental and somatic dysfunction of thoracic region: Secondary | ICD-10-CM | POA: Diagnosis not present

## 2015-06-11 DIAGNOSIS — G603 Idiopathic progressive neuropathy: Secondary | ICD-10-CM | POA: Diagnosis not present

## 2015-06-11 DIAGNOSIS — M5384 Other specified dorsopathies, thoracic region: Secondary | ICD-10-CM | POA: Diagnosis not present

## 2015-06-11 DIAGNOSIS — M50321 Other cervical disc degeneration at C4-C5 level: Secondary | ICD-10-CM | POA: Diagnosis not present

## 2015-06-16 DIAGNOSIS — M9905 Segmental and somatic dysfunction of pelvic region: Secondary | ICD-10-CM | POA: Diagnosis not present

## 2015-06-16 DIAGNOSIS — M9901 Segmental and somatic dysfunction of cervical region: Secondary | ICD-10-CM | POA: Diagnosis not present

## 2015-06-16 DIAGNOSIS — L84 Corns and callosities: Secondary | ICD-10-CM | POA: Diagnosis not present

## 2015-06-16 DIAGNOSIS — M5384 Other specified dorsopathies, thoracic region: Secondary | ICD-10-CM | POA: Diagnosis not present

## 2015-06-16 DIAGNOSIS — M5117 Intervertebral disc disorders with radiculopathy, lumbosacral region: Secondary | ICD-10-CM | POA: Diagnosis not present

## 2015-06-16 DIAGNOSIS — Q72812 Congenital shortening of left lower limb: Secondary | ICD-10-CM | POA: Diagnosis not present

## 2015-06-16 DIAGNOSIS — M50321 Other cervical disc degeneration at C4-C5 level: Secondary | ICD-10-CM | POA: Diagnosis not present

## 2015-06-16 DIAGNOSIS — G603 Idiopathic progressive neuropathy: Secondary | ICD-10-CM | POA: Diagnosis not present

## 2015-06-16 DIAGNOSIS — M179 Osteoarthritis of knee, unspecified: Secondary | ICD-10-CM | POA: Diagnosis not present

## 2015-06-16 DIAGNOSIS — M9902 Segmental and somatic dysfunction of thoracic region: Secondary | ICD-10-CM | POA: Diagnosis not present

## 2015-06-16 DIAGNOSIS — M0600 Rheumatoid arthritis without rheumatoid factor, unspecified site: Secondary | ICD-10-CM | POA: Diagnosis not present

## 2015-06-16 DIAGNOSIS — M9904 Segmental and somatic dysfunction of sacral region: Secondary | ICD-10-CM | POA: Diagnosis not present

## 2015-06-16 DIAGNOSIS — G894 Chronic pain syndrome: Secondary | ICD-10-CM | POA: Diagnosis not present

## 2015-06-16 DIAGNOSIS — L03032 Cellulitis of left toe: Secondary | ICD-10-CM | POA: Diagnosis not present

## 2015-06-16 DIAGNOSIS — M9903 Segmental and somatic dysfunction of lumbar region: Secondary | ICD-10-CM | POA: Diagnosis not present

## 2015-06-16 DIAGNOSIS — F324 Major depressive disorder, single episode, in partial remission: Secondary | ICD-10-CM | POA: Diagnosis not present

## 2015-06-18 DIAGNOSIS — G603 Idiopathic progressive neuropathy: Secondary | ICD-10-CM | POA: Diagnosis not present

## 2015-06-18 DIAGNOSIS — M9904 Segmental and somatic dysfunction of sacral region: Secondary | ICD-10-CM | POA: Diagnosis not present

## 2015-06-18 DIAGNOSIS — M9905 Segmental and somatic dysfunction of pelvic region: Secondary | ICD-10-CM | POA: Diagnosis not present

## 2015-06-18 DIAGNOSIS — M50321 Other cervical disc degeneration at C4-C5 level: Secondary | ICD-10-CM | POA: Diagnosis not present

## 2015-06-18 DIAGNOSIS — Q72812 Congenital shortening of left lower limb: Secondary | ICD-10-CM | POA: Diagnosis not present

## 2015-06-18 DIAGNOSIS — M5384 Other specified dorsopathies, thoracic region: Secondary | ICD-10-CM | POA: Diagnosis not present

## 2015-06-18 DIAGNOSIS — M9901 Segmental and somatic dysfunction of cervical region: Secondary | ICD-10-CM | POA: Diagnosis not present

## 2015-06-18 DIAGNOSIS — M5117 Intervertebral disc disorders with radiculopathy, lumbosacral region: Secondary | ICD-10-CM | POA: Diagnosis not present

## 2015-06-18 DIAGNOSIS — M9903 Segmental and somatic dysfunction of lumbar region: Secondary | ICD-10-CM | POA: Diagnosis not present

## 2015-06-18 DIAGNOSIS — M9902 Segmental and somatic dysfunction of thoracic region: Secondary | ICD-10-CM | POA: Diagnosis not present

## 2015-06-19 DIAGNOSIS — R296 Repeated falls: Secondary | ICD-10-CM | POA: Diagnosis not present

## 2015-06-19 DIAGNOSIS — M1990 Unspecified osteoarthritis, unspecified site: Secondary | ICD-10-CM | POA: Diagnosis not present

## 2015-06-19 DIAGNOSIS — D692 Other nonthrombocytopenic purpura: Secondary | ICD-10-CM | POA: Diagnosis not present

## 2015-06-19 DIAGNOSIS — G894 Chronic pain syndrome: Secondary | ICD-10-CM | POA: Diagnosis not present

## 2015-06-19 DIAGNOSIS — S50811A Abrasion of right forearm, initial encounter: Secondary | ICD-10-CM | POA: Diagnosis not present

## 2015-06-23 DIAGNOSIS — F419 Anxiety disorder, unspecified: Secondary | ICD-10-CM | POA: Diagnosis not present

## 2015-06-23 DIAGNOSIS — M5117 Intervertebral disc disorders with radiculopathy, lumbosacral region: Secondary | ICD-10-CM | POA: Diagnosis not present

## 2015-06-23 DIAGNOSIS — M9903 Segmental and somatic dysfunction of lumbar region: Secondary | ICD-10-CM | POA: Diagnosis not present

## 2015-06-23 DIAGNOSIS — M9902 Segmental and somatic dysfunction of thoracic region: Secondary | ICD-10-CM | POA: Diagnosis not present

## 2015-06-23 DIAGNOSIS — M50321 Other cervical disc degeneration at C4-C5 level: Secondary | ICD-10-CM | POA: Diagnosis not present

## 2015-06-23 DIAGNOSIS — M9901 Segmental and somatic dysfunction of cervical region: Secondary | ICD-10-CM | POA: Diagnosis not present

## 2015-06-23 DIAGNOSIS — M5384 Other specified dorsopathies, thoracic region: Secondary | ICD-10-CM | POA: Diagnosis not present

## 2015-06-23 DIAGNOSIS — F320 Major depressive disorder, single episode, mild: Secondary | ICD-10-CM | POA: Diagnosis not present

## 2015-06-23 DIAGNOSIS — M9905 Segmental and somatic dysfunction of pelvic region: Secondary | ICD-10-CM | POA: Diagnosis not present

## 2015-06-23 DIAGNOSIS — Q72812 Congenital shortening of left lower limb: Secondary | ICD-10-CM | POA: Diagnosis not present

## 2015-06-23 DIAGNOSIS — G603 Idiopathic progressive neuropathy: Secondary | ICD-10-CM | POA: Diagnosis not present

## 2015-06-23 DIAGNOSIS — M9904 Segmental and somatic dysfunction of sacral region: Secondary | ICD-10-CM | POA: Diagnosis not present

## 2015-06-24 DIAGNOSIS — M9902 Segmental and somatic dysfunction of thoracic region: Secondary | ICD-10-CM | POA: Diagnosis not present

## 2015-06-24 DIAGNOSIS — M5117 Intervertebral disc disorders with radiculopathy, lumbosacral region: Secondary | ICD-10-CM | POA: Diagnosis not present

## 2015-06-24 DIAGNOSIS — M50321 Other cervical disc degeneration at C4-C5 level: Secondary | ICD-10-CM | POA: Diagnosis not present

## 2015-06-24 DIAGNOSIS — Q72812 Congenital shortening of left lower limb: Secondary | ICD-10-CM | POA: Diagnosis not present

## 2015-06-24 DIAGNOSIS — M9901 Segmental and somatic dysfunction of cervical region: Secondary | ICD-10-CM | POA: Diagnosis not present

## 2015-06-24 DIAGNOSIS — M5384 Other specified dorsopathies, thoracic region: Secondary | ICD-10-CM | POA: Diagnosis not present

## 2015-06-24 DIAGNOSIS — M9905 Segmental and somatic dysfunction of pelvic region: Secondary | ICD-10-CM | POA: Diagnosis not present

## 2015-06-24 DIAGNOSIS — M9904 Segmental and somatic dysfunction of sacral region: Secondary | ICD-10-CM | POA: Diagnosis not present

## 2015-06-24 DIAGNOSIS — M9903 Segmental and somatic dysfunction of lumbar region: Secondary | ICD-10-CM | POA: Diagnosis not present

## 2015-06-24 DIAGNOSIS — G603 Idiopathic progressive neuropathy: Secondary | ICD-10-CM | POA: Diagnosis not present

## 2015-06-25 DIAGNOSIS — M9901 Segmental and somatic dysfunction of cervical region: Secondary | ICD-10-CM | POA: Diagnosis not present

## 2015-06-25 DIAGNOSIS — M9903 Segmental and somatic dysfunction of lumbar region: Secondary | ICD-10-CM | POA: Diagnosis not present

## 2015-06-25 DIAGNOSIS — M50321 Other cervical disc degeneration at C4-C5 level: Secondary | ICD-10-CM | POA: Diagnosis not present

## 2015-06-25 DIAGNOSIS — M9902 Segmental and somatic dysfunction of thoracic region: Secondary | ICD-10-CM | POA: Diagnosis not present

## 2015-06-25 DIAGNOSIS — M5384 Other specified dorsopathies, thoracic region: Secondary | ICD-10-CM | POA: Diagnosis not present

## 2015-06-25 DIAGNOSIS — G603 Idiopathic progressive neuropathy: Secondary | ICD-10-CM | POA: Diagnosis not present

## 2015-06-25 DIAGNOSIS — M9905 Segmental and somatic dysfunction of pelvic region: Secondary | ICD-10-CM | POA: Diagnosis not present

## 2015-06-25 DIAGNOSIS — M5117 Intervertebral disc disorders with radiculopathy, lumbosacral region: Secondary | ICD-10-CM | POA: Diagnosis not present

## 2015-06-25 DIAGNOSIS — M9904 Segmental and somatic dysfunction of sacral region: Secondary | ICD-10-CM | POA: Diagnosis not present

## 2015-06-25 DIAGNOSIS — Q72812 Congenital shortening of left lower limb: Secondary | ICD-10-CM | POA: Diagnosis not present

## 2015-06-30 DIAGNOSIS — M9904 Segmental and somatic dysfunction of sacral region: Secondary | ICD-10-CM | POA: Diagnosis not present

## 2015-06-30 DIAGNOSIS — M9901 Segmental and somatic dysfunction of cervical region: Secondary | ICD-10-CM | POA: Diagnosis not present

## 2015-06-30 DIAGNOSIS — M9903 Segmental and somatic dysfunction of lumbar region: Secondary | ICD-10-CM | POA: Diagnosis not present

## 2015-06-30 DIAGNOSIS — Q72812 Congenital shortening of left lower limb: Secondary | ICD-10-CM | POA: Diagnosis not present

## 2015-06-30 DIAGNOSIS — G603 Idiopathic progressive neuropathy: Secondary | ICD-10-CM | POA: Diagnosis not present

## 2015-06-30 DIAGNOSIS — M9905 Segmental and somatic dysfunction of pelvic region: Secondary | ICD-10-CM | POA: Diagnosis not present

## 2015-06-30 DIAGNOSIS — M5384 Other specified dorsopathies, thoracic region: Secondary | ICD-10-CM | POA: Diagnosis not present

## 2015-06-30 DIAGNOSIS — M9902 Segmental and somatic dysfunction of thoracic region: Secondary | ICD-10-CM | POA: Diagnosis not present

## 2015-06-30 DIAGNOSIS — M50321 Other cervical disc degeneration at C4-C5 level: Secondary | ICD-10-CM | POA: Diagnosis not present

## 2015-06-30 DIAGNOSIS — M5117 Intervertebral disc disorders with radiculopathy, lumbosacral region: Secondary | ICD-10-CM | POA: Diagnosis not present

## 2015-07-02 DIAGNOSIS — G603 Idiopathic progressive neuropathy: Secondary | ICD-10-CM | POA: Diagnosis not present

## 2015-07-02 DIAGNOSIS — M50321 Other cervical disc degeneration at C4-C5 level: Secondary | ICD-10-CM | POA: Diagnosis not present

## 2015-07-02 DIAGNOSIS — M9905 Segmental and somatic dysfunction of pelvic region: Secondary | ICD-10-CM | POA: Diagnosis not present

## 2015-07-02 DIAGNOSIS — Q72812 Congenital shortening of left lower limb: Secondary | ICD-10-CM | POA: Diagnosis not present

## 2015-07-02 DIAGNOSIS — M9903 Segmental and somatic dysfunction of lumbar region: Secondary | ICD-10-CM | POA: Diagnosis not present

## 2015-07-02 DIAGNOSIS — M9902 Segmental and somatic dysfunction of thoracic region: Secondary | ICD-10-CM | POA: Diagnosis not present

## 2015-07-02 DIAGNOSIS — M5117 Intervertebral disc disorders with radiculopathy, lumbosacral region: Secondary | ICD-10-CM | POA: Diagnosis not present

## 2015-07-02 DIAGNOSIS — M5384 Other specified dorsopathies, thoracic region: Secondary | ICD-10-CM | POA: Diagnosis not present

## 2015-07-02 DIAGNOSIS — M9904 Segmental and somatic dysfunction of sacral region: Secondary | ICD-10-CM | POA: Diagnosis not present

## 2015-07-02 DIAGNOSIS — M9901 Segmental and somatic dysfunction of cervical region: Secondary | ICD-10-CM | POA: Diagnosis not present

## 2015-07-07 DIAGNOSIS — H353221 Exudative age-related macular degeneration, left eye, with active choroidal neovascularization: Secondary | ICD-10-CM | POA: Diagnosis not present

## 2015-07-09 DIAGNOSIS — Q72812 Congenital shortening of left lower limb: Secondary | ICD-10-CM | POA: Diagnosis not present

## 2015-07-09 DIAGNOSIS — M9902 Segmental and somatic dysfunction of thoracic region: Secondary | ICD-10-CM | POA: Diagnosis not present

## 2015-07-09 DIAGNOSIS — M9904 Segmental and somatic dysfunction of sacral region: Secondary | ICD-10-CM | POA: Diagnosis not present

## 2015-07-09 DIAGNOSIS — M5384 Other specified dorsopathies, thoracic region: Secondary | ICD-10-CM | POA: Diagnosis not present

## 2015-07-09 DIAGNOSIS — M9903 Segmental and somatic dysfunction of lumbar region: Secondary | ICD-10-CM | POA: Diagnosis not present

## 2015-07-09 DIAGNOSIS — M5117 Intervertebral disc disorders with radiculopathy, lumbosacral region: Secondary | ICD-10-CM | POA: Diagnosis not present

## 2015-07-09 DIAGNOSIS — M9901 Segmental and somatic dysfunction of cervical region: Secondary | ICD-10-CM | POA: Diagnosis not present

## 2015-07-09 DIAGNOSIS — M9905 Segmental and somatic dysfunction of pelvic region: Secondary | ICD-10-CM | POA: Diagnosis not present

## 2015-07-09 DIAGNOSIS — M50321 Other cervical disc degeneration at C4-C5 level: Secondary | ICD-10-CM | POA: Diagnosis not present

## 2015-07-09 DIAGNOSIS — G603 Idiopathic progressive neuropathy: Secondary | ICD-10-CM | POA: Diagnosis not present

## 2015-07-16 DIAGNOSIS — M9901 Segmental and somatic dysfunction of cervical region: Secondary | ICD-10-CM | POA: Diagnosis not present

## 2015-07-16 DIAGNOSIS — M9903 Segmental and somatic dysfunction of lumbar region: Secondary | ICD-10-CM | POA: Diagnosis not present

## 2015-07-16 DIAGNOSIS — E538 Deficiency of other specified B group vitamins: Secondary | ICD-10-CM | POA: Diagnosis not present

## 2015-07-16 DIAGNOSIS — M9905 Segmental and somatic dysfunction of pelvic region: Secondary | ICD-10-CM | POA: Diagnosis not present

## 2015-07-16 DIAGNOSIS — M0609 Rheumatoid arthritis without rheumatoid factor, multiple sites: Secondary | ICD-10-CM | POA: Diagnosis not present

## 2015-07-16 DIAGNOSIS — M5117 Intervertebral disc disorders with radiculopathy, lumbosacral region: Secondary | ICD-10-CM | POA: Diagnosis not present

## 2015-07-16 DIAGNOSIS — Q72812 Congenital shortening of left lower limb: Secondary | ICD-10-CM | POA: Diagnosis not present

## 2015-07-16 DIAGNOSIS — M7989 Other specified soft tissue disorders: Secondary | ICD-10-CM | POA: Diagnosis not present

## 2015-07-16 DIAGNOSIS — R51 Headache: Secondary | ICD-10-CM | POA: Diagnosis not present

## 2015-07-16 DIAGNOSIS — G603 Idiopathic progressive neuropathy: Secondary | ICD-10-CM | POA: Diagnosis not present

## 2015-07-16 DIAGNOSIS — M50321 Other cervical disc degeneration at C4-C5 level: Secondary | ICD-10-CM | POA: Diagnosis not present

## 2015-07-16 DIAGNOSIS — M2550 Pain in unspecified joint: Secondary | ICD-10-CM | POA: Diagnosis not present

## 2015-07-16 DIAGNOSIS — R5383 Other fatigue: Secondary | ICD-10-CM | POA: Diagnosis not present

## 2015-07-16 DIAGNOSIS — M150 Primary generalized (osteo)arthritis: Secondary | ICD-10-CM | POA: Diagnosis not present

## 2015-07-16 DIAGNOSIS — G629 Polyneuropathy, unspecified: Secondary | ICD-10-CM | POA: Diagnosis not present

## 2015-07-16 DIAGNOSIS — M5384 Other specified dorsopathies, thoracic region: Secondary | ICD-10-CM | POA: Diagnosis not present

## 2015-07-16 DIAGNOSIS — M810 Age-related osteoporosis without current pathological fracture: Secondary | ICD-10-CM | POA: Diagnosis not present

## 2015-07-16 DIAGNOSIS — Z79899 Other long term (current) drug therapy: Secondary | ICD-10-CM | POA: Diagnosis not present

## 2015-07-16 DIAGNOSIS — M9904 Segmental and somatic dysfunction of sacral region: Secondary | ICD-10-CM | POA: Diagnosis not present

## 2015-07-16 DIAGNOSIS — M9902 Segmental and somatic dysfunction of thoracic region: Secondary | ICD-10-CM | POA: Diagnosis not present

## 2015-07-21 ENCOUNTER — Emergency Department (HOSPITAL_COMMUNITY): Payer: Medicare Other

## 2015-07-21 ENCOUNTER — Observation Stay (HOSPITAL_COMMUNITY)
Admission: EM | Admit: 2015-07-21 | Discharge: 2015-07-22 | Disposition: A | Discharge status: Home / Self Care | Payer: Medicare Other | Attending: Internal Medicine | Admitting: Internal Medicine

## 2015-07-21 ENCOUNTER — Encounter (HOSPITAL_COMMUNITY): Payer: Self-pay | Admitting: Emergency Medicine

## 2015-07-21 DIAGNOSIS — Y92003 Bedroom of unspecified non-institutional (private) residence as the place of occurrence of the external cause: Secondary | ICD-10-CM | POA: Diagnosis not present

## 2015-07-21 DIAGNOSIS — S199XXA Unspecified injury of neck, initial encounter: Secondary | ICD-10-CM | POA: Diagnosis not present

## 2015-07-21 DIAGNOSIS — S3991XA Unspecified injury of abdomen, initial encounter: Secondary | ICD-10-CM | POA: Diagnosis not present

## 2015-07-21 DIAGNOSIS — S32019A Unspecified fracture of first lumbar vertebra, initial encounter for closed fracture: Secondary | ICD-10-CM | POA: Diagnosis not present

## 2015-07-21 DIAGNOSIS — S79911A Unspecified injury of right hip, initial encounter: Secondary | ICD-10-CM | POA: Diagnosis not present

## 2015-07-21 DIAGNOSIS — I10 Essential (primary) hypertension: Secondary | ICD-10-CM | POA: Diagnosis not present

## 2015-07-21 DIAGNOSIS — S32010A Wedge compression fracture of first lumbar vertebra, initial encounter for closed fracture: Secondary | ICD-10-CM

## 2015-07-21 DIAGNOSIS — M069 Rheumatoid arthritis, unspecified: Secondary | ICD-10-CM

## 2015-07-21 DIAGNOSIS — M1990 Unspecified osteoarthritis, unspecified site: Secondary | ICD-10-CM | POA: Insufficient documentation

## 2015-07-21 DIAGNOSIS — F329 Major depressive disorder, single episode, unspecified: Secondary | ICD-10-CM | POA: Diagnosis not present

## 2015-07-21 DIAGNOSIS — N3289 Other specified disorders of bladder: Secondary | ICD-10-CM | POA: Diagnosis not present

## 2015-07-21 DIAGNOSIS — Z79891 Long term (current) use of opiate analgesic: Secondary | ICD-10-CM | POA: Diagnosis not present

## 2015-07-21 DIAGNOSIS — S0990XA Unspecified injury of head, initial encounter: Secondary | ICD-10-CM | POA: Diagnosis not present

## 2015-07-21 DIAGNOSIS — R296 Repeated falls: Secondary | ICD-10-CM | POA: Diagnosis not present

## 2015-07-21 DIAGNOSIS — S3690XA Unspecified injury of unspecified intra-abdominal organ, initial encounter: Secondary | ICD-10-CM | POA: Diagnosis not present

## 2015-07-21 DIAGNOSIS — G4700 Insomnia, unspecified: Secondary | ICD-10-CM

## 2015-07-21 DIAGNOSIS — N390 Urinary tract infection, site not specified: Secondary | ICD-10-CM

## 2015-07-21 DIAGNOSIS — G629 Polyneuropathy, unspecified: Secondary | ICD-10-CM | POA: Diagnosis not present

## 2015-07-21 DIAGNOSIS — R52 Pain, unspecified: Secondary | ICD-10-CM

## 2015-07-21 DIAGNOSIS — S0083XA Contusion of other part of head, initial encounter: Secondary | ICD-10-CM | POA: Diagnosis not present

## 2015-07-21 DIAGNOSIS — Z7982 Long term (current) use of aspirin: Secondary | ICD-10-CM | POA: Insufficient documentation

## 2015-07-21 DIAGNOSIS — I2510 Atherosclerotic heart disease of native coronary artery without angina pectoris: Secondary | ICD-10-CM | POA: Insufficient documentation

## 2015-07-21 DIAGNOSIS — M25551 Pain in right hip: Secondary | ICD-10-CM | POA: Diagnosis not present

## 2015-07-21 DIAGNOSIS — Z6829 Body mass index (BMI) 29.0-29.9, adult: Secondary | ICD-10-CM | POA: Diagnosis not present

## 2015-07-21 DIAGNOSIS — R5381 Other malaise: Secondary | ICD-10-CM

## 2015-07-21 DIAGNOSIS — R1030 Lower abdominal pain, unspecified: Secondary | ICD-10-CM | POA: Diagnosis not present

## 2015-07-21 DIAGNOSIS — Z89422 Acquired absence of other left toe(s): Secondary | ICD-10-CM | POA: Insufficient documentation

## 2015-07-21 DIAGNOSIS — Z96652 Presence of left artificial knee joint: Secondary | ICD-10-CM | POA: Insufficient documentation

## 2015-07-21 DIAGNOSIS — W19XXXA Unspecified fall, initial encounter: Secondary | ICD-10-CM | POA: Diagnosis not present

## 2015-07-21 DIAGNOSIS — S0993XA Unspecified injury of face, initial encounter: Secondary | ICD-10-CM | POA: Diagnosis not present

## 2015-07-21 DIAGNOSIS — E440 Moderate protein-calorie malnutrition: Secondary | ICD-10-CM | POA: Insufficient documentation

## 2015-07-21 DIAGNOSIS — T148XXA Other injury of unspecified body region, initial encounter: Secondary | ICD-10-CM

## 2015-07-21 DIAGNOSIS — Z79899 Other long term (current) drug therapy: Secondary | ICD-10-CM | POA: Insufficient documentation

## 2015-07-21 DIAGNOSIS — M79606 Pain in leg, unspecified: Secondary | ICD-10-CM | POA: Diagnosis present

## 2015-07-21 DIAGNOSIS — S299XXA Unspecified injury of thorax, initial encounter: Secondary | ICD-10-CM | POA: Diagnosis not present

## 2015-07-21 DIAGNOSIS — R1032 Left lower quadrant pain: Secondary | ICD-10-CM | POA: Diagnosis not present

## 2015-07-21 DIAGNOSIS — E441 Mild protein-calorie malnutrition: Secondary | ICD-10-CM | POA: Insufficient documentation

## 2015-07-21 HISTORY — DX: Family history of other specified conditions: Z84.89

## 2015-07-21 HISTORY — DX: Urinary tract infection, site not specified: N39.0

## 2015-07-21 HISTORY — DX: Other specified disorders of bladder: N32.89

## 2015-07-21 HISTORY — DX: Rheumatoid arthritis, unspecified: M06.9

## 2015-07-21 HISTORY — DX: Unspecified fall, initial encounter: W19.XXXA

## 2015-07-21 HISTORY — DX: Unspecified place in unspecified non-institutional (private) residence as the place of occurrence of the external cause: Y92.009

## 2015-07-21 HISTORY — DX: Polyneuropathy, unspecified: G62.9

## 2015-07-21 LAB — PROTIME-INR
INR: 1.01 (ref 0.00–1.49)
PROTHROMBIN TIME: 13.5 s (ref 11.6–15.2)

## 2015-07-21 LAB — URINALYSIS, ROUTINE W REFLEX MICROSCOPIC
BILIRUBIN URINE: NEGATIVE
GLUCOSE, UA: NEGATIVE mg/dL
Ketones, ur: NEGATIVE mg/dL
Nitrite: POSITIVE — AB
PH: 7.5 (ref 5.0–8.0)
Protein, ur: 30 mg/dL — AB
SPECIFIC GRAVITY, URINE: 1.014 (ref 1.005–1.030)

## 2015-07-21 LAB — BASIC METABOLIC PANEL
ANION GAP: 7 (ref 5–15)
BUN: 17 mg/dL (ref 6–20)
CALCIUM: 9.7 mg/dL (ref 8.9–10.3)
CO2: 27 mmol/L (ref 22–32)
Chloride: 106 mmol/L (ref 101–111)
Creatinine, Ser: 0.62 mg/dL (ref 0.44–1.00)
GLUCOSE: 94 mg/dL (ref 65–99)
Potassium: 3.8 mmol/L (ref 3.5–5.1)
Sodium: 140 mmol/L (ref 135–145)

## 2015-07-21 LAB — URINE MICROSCOPIC-ADD ON

## 2015-07-21 LAB — CBC WITH DIFFERENTIAL/PLATELET
BASOS ABS: 0 10*3/uL (ref 0.0–0.1)
BASOS PCT: 0 %
Eosinophils Absolute: 0.1 10*3/uL (ref 0.0–0.7)
Eosinophils Relative: 1 %
HEMATOCRIT: 42.7 % (ref 36.0–46.0)
Hemoglobin: 13.5 g/dL (ref 12.0–15.0)
Lymphocytes Relative: 13 %
Lymphs Abs: 1.2 10*3/uL (ref 0.7–4.0)
MCH: 30.2 pg (ref 26.0–34.0)
MCHC: 31.6 g/dL (ref 30.0–36.0)
MCV: 95.5 fL (ref 78.0–100.0)
MONO ABS: 1 10*3/uL (ref 0.1–1.0)
Monocytes Relative: 10 %
NEUTROS ABS: 7.2 10*3/uL (ref 1.7–7.7)
NEUTROS PCT: 76 %
Platelets: 217 10*3/uL (ref 150–400)
RBC: 4.47 MIL/uL (ref 3.87–5.11)
RDW: 14.3 % (ref 11.5–15.5)
WBC: 9.5 10*3/uL (ref 4.0–10.5)

## 2015-07-21 LAB — TYPE AND SCREEN
ABO/RH(D): A POS
Antibody Screen: NEGATIVE

## 2015-07-21 LAB — CK: CK TOTAL: 93 U/L (ref 38–234)

## 2015-07-21 MED ORDER — ONDANSETRON HCL 4 MG PO TABS: 4.0000 mg | ORAL_TABLET | Freq: Four times a day (QID) | ORAL | Status: DC | PRN

## 2015-07-21 MED ORDER — METHOTREXATE 2.5 MG PO TABS
10.0000 mg | ORAL_TABLET | ORAL | Status: DC
  Administered 2015-07-22: 10 mg via ORAL
  Filled 2015-07-21: qty 4

## 2015-07-21 MED ORDER — CEFTRIAXONE SODIUM 1 G IJ SOLR
1.0000 g | Freq: Once | INTRAMUSCULAR | Status: AC
  Administered 2015-07-21: 1 g via INTRAVENOUS
  Filled 2015-07-21: qty 10

## 2015-07-21 MED ORDER — SODIUM CHLORIDE 0.9 % IV SOLN
INTRAVENOUS | Status: DC
Start: 2015-07-21 — End: 2015-07-21
  Administered 2015-07-21: 04:00:00 via INTRAVENOUS

## 2015-07-21 MED ORDER — ENOXAPARIN SODIUM 40 MG/0.4ML ~~LOC~~ SOLN
40.0000 mg | SUBCUTANEOUS | Status: DC
  Administered 2015-07-21 – 2015-07-22 (×2): 40 mg via SUBCUTANEOUS
  Filled 2015-07-21 (×2): qty 0.4

## 2015-07-21 MED ORDER — MELATONIN 3 MG PO TABS
7.5000 mg | ORAL_TABLET | Freq: Every day | ORAL | Status: DC
  Administered 2015-07-21: 7.5 mg via ORAL
  Filled 2015-07-21 (×2): qty 2.5

## 2015-07-21 MED ORDER — ASPIRIN EC 81 MG PO TBEC
81.0000 mg | DELAYED_RELEASE_TABLET | Freq: Every day | ORAL | Status: DC
  Administered 2015-07-21 – 2015-07-22 (×2): 81 mg via ORAL
  Filled 2015-07-21 (×2): qty 1

## 2015-07-21 MED ORDER — CEFTRIAXONE SODIUM 1 G IJ SOLR
1.0000 g | INTRAMUSCULAR | Status: DC
  Administered 2015-07-22: 1 g via INTRAVENOUS
  Filled 2015-07-21 (×2): qty 10

## 2015-07-21 MED ORDER — ENSURE ENLIVE PO LIQD
237.0000 mL | Freq: Two times a day (BID) | ORAL | Status: DC
  Administered 2015-07-21 – 2015-07-22 (×4): 237 mL via ORAL

## 2015-07-21 MED ORDER — IOPAMIDOL (ISOVUE-300) INJECTION 61%
INTRAVENOUS | Status: AC
  Administered 2015-07-21: 75 mL via INTRAVENOUS
  Filled 2015-07-21: qty 75

## 2015-07-21 MED ORDER — BUPROPION HCL ER (SR) 150 MG PO TB12
450.0000 mg | ORAL_TABLET | Freq: Every day | ORAL | Status: DC
  Administered 2015-07-22: 450 mg via ORAL
  Filled 2015-07-21 (×4): qty 3

## 2015-07-21 MED ORDER — SODIUM CHLORIDE 0.9 % IV SOLN
INTRAVENOUS | Status: DC
  Administered 2015-07-21 – 2015-07-22 (×3): via INTRAVENOUS

## 2015-07-21 MED ORDER — ONDANSETRON HCL 4 MG/2ML IJ SOLN: 4.0000 mg | Freq: Four times a day (QID) | INTRAMUSCULAR | Status: DC | PRN

## 2015-07-21 MED ORDER — PHENAZOPYRIDINE HCL 100 MG PO TABS
100.0000 mg | ORAL_TABLET | Freq: Three times a day (TID) | ORAL | Status: DC | PRN
  Administered 2015-07-21: 100 mg via ORAL
  Filled 2015-07-21: qty 1

## 2015-07-21 MED ORDER — FENTANYL CITRATE (PF) 100 MCG/2ML IJ SOLN
50.0000 ug | INTRAMUSCULAR | Status: AC | PRN
  Administered 2015-07-21 (×2): 50 ug via INTRAVENOUS
  Filled 2015-07-21 (×2): qty 2

## 2015-07-21 MED ORDER — MIRABEGRON ER 50 MG PO TB24
50.0000 mg | ORAL_TABLET | Freq: Every day | ORAL | Status: DC
Start: 2015-07-21 — End: 2015-07-22
  Administered 2015-07-21: 50 mg via ORAL
  Filled 2015-07-21 (×2): qty 1

## 2015-07-21 MED ORDER — GABAPENTIN 100 MG PO CAPS
100.0000 mg | ORAL_CAPSULE | Freq: Two times a day (BID) | ORAL | Status: DC
  Administered 2015-07-21 – 2015-07-22 (×3): 100 mg via ORAL
  Filled 2015-07-21 (×3): qty 1

## 2015-07-21 MED ORDER — DICLOFENAC SODIUM 1 % TD GEL: 4.0000 g | Freq: Four times a day (QID) | TRANSDERMAL | Status: DC | PRN

## 2015-07-21 MED ORDER — DULOXETINE HCL 60 MG PO CPEP
60.0000 mg | ORAL_CAPSULE | Freq: Every day | ORAL | Status: DC
  Administered 2015-07-21 – 2015-07-22 (×2): 60 mg via ORAL
  Filled 2015-07-21 (×2): qty 1

## 2015-07-21 MED ORDER — OXYCODONE HCL 5 MG PO TABS
2.5000 mg | ORAL_TABLET | Freq: Four times a day (QID) | ORAL | Status: DC | PRN
  Administered 2015-07-21 – 2015-07-22 (×3): 5 mg via ORAL
  Filled 2015-07-21 (×3): qty 1

## 2015-07-21 MED ORDER — CALCITONIN (SALMON) 200 UNIT/ACT NA SOLN
1.0000 | Freq: Every day | NASAL | Status: DC
  Administered 2015-07-21 – 2015-07-22 (×2): 1 via NASAL
  Filled 2015-07-21 (×2): qty 3.7
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
## 2015-07-21 NOTE — Progress Notes (Signed)
OT Cancellation Note  Patient Details Name: Tracy Sharp MRN: [id number] DOB: August 20, 1924   Cancelled Treatment:    Reason Eval/Treat Not Completed: Other (comment) Pt with bedrest orders. Please update activity orders when appropriate to initiate therapy. Thanks Reyaan Thoma,HILLARY 07/21/2015, 2:00 PM

## 2015-07-21 NOTE — ED Notes (Signed)
Pt ambulated in hall with walker and assistance
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
## 2015-07-21 NOTE — Care Management Note (Addendum)
Case Management Note  Patient Details  Name: KENNIE KARAPETIAN MRN: [id number] Date of Birth: 1924/12/31  Subjective/Objective:                 Spoke to patient at the bedside. She lives at home by herself with her son and DIL a mile away who check on her often. Patient has walker. Interested in SNF at [place] however no qualifying stay in past 30 days.    Action/Plan:  Anticipating DC to home with [facility] services maximized if no 3 day inpatient stay prior to DC.  Addendum- 07-22-15 Referral made to [facility] for [facility] PT OT RN [facility]   Expected Discharge Date:  07/23/15               Expected Discharge Plan:  [place]  In-House Referral:  Clinical Social Work  Discharge planning Services  CM Consult  Post Acute Care Choice:    Choice offered to:     DME Arranged:    DME Agency:     HH Arranged:    [place] Agency:     Status of Service:  In process, will continue to follow  If discussed at Long Length of Stay Meetings, dates discussed:    Additional Comments:  Carles Collet, RN 07/21/2015, 3:05 PM

## 2015-07-21 NOTE — Care Management Note (Signed)
Case Management Note  Patient Details  Name: RAHI CHANDONNET MRN: [id number] Date of Birth: 02-01-1924  Subjective/Objective:                  82 y.o. female who presents to the Emergency Department for evaluation after a fall at home approximately 4 hours PTA./ From home- daughter-in-law [name].  Action/Plan: Follow for disposition needs.   Expected Discharge Date:  07/23/15               Expected Discharge Plan:  [place]  In-House Referral:  Clinical Social Work  Discharge planning Services  CM Consult  Post Acute Care Choice:    Choice offered to:     DME Arranged:    DME Agency:     HH Arranged:    [place] Agency:     Status of Service:  In process, will continue to follow  Medicare Important Message Given:    Date Medicare IM Given:    Medicare IM give by:    Date Additional Medicare IM Given:    Additional Medicare Important Message give by:     If discussed at [place] of Stay Meetings, dates discussed:    Additional Comments:  Fuller Mandril, RN 07/21/2015, 10:21 AM
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
## 2015-07-21 NOTE — Care Management Obs Status (Signed)
[facility] NOTIFICATION   Patient Details  Name: Tracy Sharp MRN: [id number] Date of Birth: Jan 04, 1925   Medicare Observation Status Notification Given:  Yes    Carles Collet, RN 07/21/2015, 2:40 PM
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
## 2015-07-21 NOTE — ED Provider Notes (Signed)
Discussed with [facility]. Will admit to med-surg bed. IV abx given in ED.   Julianne Rice, MD 07/21/15 1007

## 2015-07-22 DIAGNOSIS — M069 Rheumatoid arthritis, unspecified: Secondary | ICD-10-CM

## 2015-07-22 DIAGNOSIS — W19XXXD Unspecified fall, subsequent encounter: Secondary | ICD-10-CM

## 2015-07-22 DIAGNOSIS — E440 Moderate protein-calorie malnutrition: Secondary | ICD-10-CM | POA: Diagnosis not present

## 2015-07-22 DIAGNOSIS — S32010D Wedge compression fracture of first lumbar vertebra, subsequent encounter for fracture with routine healing: Secondary | ICD-10-CM

## 2015-07-22 DIAGNOSIS — G6289 Other specified polyneuropathies: Secondary | ICD-10-CM | POA: Diagnosis not present

## 2015-07-22 DIAGNOSIS — S32019A Unspecified fracture of first lumbar vertebra, initial encounter for closed fracture: Secondary | ICD-10-CM | POA: Diagnosis not present

## 2015-07-22 DIAGNOSIS — N390 Urinary tract infection, site not specified: Secondary | ICD-10-CM

## 2015-07-22 DIAGNOSIS — E441 Mild protein-calorie malnutrition: Secondary | ICD-10-CM | POA: Insufficient documentation

## 2015-07-22 LAB — BASIC METABOLIC PANEL
ANION GAP: 6 (ref 5–15)
BUN: 13 mg/dL (ref 6–20)
CHLORIDE: 106 mmol/L (ref 101–111)
CO2: 28 mmol/L (ref 22–32)
CREATININE: 0.57 mg/dL (ref 0.44–1.00)
Calcium: 8.9 mg/dL (ref 8.9–10.3)
GFR calc Af Amer: >60 mL/min (ref >60)
GFR calc non Af Amer: >60 mL/min (ref >60)
GLUCOSE: 96 mg/dL (ref 65–99)
POTASSIUM: 3.3 mmol/L — AB (ref 3.5–5.1)
Sodium: 140 mmol/L (ref 135–145)

## 2015-07-22 LAB — CBC
HEMATOCRIT: 38.8 % (ref 36.0–46.0)
HEMOGLOBIN: 12.1 g/dL (ref 12.0–15.0)
MCH: 30 pg (ref 26.0–34.0)
MCHC: 31.2 g/dL (ref 30.0–36.0)
MCV: 96.3 fL (ref 78.0–100.0)
Platelets: 191 10*3/uL (ref 150–400)
RBC: 4.03 MIL/uL (ref 3.87–5.11)
RDW: 14.3 % (ref 11.5–15.5)
WBC: 5.6 10*3/uL (ref 4.0–10.5)

## 2015-07-22 LAB — CK: Total CK: 113 U/L (ref 38–234)

## 2015-07-22 MED ORDER — CEFUROXIME AXETIL 500 MG PO TABS: 500.0000 mg | ORAL_TABLET | Freq: Two times a day (BID) | ORAL | Status: AC

## 2015-07-22 MED ORDER — DICLOFENAC SODIUM 1 % TD GEL: 4.0000 g | Freq: Four times a day (QID) | TRANSDERMAL | Status: DC | PRN

## 2015-07-22 MED ORDER — ACETAMINOPHEN 500 MG PO TABS
1000.0000 mg | ORAL_TABLET | Freq: Three times a day (TID) | ORAL | Status: DC
Start: 2015-07-22 — End: 2016-07-28

## 2015-07-22 MED ORDER — CALCITONIN (SALMON) 200 UNIT/ACT NA SOLN: 1.0000 | Freq: Every day | NASAL | Status: DC

## 2015-07-22 MED ORDER — GABAPENTIN 100 MG PO CAPS: 100.0000 mg | ORAL_CAPSULE | Freq: Three times a day (TID) | ORAL | Status: DC

## 2015-07-22 NOTE — Progress Notes (Signed)
CSW spoke with pt and pt dtr-in-law at bedside concerning PT recommendation for SNF.  They are aware pt does not meet 3 night Medicare stay but was wondering if pt could be placed under [facility]- CSW called [facility] and confirmed they have not started program that allows pts to go to SNF without 3 night stay.  RNCM also in meeting and provided information regarding home health aids and ALF assistance for future placement needs  No further CSW needs identified- CSW signing off  Domenica Reamer, [place] Worker [phone]

## 2015-07-22 NOTE — Evaluation (Signed)
Physical Therapy Evaluation Patient Details Name: Tracy Sharp MRN: [id number] DOB: 1924-04-06 Today's Date: 07/22/2015   History of Present Illness  Patient is a 82 y/o female with hx of nonobstructive CAD, HLD, HTN, IBS, depression/anxiety and RA presents s/p fall at home. Found to have compression fracture at L1, UTI.  Clinical Impression  Patient presents with generalized weakness, balance deficits, decreased endurance and multiple falls at home impacting safe mobility. Requires hands on assist (Min-Mod A) for transfers and gait training due to instability. Pt lives alone and her daughter in law has been helping with ADLs and IADLs but she is not able to provide this level of care. Pt is a HIGH FALL RISK.  Would really benefit from short term SNF to maximize independence, decrease fall risk and improve mobility. Discussed considering ALF for more assist.    Follow Up Recommendations SNF    Equipment Recommendations  None recommended by PT    Recommendations for Other Services OT consult     Precautions / Restrictions Precautions Precautions: Fall;Back Precaution Booklet Issued: No Restrictions Weight Bearing Restrictions: No      Mobility  Bed Mobility               General bed mobility comments: UP in chair upon PT arrival.   Transfers Overall transfer level: Needs assistance Equipment used: Rolling walker (2 wheeled) Transfers: Sit to/from Stand Sit to Stand: Mod assist         General transfer comment: Mod A to boost from chair x1, from [facility] x1 with cues for hand placement/technique. posterior bias. Increased time and difficulty.   Ambulation/Gait Ambulation/Gait assistance: Min assist Ambulation Distance (Feet): 16 Feet (x2 bouts) Assistive device: Rolling walker (2 wheeled) Gait Pattern/deviations: Step-to pattern;Step-through pattern;Decreased stride length;Trunk flexed Gait velocity: decreased   General Gait Details: Slow, unsteady gait, difficulty  manuevering around obstabcles in room, assist with RW management.  Stairs            Wheelchair Mobility    Modified Rankin (Stroke Patients Only)       Balance Overall balance assessment: Needs assistance;History of Falls Sitting-balance support: Feet supported;No upper extremity supported Sitting balance-Leahy Scale: Fair     Standing balance support: During functional activity Standing balance-Leahy Scale: Poor Standing balance comment: Min-Mod A standing when trying to donn/doff underwear and for pericare, LOB posteriorly and LEs bracing on BSC. NOt able to stand without UE support. LOB x4                             Pertinent Vitals/Pain Pain Assessment: Faces Faces Pain Scale: Hurts even more Pain Location: everywhere Pain Descriptors / Indicators: Sore Pain Intervention(s): Monitored during session    Home Living Family/patient expects to be discharged to:: Private residence Living Arrangements: Alone Available Help at Discharge: Family;Available PRN/intermittently Type of Home: House Home Access: Ramped entrance     Home Layout: One level Home Equipment: Bedside commode;Walker - 2 wheels;Grab bars - toilet;Shower seat (lift chair)      Prior Function Level of Independence: Needs assistance   Gait / Transfers Assistance Needed: Reports 6 falls in 6 weeks. Uses rollator for ambulation.  ADL's / Homemaking Assistance Needed: states daughter helps her into shower and she sits and bathes; assist with dressing, meals etc.         Hand Dominance   Dominant Hand: Right    Extremity/Trunk Assessment   Upper Extremity Assessment: Defer to OT  evaluation           Lower Extremity Assessment: Generalized weakness         Communication   Communication: HOH (hearing aids)  Cognition Arousal/Alertness: Awake/alert Behavior During Therapy: WFL for tasks assessed/performed Overall Cognitive Status: Within Functional Limits for tasks  assessed                      General Comments General comments (skin integrity, edema, etc.): Daughter in law present during end of session. Not able to provide necessary level of care for home. Reports worsening weakness/falls PTA. Discussed ALF post rehab.    Exercises        Assessment/Plan    PT Assessment Patient needs continued PT services  PT Diagnosis Difficulty walking;Generalized weakness   PT Problem List Decreased strength;Cardiopulmonary status limiting activity;Decreased activity tolerance;Decreased balance;Decreased mobility;Decreased knowledge of use of DME;Decreased safety awareness  PT Treatment Interventions Balance training;Gait training;Functional mobility training;Therapeutic activities;Therapeutic exercise;Patient/family education;DME instruction   PT Goals (Current goals can be found in the Care Plan section) Acute Rehab PT Goals Patient Stated Goal: to get stronger PT Goal Formulation: With patient Time For Goal Achievement: 08/05/15 Potential to Achieve Goals: Good    Frequency Min 2X/week   Barriers to discharge Decreased caregiver support lives alone    Co-evaluation               End of Session Equipment Utilized During Treatment: Gait belt Activity Tolerance: Patient limited by fatigue Patient left: in chair;with call bell/phone within reach;with chair alarm set;with family/visitor present Nurse Communication: Mobility status    Functional Assessment Tool Used: clinical judgment Functional Limitation: Mobility: Walking and moving around Mobility: Walking and Moving Around Current Status [phone]): At least 40 percent but less than 60 percent impaired, limited or restricted Mobility: Walking and Moving Around Goal Status [phone]): At least 20 percent but less than 40 percent impaired, limited or restricted    Time: 3559-7416 PT Time Calculation (min) (ACUTE ONLY): 36 min   Charges:   PT Evaluation $PT Eval Moderate Complexity:  1 Procedure PT Treatments $Gait Training: 8-22 mins   PT G Codes:   PT G-Codes **NOT FOR INPATIENT CLASS** Functional Assessment Tool Used: clinical judgment Functional Limitation: Mobility: Walking and moving around Mobility: Walking and Moving Around Current Status (L8453): At least 40 percent but less than 60 percent impaired, limited or restricted Mobility: Walking and Moving Around Goal Status [phone]): At least 20 percent but less than 40 percent impaired, limited or restricted    Angeligue Bowne A Tanekia Ryans 07/22/2015, 2:59 PM  Wray Kearns, [place], DPT [phone]

## 2015-07-22 NOTE — Discharge Summary (Signed)
Physician Discharge Summary  CANDISS GALEANA QQV:[id number] DOB: [birth year] DOA: 07/21/2015  PCP: Vidal Schwalbe, MD  Admit date: 07/21/2015 Discharge date: 07/22/2015  Time spent:  35 minutes  Recommendations for Outpatient Follow-up:  Repeat BMET to follow electrolytes and renal function  Assess resolution/control of pain in her lower back  Discharge Diagnoses:  Active Problems:   Fall   UTI (urinary tract infection)   Intractable pain   Physical deconditioning   Compression fracture of L1 lumbar vertebra (HCC)   Rheumatoid arthritis (HCC)   Bladder spasm   Peripheral neuropathy (HCC)   Insomnia   Malnutrition of moderate degree   Discharge Condition: stable and improved. Discharge home with [facility] services and instructions to follow up with PCP in 10 days  Diet recommendation: regular diet   Filed Weights   07/21/15 1131  Weight: 80.9 kg (178 lb 5.6 oz)    History of present illness:  As per H&P written by Dr. Marily Memos on 07/21/15 82 y.o. female with medical history significant of nonobstructive CAD, HLD, HTN, IBS, depression/anxiety and rheumatoid arthritis; who presented to ED after a fall at home (in her bedroom) and inability to get up. Patient reports getting up out of bed to go use the bathroom late night prior to admission. She lost power in her house and was unable to see very well. Patient reports falling and striking her chin on furniture in the house. After patient fell to the ground she experienced significant pain in her shin and hip and lower back. Denies any LOC, dizziness, palpitations, chest pain, shortness of breath, fevers, nausea, vomiting. Patient does endorse recent dysuria and frequency. Per family report patient has had multiple falls in the last several weeks. She's been in and out of physical therapy which seems to help but only for limited amount of time. Patient was admitted for pain control and to be assess by PT.  Hospital Course:  1-Mechanical  Fall -patient found with debility and physical deconditioning by PT; but unable to be place into SNF for rehab as only meeting observation status and family w/o ability to pay out of pocket. -[place] services has been arranged, including SW for potential placement from outpatient setting -patient advise to use tylenol TID (schedule) to help with pain and will continue the use of neurontin and PRN oxycodone for severe pain. -needs 24/7 supervision and assistance.  2-UTI: no complicated -patient w/o fever and WBC's WNL -will treat with ceftin BID for 5 days  3-L1 compression fracture: age indeterminate -will continue vit D -PRN analgesics -PT/OT and use of nasal calcitonin  4-RA -continue methotrexate and PRN pain meds  5-peripheral neuropathy: -continue neurontin  6-moderate protein calorie malnutrition -will continue feeding supplements and has been advise to maintain adequate hydration   7-depression -continue cymbalta        Procedures:  See below for x-ray reports   Consultations:  None   Discharge Exam: Filed Vitals:   07/22/15 0557 07/22/15 1449  BP: 119/55 110/60  Pulse: 76 78  Temp: 98 F (36.7 C) 97.4 F (36.3 C)  Resp: 18 19    General: afebrile, AAOX3, with some pain in lower back. No CP or SOB. Cardiovascular: S1 and S2, no rubs, no gallops, no JVD Respiratory: good air movement, no wheezing or crackles Abd: soft, NT, ND, positive BS Extremities: with some bruises in her LE, no open wounds. Patient reports pain to palpation on her lower back. Some deformities appreciated on her hands from RA Skin: with  multiple bruises on her face/chin after fall. Also with bruises on her LE's.  Discharge Instructions   Discharge Instructions    Discharge instructions    Complete by:  As directed   Be very careful when transferring and follow recommendations from physical therapy Keep yourself well hydrated Take medications as prescribed Follow heart healthy diet           Current Discharge Medication List    START taking these medications   Details  calcitonin, salmon, (MIACALCIN/FORTICAL) 200 UNIT/ACT nasal spray Place 1 spray into alternate nostrils daily. Qty: 3.7 mL, Refills: 2    cefUROXime (CEFTIN) 500 MG tablet Take 1 tablet (500 mg total) by mouth 2 (two) times daily with a meal. Qty: 10 tablet, Refills: 0    diclofenac sodium (VOLTAREN) 1 % GEL Apply 4 g topically 4 (four) times daily as needed (pain). Qty: 100 g, Refills: 1      CONTINUE these medications which have CHANGED   Details  acetaminophen (TYLENOL) 500 MG tablet Take 2 tablets (1,000 mg total) by mouth every 8 (eight) hours.    gabapentin (NEURONTIN) 100 MG capsule Take 1 capsule (100 mg total) by mouth 3 (three) times daily. Qty: 90 capsule, Refills: 0      CONTINUE these medications which have NOT CHANGED   Details  aspirin 81 MG tablet Take 81 mg by mouth daily.      buPROPion (WELLBUTRIN SR) 150 MG 12 hr tablet Take 450 mg by mouth daily.    cholecalciferol (VITAMIN D) 1000 UNITS tablet Take 1,000 Units by mouth daily.     DULoxetine (CYMBALTA) 60 MG capsule Take 60 mg by mouth daily.    feeding supplement, ENSURE ENLIVE, (ENSURE ENLIVE) LIQD Take 237 mLs by mouth 2 (two) times daily between meals. Qty: 237 mL, Refills: 12    folic acid (FOLVITE) 1 MG tablet Take 1 mg by mouth daily.    LUTEIN-ZEAXANTHIN PO Take 1 tablet by mouth daily.    Melatonin 5 MG TABS Take 7.5 mg by mouth at bedtime.    methotrexate (RHEUMATREX) 2.5 MG tablet Take 10 mg by mouth once a week. 10 mg ( 4 tablets ) Refills: 2    mirabegron ER (MYRBETRIQ) 50 MG TB24 tablet Take 50 mg by mouth at bedtime.     oxyCODONE (OXY IR/ROXICODONE) 5 MG immediate release tablet Take 2.5-5 mg by mouth every 6 (six) hours as needed for severe pain.       Allergies  Allergen Reactions  . Norco [Hydrocodone-Acetaminophen]     Nausea, dizziness  . Toviaz [Fesoterodine Fumarate Er]      dizzy  . Plaquenil [Hydroxychloroquine] Rash   Follow-up Information    Follow up with [facility].   Why:  For [facility] services PT OT RN Aide. They will call in 1-2 days after DC o set up first home visit.   Contact information:   [address] [place] [address] [phone]       Follow up with Vidal Schwalbe, MD. Schedule an appointment as soon as possible for a visit in 10 days.   Specialty:  Family Medicine   Contact information:   [place] [address] [phone]       The results of significant diagnostics from this hospitalization (including imaging, microbiology, ancillary and laboratory) are listed below for reference.    Significant Diagnostic Studies: Dg Chest 1 View  07/21/2015  CLINICAL DATA:  82 year old female  with fall and right hip pain. EXAM: CHEST 1 VIEW COMPARISON:  Chest radiograph dated 09/16/2014 FINDINGS: Single-view of the chest does not demonstrate a focal consolidation. There is no pleural effusion or pneumothorax. The cardiac silhouette is within normal limits. The aorta is tortuous. There is atherosclerotic calcification of the thoracic aorta. There is osteopenia with degenerative changes of the spine and shoulders. No acute osseous pathology. IMPRESSION: No active disease. Electronically Signed   By: Anner Crete M.D.   On: 07/21/2015 05:20   Ct Head Wo Contrast  07/21/2015  CLINICAL DATA:  82 year old female with fall EXAM: CT HEAD WITHOUT CONTRAST CT MAXILLOFACIAL WITHOUT CONTRAST CT CERVICAL SPINE WITHOUT CONTRAST TECHNIQUE: Multidetector CT imaging of the head, cervical spine, and maxillofacial structures were performed using the standard protocol without intravenous contrast. Multiplanar CT image reconstructions of the cervical spine and maxillofacial structures were also generated. COMPARISON:  Head CT dated 09/16/2014 FINDINGS: CT HEAD FINDINGS There is stable age-related atrophy and chronic  microvascular ischemic changes. No acute intracranial hemorrhage. No mass effect or midline shift. There is mild mucoperiosteal thickening of paranasal sinuses. No air-fluid level. The mastoid air cells are clear. The calvarium is intact. CT MAXILLOFACIAL FINDINGS There is no acute facial bone fractures. The maxilla, mandible, and pterygoid plates are intact. The globes, retro-orbital fat, and orbital walls are preserved. Mild mucoperiosteal thickening of paranasal sinuses. No air-fluid levels. The mastoid air cells are clear. Right mandibular and chin area skin laceration with small pockets of soft tissue gas. No large hematoma. CT CERVICAL SPINE FINDINGS There is no acute fracture or subluxation of the cervical spine.There is osteopenia with multilevel degenerative changes of the cervical spine. There is C5-C6 ankylosis.The odontoid and spinous processes are intact.There is normal anatomic alignment of the C1-C2 lateral masses. The visualized soft tissues appear unremarkable. Right thyroid hypodense nodules. Ultrasound may provide better evaluation. There bilateral carotid bulb atherosclerotic plaques. IMPRESSION: No acute intracranial hemorrhage. No acute facial bone fractures. No acute/ traumatic cervical spine pathology. Electronically Signed   By: Anner Crete M.D.   On: 07/21/2015 06:52   Ct Cervical Spine Wo Contrast  07/21/2015  CLINICAL DATA:  82 year old female with fall EXAM: CT HEAD WITHOUT CONTRAST CT MAXILLOFACIAL WITHOUT CONTRAST CT CERVICAL SPINE WITHOUT CONTRAST TECHNIQUE: Multidetector CT imaging of the head, cervical spine, and maxillofacial structures were performed using the standard protocol without intravenous contrast. Multiplanar CT image reconstructions of the cervical spine and maxillofacial structures were also generated. COMPARISON:  Head CT dated 09/16/2014 FINDINGS: CT HEAD FINDINGS There is stable age-related atrophy and chronic microvascular ischemic changes. No acute  intracranial hemorrhage. No mass effect or midline shift. There is mild mucoperiosteal thickening of paranasal sinuses. No air-fluid level. The mastoid air cells are clear. The calvarium is intact. CT MAXILLOFACIAL FINDINGS There is no acute facial bone fractures. The maxilla, mandible, and pterygoid plates are intact. The globes, retro-orbital fat, and orbital walls are preserved. Mild mucoperiosteal thickening of paranasal sinuses. No air-fluid levels. The mastoid air cells are clear. Right mandibular and chin area skin laceration with small pockets of soft tissue gas. No large hematoma. CT CERVICAL SPINE FINDINGS There is no acute fracture or subluxation of the cervical spine.There is osteopenia with multilevel degenerative changes of the cervical spine. There is C5-C6 ankylosis.The odontoid and spinous processes are intact.There is normal anatomic alignment of the C1-C2 lateral masses. The visualized soft tissues appear unremarkable. Right thyroid hypodense nodules. Ultrasound may provide better evaluation. There bilateral carotid bulb atherosclerotic plaques. IMPRESSION:  No acute intracranial hemorrhage. No acute facial bone fractures. No acute/ traumatic cervical spine pathology. Electronically Signed   By: Anner Crete M.D.   On: 07/21/2015 06:52   Ct Abdomen Pelvis W Contrast  07/21/2015  CLINICAL DATA:  Patient fell at home while power was out. Pain across entire lower abdomen. EXAM: CT ABDOMEN AND PELVIS WITH CONTRAST TECHNIQUE: Multidetector CT imaging of the abdomen and pelvis was performed using the standard protocol following bolus administration of intravenous contrast. CONTRAST:  1 ISOVUE-300 IOPAMIDOL (ISOVUE-300) INJECTION 61% COMPARISON:  09/04/2010 FINDINGS: Lower chest: Stable appearance of linear scarring in the lung bases. Hepatobiliary: 10 mm hypodensity in the lateral segment left liver has decreased in size in the interval. A second focal 7 mm low-density lesion lateral segment left  liver is also decreased. There is trace intrahepatic biliary duct dilatation. Gallbladder surgically absent. Extrahepatic common duct measures up to 8 mm in diameter which is upper normal for patient age. Pancreas: No focal mass lesion. No dilatation of the main duct. No intraparenchymal cyst. No peripancreatic edema. Spleen: No splenomegaly. No focal mass lesion. Adrenals/Urinary Tract: No adrenal nodule or mass. Right kidney unremarkable. 13 mm interpolar lesion in the left kidney (image 34 series 3) appears to enhance, but was present on the study from 5 years ago and is unchanged in the interval. 5 mm nonobstructing stone identified lower pole left kidney. No evidence for hydroureter. The urinary bladder appears normal for the degree of distention. Stomach/Bowel: Small hiatal hernia noted. Stomach otherwise unremarkable. Duodenum is normally positioned as is the ligament of Treitz. No small bowel wall thickening. No small bowel dilatation. The terminal ileum is normal. The appendix is not visualized, but there is no edema or inflammation in the region of the cecum. Diverticular changes are noted in the left colon without evidence of diverticulitis. Vascular/Lymphatic: There is abdominal aortic atherosclerosis without aneurysm. Haziness noted around the celiac axis, nonspecific but unchanged in the interval. There is no gastrohepatic or hepatoduodenal ligament lymphadenopathy. No intraperitoneal or retroperitoneal lymphadenopathy. No pelvic sidewall lymphadenopathy. Reproductive: Uterus is surgically absent. There is no adnexal mass. Other: No intraperitoneal free fluid. Musculoskeletal: Bone windows reveal no worrisome lytic or sclerotic osseous lesions. Inferior endplate compression deformity at L1 is age indeterminate but new since the prior study. Diffuse degenerative disc disease is noted in the lumbar spine. IMPRESSION: 1. No acute findings in the abdomen or pelvis. No intraperitoneal free fluid. 2. 13 mm  interpolar lesion left kidney may be a solid lesion and neoplasm would be a consideration. This has remained completely stable in size since 09/04/2010 suggesting a benign etiology. MRI without and with contrast may be able to further characterize, as clinically warranted. 3. Abdominal aortic atherosclerosis. 4. Mild intra and extrahepatic biliary duct distention, likely within normal limits for patient age, especially status post cholecystectomy. Electronically Signed   By: Misty Stanley M.D.   On: 07/21/2015 08:52   Dg Hip Unilat With Pelvis 2-3 Views Right  07/21/2015  CLINICAL DATA:  82 year old female with fall and right hip pain. EXAM: DG HIP (WITH OR WITHOUT PELVIS) 2-3V RIGHT COMPARISON:  Right hip radiographs dated 06/20/2014 FINDINGS: There is no definite acute fracture or dislocation of the right hip. Evaluation however is limited due to osteopenia. No pelvic bone fractures identified. There is degenerative changes of the visualized lower lumbar spine. The soft tissues are grossly unremarkable. IMPRESSION: No acute fracture or dislocation.  Advanced osteopenia. Electronically Signed   By: Laren Everts.D.  On: 07/21/2015 05:22   Ct Maxillofacial Wo Cm  07/21/2015  CLINICAL DATA:  82 year old female with fall EXAM: CT HEAD WITHOUT CONTRAST CT MAXILLOFACIAL WITHOUT CONTRAST CT CERVICAL SPINE WITHOUT CONTRAST TECHNIQUE: Multidetector CT imaging of the head, cervical spine, and maxillofacial structures were performed using the standard protocol without intravenous contrast. Multiplanar CT image reconstructions of the cervical spine and maxillofacial structures were also generated. COMPARISON:  Head CT dated 09/16/2014 FINDINGS: CT HEAD FINDINGS There is stable age-related atrophy and chronic microvascular ischemic changes. No acute intracranial hemorrhage. No mass effect or midline shift. There is mild mucoperiosteal thickening of paranasal sinuses. No air-fluid level. The mastoid air cells are  clear. The calvarium is intact. CT MAXILLOFACIAL FINDINGS There is no acute facial bone fractures. The maxilla, mandible, and pterygoid plates are intact. The globes, retro-orbital fat, and orbital walls are preserved. Mild mucoperiosteal thickening of paranasal sinuses. No air-fluid levels. The mastoid air cells are clear. Right mandibular and chin area skin laceration with small pockets of soft tissue gas. No large hematoma. CT CERVICAL SPINE FINDINGS There is no acute fracture or subluxation of the cervical spine.There is osteopenia with multilevel degenerative changes of the cervical spine. There is C5-C6 ankylosis.The odontoid and spinous processes are intact.There is normal anatomic alignment of the C1-C2 lateral masses. The visualized soft tissues appear unremarkable. Right thyroid hypodense nodules. Ultrasound may provide better evaluation. There bilateral carotid bulb atherosclerotic plaques. IMPRESSION: No acute intracranial hemorrhage. No acute facial bone fractures. No acute/ traumatic cervical spine pathology. Electronically Signed   By: Anner Crete M.D.   On: 07/21/2015 06:52   Labs: Basic Metabolic Panel:  Recent Labs Lab 07/21/15 0421 07/22/15 0607  NA 140 140  K 3.8 3.3*  CL 106 106  CO2 27 28  GLUCOSE 94 96  BUN 17 13  CREATININE 0.62 0.57  CALCIUM 9.7 8.9   CBC:  Recent Labs Lab 07/21/15 0421 07/22/15 0607  WBC 9.5 5.6  NEUTROABS 7.2  --   HGB 13.5 12.1  HCT 42.7 38.8  MCV 95.5 96.3  PLT 217 191   Cardiac Enzymes:  Recent Labs Lab 07/21/15 0421 07/22/15 0607  CKTOTAL 93 113   BNP: BNP (last 3 results)  Recent Labs  09/16/14 2344  BNP 79.2    Signed:  Barton Dubois MD.  [facility] 07/22/2015, 4:33 PM

## 2015-07-22 NOTE — Progress Notes (Signed)
PT Cancellation Note  Patient Details Name: Tracy Sharp MRN: [id number] DOB: 21-Apr-1924   Cancelled Treatment:    Reason Eval/Treat Not Completed: Patient not medically ready Pt on bedrest. Please increase activity orders when appropriate to allow for PT evaluation. Will follow.   Marguarite Arbour A Jodee Wagenaar 07/22/2015, 8:45 AM Wray Kearns, [place], DPT [phone]

## 2015-07-22 NOTE — Evaluation (Signed)
Occupational Therapy Evaluation Patient Details Name: Tracy Sharp MRN: [id number] DOB: 1924/10/20 Today's Date: 07/22/2015    History of Present Illness Patient is a 82 y/o female with hx of nonobstructive CAD, HLD, HTN, IBS, depression/anxiety and RA presents s/p fall at home. Found to have compression fracture at L1, UTI.   Clinical Impression   Pt reports she required assist from her daughter in law with ADLs (cooking, getting in shower, LB bathing/dressing) PTA. Currently pt requires mod assist for basic transfers and min-mod assist for ADLs. Pt reports history of falls PTA; pt continues to be a high fall risk at this time. Recommending SNF for follow up in order to maximize independence and safety with ADLs and functional mobility prior to return home. Pt would benefit from continued skilled OT to address established goals.    Follow Up Recommendations  SNF;Supervision/Assistance - 24 hour    Equipment Recommendations  None recommended by OT    Recommendations for Other Services       Precautions / Restrictions Precautions Precautions: Fall;Back Precaution Booklet Issued: No Restrictions Weight Bearing Restrictions: No      Mobility Bed Mobility               General bed mobility comments: Pt OOB in chair upon arrival.  Transfers Overall transfer level: Needs assistance Equipment used: Rolling walker (2 wheeled) Transfers: Sit to/from [employer] Transfers Sit to Stand: Mod assist Stand pivot transfers: Min assist       General transfer comment: Mod assist to boost up from chair and BSC. Pt with good hand placement but increased time required. Min assist for balance during stand pivot transfer.    Balance Overall balance assessment: Needs assistance;History of Falls Sitting-balance support: Feet supported;No upper extremity supported Sitting balance-Leahy Scale: Fair     Standing balance support: No upper extremity supported;During functional  activity Standing balance-Leahy Scale: Poor Standing balance comment: Min assist for balance in standing during peri care and clothing management following toileting.                            ADL Overall ADL's : Needs assistance/impaired Eating/Feeding: Set up;Sitting   Grooming: Set up;Supervision/safety;Sitting   Upper Body Bathing: Minimal assitance;Sitting   Lower Body Bathing: Moderate assistance;Sit to/from stand   Upper Body Dressing : Minimal assistance;Sitting   Lower Body Dressing: Moderate assistance;Sit to/from stand   Toilet Transfer: Moderate assistance;Stand-pivot;BSC;RW Toilet Transfer Details (indicate cue type and reason): Mod assist for sit to stand; min assist for stand pivot Toileting- Clothing Manipulation and Hygiene: Minimal assistance;Sit to/from stand Toileting - Clothing Manipulation Details (indicate cue type and reason): Min assist for balance in standing. Pt able to perform peri care and manage clothing.     Functional mobility during ADLs: Moderate assistance;Rolling walker (for stand pivot) General ADL Comments: Pt reports multiple falls at home recently. States she feels weak all over and is fearful of more falls.     Vision Vision Assessment?: No apparent visual deficits Additional Comments: disconjugate gaze. Overall [facility]   Perception     Praxis      Pertinent Vitals/Pain Pain Assessment: Faces Pain Score: 6  Faces Pain Scale: Hurts even more Pain Location: R side and abdomen Pain Descriptors / Indicators: Grimacing;Guarding;Sore Pain Intervention(s): Monitored during session     Hand Dominance Right   Extremity/Trunk Assessment Upper Extremity Assessment Upper Extremity Assessment: Generalized weakness (reports decresed sensation distally on bil UEs)  Lower Extremity Assessment Lower Extremity Assessment: Defer to PT evaluation   Cervical / Trunk Assessment Cervical / Trunk Assessment: Kyphotic   Communication  Communication Communication: HOH (has hearing aids)   Cognition Arousal/Alertness: Awake/alert Behavior During Therapy: WFL for tasks assessed/performed Overall Cognitive Status: Within Functional Limits for tasks assessed                     General Comments       Exercises       Shoulder Instructions      Home Living Family/patient expects to be discharged to:: Private residence Living Arrangements: Alone Available Help at Discharge: Family;Available PRN/intermittently Type of Home: House Home Access: Ramped entrance     Home Layout: One level     Bathroom Shower/Tub: Occupational psychologist: Handicapped height     Home Equipment: Bedside commode;Walker - 2 wheels;Grab bars - toilet;Shower seat (lift chair)          Prior Functioning/Environment Level of Independence: Needs assistance  Gait / Transfers Assistance Needed: Reports 6 falls in 6 weeks. Uses rollator for ambulation. ADL's / Homemaking Assistance Needed: states daughter helps her into shower and she sits and bathes but needs assist for lower legs and feet; assist with dressing, meals etc.         OT Diagnosis: Generalized weakness;Acute pain   OT Problem List: Decreased strength;Decreased activity tolerance;Impaired balance (sitting and/or standing);Decreased coordination;Decreased knowledge of use of DME or AE;Impaired sensation;Pain   OT Treatment/Interventions: Self-care/ADL training;Therapeutic exercise;Energy conservation;DME and/or AE instruction;Therapeutic activities;Patient/family education;Balance training    OT Goals(Current goals can be found in the care plan section) Acute Rehab OT Goals Patient Stated Goal: to get stronger OT Goal Formulation: With patient Time For Goal Achievement: 08/05/15 Potential to Achieve Goals: Good ADL Goals Pt Will Perform Grooming: with min guard assist;standing Pt Will Perform Upper Body Bathing: with supervision;sitting Pt Will Perform  Lower Body Bathing: with min assist;sit to/from stand Pt Will Transfer to Toilet: with min guard assist;ambulating;bedside commode Pt Will Perform Toileting - Clothing Manipulation and hygiene: with min guard assist;sit to/from stand Pt Will Perform Tub/Shower Transfer: with min guard assist;ambulating;shower seat;Shower transfer;rolling walker Pt/caregiver will Perform Home Exercise Program: Increased strength;Both right and left upper extremity;With theraband;Independently;With written HEP provided  OT Frequency: Min 2X/week   Barriers to D/C: Decreased caregiver support  pt typically lives alone with daugher in law coming to assist during the day       Co-evaluation              End of Session Equipment Utilized During Treatment: Gait belt;Rolling walker Nurse Communication: Mobility status;Patient requests pain meds  Activity Tolerance: Patient tolerated treatment well Patient left: in chair;with call bell/phone within reach;with chair alarm set   Time: 1191-4782 OT Time Calculation (min): 23 min Charges:  OT General Charges $OT Visit: 1 Procedure OT Evaluation $OT Eval Moderate Complexity: 1 Procedure OT Treatments $Self Care/Home Management : 8-22 mins G-Codes: OT G-codes **NOT FOR INPATIENT CLASS** Functional Assessment Tool Used: Clinical judgement Functional Limitation: Self care Self Care Current Status (N5621): At least 20 percent but less than 40 percent impaired, limited or restricted Self Care Goal Status (H0865): At least 1 percent but less than 20 percent impaired, limited or restricted   Binnie Kand M.S., OTR/L Pager: [phone]  07/22/2015, 4:10 PM

## 2015-07-22 NOTE — Progress Notes (Signed)
Nsg Discharge Note  Admit Date:  07/21/2015 Discharge date: 07/22/2015   Lanetta Inch to be D/C'd Home per MD order.  AVS completed.  Copy for chart, and copy for patient signed, and dated. Patient/caregiver able to verbalize understanding.  Discharge Medication:   Medication List    TAKE these medications        acetaminophen 500 MG tablet  Commonly known as:  TYLENOL  Take 2 tablets (1,000 mg total) by mouth every 8 (eight) hours.     aspirin 81 MG tablet  Take 81 mg by mouth daily.     buPROPion 150 MG 12 hr tablet  Commonly known as:  WELLBUTRIN SR  Take 450 mg by mouth daily.     calcitonin (salmon) 200 UNIT/ACT nasal spray  Commonly known as:  MIACALCIN/FORTICAL  Place 1 spray into alternate nostrils daily.     cefUROXime 500 MG tablet  Commonly known as:  CEFTIN  Take 1 tablet (500 mg total) by mouth 2 (two) times daily with a meal.     cholecalciferol 1000 units tablet  Commonly known as:  VITAMIN D  Take 1,000 Units by mouth daily.     diclofenac sodium 1 % Gel  Commonly known as:  VOLTAREN  Apply 4 g topically 4 (four) times daily as needed (pain).     DULoxetine 60 MG capsule  Commonly known as:  CYMBALTA  Take 60 mg by mouth daily.     feeding supplement (ENSURE ENLIVE) Liqd  Take 237 mLs by mouth 2 (two) times daily between meals.     folic acid 1 MG tablet  Commonly known as:  FOLVITE  Take 1 mg by mouth daily.     gabapentin 100 MG capsule  Commonly known as:  NEURONTIN  Take 1 capsule (100 mg total) by mouth 3 (three) times daily.     LUTEIN-ZEAXANTHIN PO  Take 1 tablet by mouth daily.     Melatonin 5 MG Tabs  Take 7.5 mg by mouth at bedtime.     methotrexate 2.5 MG tablet  Commonly known as:  RHEUMATREX  Take 10 mg by mouth once a week. 10 mg ( 4 tablets )     MYRBETRIQ 50 MG Tb24 tablet  Generic drug:  mirabegron ER  Take 50 mg by mouth at bedtime.     oxyCODONE 5 MG immediate release tablet  Commonly known as:  Oxy IR/ROXICODONE   Take 2.5-5 mg by mouth every 6 (six) hours as needed for severe pain.        Discharge Assessment: Filed Vitals:   07/22/15 0557 07/22/15 1449  BP: 119/55 110/60  Pulse: 76 78  Temp: 98 F (36.7 C) 97.4 F (36.3 C)  Resp: 18 19   Skin clean, dry and intact without evidence of skin break down, no evidence of skin tears noted. Chin with abrasions and scattered bruises.  IV catheter discontinued intact. Site without signs and symptoms of complications - no redness or edema noted at insertion site, patient denies c/o pain - only slight tenderness at site.  Dressing with slight pressure applied.  D/c Instructions-Education: Discharge instructions given to patient/family with verbalized understanding. D/c education completed with patient/family including follow up instructions, medication list, d/c activities limitations if indicated, with other d/c instructions as indicated by MD - patient able to verbalize understanding, all questions fully answered. Patient instructed to return to ED, call 911, or call MD for any changes in condition.  Patient escorted via [place], and  D/C home via private auto.  Essa Wenk Margaretha Sheffield, RN 07/22/2015 5:58 PM

## 2015-07-22 NOTE — Progress Notes (Signed)
OT Cancellation Note  Patient Details Name: Tracy Sharp MRN: [id number] DOB: [birth year]   Cancelled Treatment:    Reason Eval/Treat Not Completed: Patient not medically ready (continued active bedrest orders).  Binnie Kand M.S., OTR/L Pager: [phone]  07/22/2015, 9:14 AM

## 2015-07-25 DIAGNOSIS — K589 Irritable bowel syndrome without diarrhea: Secondary | ICD-10-CM | POA: Diagnosis not present

## 2015-07-25 DIAGNOSIS — M1990 Unspecified osteoarthritis, unspecified site: Secondary | ICD-10-CM | POA: Diagnosis not present

## 2015-07-25 DIAGNOSIS — F329 Major depressive disorder, single episode, unspecified: Secondary | ICD-10-CM | POA: Diagnosis not present

## 2015-07-25 DIAGNOSIS — N3946 Mixed incontinence: Secondary | ICD-10-CM | POA: Diagnosis not present

## 2015-07-25 DIAGNOSIS — W19XXXD Unspecified fall, subsequent encounter: Secondary | ICD-10-CM | POA: Diagnosis not present

## 2015-07-25 DIAGNOSIS — S32010D Wedge compression fracture of first lumbar vertebra, subsequent encounter for fracture with routine healing: Secondary | ICD-10-CM | POA: Diagnosis not present

## 2015-07-25 DIAGNOSIS — M069 Rheumatoid arthritis, unspecified: Secondary | ICD-10-CM | POA: Diagnosis not present

## 2015-07-25 DIAGNOSIS — I10 Essential (primary) hypertension: Secondary | ICD-10-CM | POA: Diagnosis not present

## 2015-07-25 DIAGNOSIS — M810 Age-related osteoporosis without current pathological fracture: Secondary | ICD-10-CM | POA: Diagnosis not present

## 2015-07-25 DIAGNOSIS — Z7982 Long term (current) use of aspirin: Secondary | ICD-10-CM | POA: Diagnosis not present

## 2015-07-25 DIAGNOSIS — N390 Urinary tract infection, site not specified: Secondary | ICD-10-CM | POA: Diagnosis not present

## 2015-07-25 DIAGNOSIS — G609 Hereditary and idiopathic neuropathy, unspecified: Secondary | ICD-10-CM | POA: Diagnosis not present

## 2015-07-25 DIAGNOSIS — I2510 Atherosclerotic heart disease of native coronary artery without angina pectoris: Secondary | ICD-10-CM | POA: Diagnosis not present

## 2015-07-25 DIAGNOSIS — E440 Moderate protein-calorie malnutrition: Secondary | ICD-10-CM | POA: Diagnosis not present

## 2015-07-27 DIAGNOSIS — G609 Hereditary and idiopathic neuropathy, unspecified: Secondary | ICD-10-CM | POA: Diagnosis not present

## 2015-07-27 DIAGNOSIS — N390 Urinary tract infection, site not specified: Secondary | ICD-10-CM | POA: Diagnosis not present

## 2015-07-27 DIAGNOSIS — S32010D Wedge compression fracture of first lumbar vertebra, subsequent encounter for fracture with routine healing: Secondary | ICD-10-CM | POA: Diagnosis not present

## 2015-07-27 DIAGNOSIS — I2510 Atherosclerotic heart disease of native coronary artery without angina pectoris: Secondary | ICD-10-CM | POA: Diagnosis not present

## 2015-07-27 DIAGNOSIS — M069 Rheumatoid arthritis, unspecified: Secondary | ICD-10-CM | POA: Diagnosis not present

## 2015-07-27 DIAGNOSIS — I10 Essential (primary) hypertension: Secondary | ICD-10-CM | POA: Diagnosis not present

## 2015-07-28 DIAGNOSIS — G609 Hereditary and idiopathic neuropathy, unspecified: Secondary | ICD-10-CM | POA: Diagnosis not present

## 2015-07-28 DIAGNOSIS — N390 Urinary tract infection, site not specified: Secondary | ICD-10-CM | POA: Diagnosis not present

## 2015-07-28 DIAGNOSIS — M069 Rheumatoid arthritis, unspecified: Secondary | ICD-10-CM | POA: Diagnosis not present

## 2015-07-28 DIAGNOSIS — I2510 Atherosclerotic heart disease of native coronary artery without angina pectoris: Secondary | ICD-10-CM | POA: Diagnosis not present

## 2015-07-28 DIAGNOSIS — I10 Essential (primary) hypertension: Secondary | ICD-10-CM | POA: Diagnosis not present

## 2015-07-28 DIAGNOSIS — S32010D Wedge compression fracture of first lumbar vertebra, subsequent encounter for fracture with routine healing: Secondary | ICD-10-CM | POA: Diagnosis not present

## 2015-07-29 DIAGNOSIS — M069 Rheumatoid arthritis, unspecified: Secondary | ICD-10-CM | POA: Diagnosis not present

## 2015-07-29 DIAGNOSIS — N390 Urinary tract infection, site not specified: Secondary | ICD-10-CM | POA: Diagnosis not present

## 2015-07-29 DIAGNOSIS — I2510 Atherosclerotic heart disease of native coronary artery without angina pectoris: Secondary | ICD-10-CM | POA: Diagnosis not present

## 2015-07-29 DIAGNOSIS — S32010D Wedge compression fracture of first lumbar vertebra, subsequent encounter for fracture with routine healing: Secondary | ICD-10-CM | POA: Diagnosis not present

## 2015-07-29 DIAGNOSIS — G609 Hereditary and idiopathic neuropathy, unspecified: Secondary | ICD-10-CM | POA: Diagnosis not present

## 2015-07-29 DIAGNOSIS — I10 Essential (primary) hypertension: Secondary | ICD-10-CM | POA: Diagnosis not present

## 2015-07-30 DIAGNOSIS — N390 Urinary tract infection, site not specified: Secondary | ICD-10-CM | POA: Diagnosis not present

## 2015-07-30 DIAGNOSIS — S32010D Wedge compression fracture of first lumbar vertebra, subsequent encounter for fracture with routine healing: Secondary | ICD-10-CM | POA: Diagnosis not present

## 2015-07-30 DIAGNOSIS — I2510 Atherosclerotic heart disease of native coronary artery without angina pectoris: Secondary | ICD-10-CM | POA: Diagnosis not present

## 2015-07-30 DIAGNOSIS — I10 Essential (primary) hypertension: Secondary | ICD-10-CM | POA: Diagnosis not present

## 2015-07-30 DIAGNOSIS — G609 Hereditary and idiopathic neuropathy, unspecified: Secondary | ICD-10-CM | POA: Diagnosis not present

## 2015-07-30 DIAGNOSIS — M069 Rheumatoid arthritis, unspecified: Secondary | ICD-10-CM | POA: Diagnosis not present

## 2015-07-31 DIAGNOSIS — E876 Hypokalemia: Secondary | ICD-10-CM | POA: Diagnosis not present

## 2015-07-31 DIAGNOSIS — M069 Rheumatoid arthritis, unspecified: Secondary | ICD-10-CM | POA: Diagnosis not present

## 2015-07-31 DIAGNOSIS — E46 Unspecified protein-calorie malnutrition: Secondary | ICD-10-CM | POA: Diagnosis not present

## 2015-07-31 DIAGNOSIS — S32000D Wedge compression fracture of unspecified lumbar vertebra, subsequent encounter for fracture with routine healing: Secondary | ICD-10-CM | POA: Diagnosis not present

## 2015-07-31 DIAGNOSIS — I2510 Atherosclerotic heart disease of native coronary artery without angina pectoris: Secondary | ICD-10-CM | POA: Diagnosis not present

## 2015-07-31 DIAGNOSIS — M8000XD Age-related osteoporosis with current pathological fracture, unspecified site, subsequent encounter for fracture with routine healing: Secondary | ICD-10-CM | POA: Diagnosis not present

## 2015-07-31 DIAGNOSIS — G609 Hereditary and idiopathic neuropathy, unspecified: Secondary | ICD-10-CM | POA: Diagnosis not present

## 2015-07-31 DIAGNOSIS — N3281 Overactive bladder: Secondary | ICD-10-CM | POA: Diagnosis not present

## 2015-07-31 DIAGNOSIS — N390 Urinary tract infection, site not specified: Secondary | ICD-10-CM | POA: Diagnosis not present

## 2015-07-31 DIAGNOSIS — R5381 Other malaise: Secondary | ICD-10-CM | POA: Diagnosis not present

## 2015-07-31 DIAGNOSIS — N3000 Acute cystitis without hematuria: Secondary | ICD-10-CM | POA: Diagnosis not present

## 2015-07-31 DIAGNOSIS — R197 Diarrhea, unspecified: Secondary | ICD-10-CM | POA: Diagnosis not present

## 2015-07-31 DIAGNOSIS — I10 Essential (primary) hypertension: Secondary | ICD-10-CM | POA: Diagnosis not present

## 2015-07-31 DIAGNOSIS — S32010D Wedge compression fracture of first lumbar vertebra, subsequent encounter for fracture with routine healing: Secondary | ICD-10-CM | POA: Diagnosis not present

## 2015-08-01 ENCOUNTER — Encounter (HOSPITAL_COMMUNITY): Payer: Self-pay

## 2015-08-01 ENCOUNTER — Emergency Department (HOSPITAL_COMMUNITY): Payer: Medicare Other

## 2015-08-01 ENCOUNTER — Emergency Department (HOSPITAL_COMMUNITY)
Admission: EM | Admit: 2015-08-01 | Discharge: 2015-08-01 | Disposition: A | Discharge status: Home / Self Care | Payer: Medicare Other | Attending: Emergency Medicine | Admitting: Emergency Medicine

## 2015-08-01 DIAGNOSIS — Y92093 Driveway of other non-institutional residence as the place of occurrence of the external cause: Secondary | ICD-10-CM | POA: Diagnosis not present

## 2015-08-01 DIAGNOSIS — Z7982 Long term (current) use of aspirin: Secondary | ICD-10-CM | POA: Diagnosis not present

## 2015-08-01 DIAGNOSIS — Y9339 Activity, other involving climbing, rappelling and jumping off: Secondary | ICD-10-CM | POA: Diagnosis not present

## 2015-08-01 DIAGNOSIS — S0101XA Laceration without foreign body of scalp, initial encounter: Secondary | ICD-10-CM | POA: Diagnosis not present

## 2015-08-01 DIAGNOSIS — Z79899 Other long term (current) drug therapy: Secondary | ICD-10-CM | POA: Insufficient documentation

## 2015-08-01 DIAGNOSIS — S098XXA Other specified injuries of head, initial encounter: Secondary | ICD-10-CM | POA: Diagnosis not present

## 2015-08-01 DIAGNOSIS — E785 Hyperlipidemia, unspecified: Secondary | ICD-10-CM | POA: Insufficient documentation

## 2015-08-01 DIAGNOSIS — W19XXXA Unspecified fall, initial encounter: Secondary | ICD-10-CM

## 2015-08-01 DIAGNOSIS — S0990XA Unspecified injury of head, initial encounter: Secondary | ICD-10-CM | POA: Diagnosis not present

## 2015-08-01 DIAGNOSIS — I2510 Atherosclerotic heart disease of native coronary artery without angina pectoris: Secondary | ICD-10-CM | POA: Diagnosis not present

## 2015-08-01 DIAGNOSIS — S0003XA Contusion of scalp, initial encounter: Secondary | ICD-10-CM | POA: Diagnosis not present

## 2015-08-01 DIAGNOSIS — S0190XA Unspecified open wound of unspecified part of head, initial encounter: Secondary | ICD-10-CM | POA: Diagnosis not present

## 2015-08-01 DIAGNOSIS — Z96652 Presence of left artificial knee joint: Secondary | ICD-10-CM | POA: Insufficient documentation

## 2015-08-01 DIAGNOSIS — I10 Essential (primary) hypertension: Secondary | ICD-10-CM | POA: Diagnosis not present

## 2015-08-01 DIAGNOSIS — Y999 Unspecified external cause status: Secondary | ICD-10-CM | POA: Diagnosis not present

## 2015-08-01 DIAGNOSIS — W109XXA Fall (on) (from) unspecified stairs and steps, initial encounter: Secondary | ICD-10-CM | POA: Insufficient documentation

## 2015-08-01 MED ORDER — ACETAMINOPHEN 325 MG PO TABS
650.0000 mg | ORAL_TABLET | Freq: Once | ORAL | Status: AC
  Administered 2015-08-01: 650 mg via ORAL
  Filled 2015-08-01: qty 2

## 2015-08-01 NOTE — ED Notes (Signed)
Pt. Was walking down her driveway and fell backwards and hit the back of her head.  She denies any dizziness or chest pain or sob.  Skin is warm, pink and dry.  Pt. Has a hematoma on the back of her head with bleeding controlled.  She also arrived with a c-collar.  Pt. Is alert and oriented X4 and remembers the fall.

## 2015-08-01 NOTE — ED Notes (Signed)
Pt. Has bruises noted to her neck and arms .  They are at different colors.

## 2015-08-01 NOTE — ED Notes (Signed)
No pain

## 2015-08-01 NOTE — ED Notes (Signed)
Room temp adjusted
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
## 2015-08-01 NOTE — Discharge Instructions (Signed)
Facial or Scalp Contusion °A facial or scalp contusion is a deep bruise on the face or head. Injuries to the face and head generally cause a lot of swelling, especially around the eyes. Contusions are the result of an injury that caused bleeding under the skin. The contusion may turn blue, purple, or yellow. Minor injuries will give you a painless contusion, but more severe contusions may stay painful and swollen for a few weeks.  °CAUSES  °A facial or scalp contusion is caused by a blunt injury or trauma to the face or head area.  °SIGNS AND SYMPTOMS  °· Swelling of the injured area.   °· Discoloration of the injured area.   °· Tenderness, soreness, or pain in the injured area.   °DIAGNOSIS  °The diagnosis can be made by taking a medical history and doing a physical exam. An X-ray exam, CT scan, or MRI may be needed to determine if there are any associated injuries, such as broken bones (fractures). °TREATMENT  °Often, the best treatment for a facial or scalp contusion is applying cold compresses to the injured area. Over-the-counter medicines may also be recommended for pain control.  °HOME CARE INSTRUCTIONS  °· Only take over-the-counter or prescription medicines as directed by your health care provider.   °· Apply ice to the injured area.   °¨ Put ice in a plastic bag.   °¨ Place a towel between your skin and the bag.   °¨ Leave the ice on for 20 minutes, 2-3 times a day.   °SEEK MEDICAL CARE IF: °· You have bite problems.   °· You have pain with chewing.   °· You are concerned about facial defects. °SEEK IMMEDIATE MEDICAL CARE IF: °· You have severe pain or a headache that is not relieved by medicine.   °· You have unusual sleepiness, confusion, or personality changes.   °· You throw up (vomit).   °· You have a persistent nosebleed.   °· You have double vision or blurred vision.   °· You have fluid drainage from your nose or ear.   °· You have difficulty walking or using your arms or legs.   °MAKE SURE YOU:   °· Understand these instructions. °· Will watch your condition. °· Will get help right away if you are not doing well or get worse. °  °This information is not intended to replace advice given to you by your health care provider. Make sure you discuss any questions you have with your health care provider. °  °Document Released: 02/25/2004 Document Revised: 02/07/2014 Document Reviewed: 08/30/2012 °Elsevier Interactive Patient Education ©2016 Elsevier Inc. ° °Hematoma °A hematoma is a collection of blood under the skin, in an organ, in a body space, in a joint space, or in other tissue. The blood can clot to form a lump that you can see and feel. The lump is often firm and may sometimes become sore and tender. Most hematomas get better in a few days to weeks. However, some hematomas may be serious and require medical care. Hematomas can range in size from very small to very large. °CAUSES  °A hematoma can be caused by a blunt or penetrating injury. It can also be caused by spontaneous leakage from a blood vessel under the skin. Spontaneous leakage from a blood vessel is more likely to occur in older people, especially those taking blood thinners. Sometimes, a hematoma can develop after certain medical procedures. °SIGNS AND SYMPTOMS  °· A firm lump on the body. °· Possible pain and tenderness in the area. °· Bruising. Blue, dark blue, purple-red, or yellowish skin   may appear at the site of the hematoma if the hematoma is close to the surface of the skin. °For hematomas in deeper tissues or body spaces, the signs and symptoms may be subtle. For example, an intra-abdominal hematoma may cause abdominal pain, weakness, fainting, and shortness of breath. An intracranial hematoma may cause a headache or symptoms such as weakness, trouble speaking, or a change in consciousness. °DIAGNOSIS  °A hematoma can usually be diagnosed based on your medical history and a physical exam. Imaging tests may be needed if your health care  provider suspects a hematoma in deeper tissues or body spaces, such as the abdomen, head, or chest. These tests may include ultrasonography or a CT scan.  °TREATMENT  °Hematomas usually go away on their own over time. Rarely does the blood need to be drained out of the body. Large hematomas or those that may affect vital organs will sometimes need surgical drainage or monitoring. °HOME CARE INSTRUCTIONS  °· Apply ice to the injured area:   °¨ Put ice in a plastic bag.   °¨ Place a towel between your skin and the bag.   °¨ Leave the ice on for 20 minutes, 2-3 times a day for the first 1 to 2 days.   °· After the first 2 days, switch to using warm compresses on the hematoma.   °· Elevate the injured area to help decrease pain and swelling. Wrapping the area with an elastic bandage may also be helpful. Compression helps to reduce swelling and promotes shrinking of the hematoma. Make sure the bandage is not wrapped too tight.   °· If your hematoma is on a lower extremity and is painful, crutches may be helpful for a couple days.   °· Only take over-the-counter or prescription medicines as directed by your health care provider. °SEEK IMMEDIATE MEDICAL CARE IF:  °· You have increasing pain, or your pain is not controlled with medicine.   °· You have a fever.   °· You have worsening swelling or discoloration.   °· Your skin over the hematoma breaks or starts bleeding.   °· Your hematoma is in your chest or abdomen and you have weakness, shortness of breath, or a change in consciousness. °· Your hematoma is on your scalp (caused by a fall or injury) and you have a worsening headache or a change in alertness or consciousness. °MAKE SURE YOU:  °· Understand these instructions. °· Will watch your condition. °· Will get help right away if you are not doing well or get worse. °  °This information is not intended to replace advice given to you by your health care provider. Make sure you discuss any questions you have with your  health care provider. °  °Document Released: 09/01/2003 Document Revised: 09/19/2012 Document Reviewed: 06/27/2012 °Elsevier Interactive Patient Education ©2016 Elsevier Inc. ° °

## 2015-08-01 NOTE — ED Notes (Signed)
Cleaned wound.

## 2015-08-03 DIAGNOSIS — M069 Rheumatoid arthritis, unspecified: Secondary | ICD-10-CM | POA: Diagnosis not present

## 2015-08-03 DIAGNOSIS — I10 Essential (primary) hypertension: Secondary | ICD-10-CM | POA: Diagnosis not present

## 2015-08-03 DIAGNOSIS — S32010D Wedge compression fracture of first lumbar vertebra, subsequent encounter for fracture with routine healing: Secondary | ICD-10-CM | POA: Diagnosis not present

## 2015-08-03 DIAGNOSIS — G609 Hereditary and idiopathic neuropathy, unspecified: Secondary | ICD-10-CM | POA: Diagnosis not present

## 2015-08-03 DIAGNOSIS — I2510 Atherosclerotic heart disease of native coronary artery without angina pectoris: Secondary | ICD-10-CM | POA: Diagnosis not present

## 2015-08-03 DIAGNOSIS — N390 Urinary tract infection, site not specified: Secondary | ICD-10-CM | POA: Diagnosis not present

## 2015-08-04 DIAGNOSIS — I10 Essential (primary) hypertension: Secondary | ICD-10-CM | POA: Diagnosis not present

## 2015-08-04 DIAGNOSIS — N390 Urinary tract infection, site not specified: Secondary | ICD-10-CM | POA: Diagnosis not present

## 2015-08-04 DIAGNOSIS — S32010D Wedge compression fracture of first lumbar vertebra, subsequent encounter for fracture with routine healing: Secondary | ICD-10-CM | POA: Diagnosis not present

## 2015-08-04 DIAGNOSIS — M069 Rheumatoid arthritis, unspecified: Secondary | ICD-10-CM | POA: Diagnosis not present

## 2015-08-04 DIAGNOSIS — G609 Hereditary and idiopathic neuropathy, unspecified: Secondary | ICD-10-CM | POA: Diagnosis not present

## 2015-08-04 DIAGNOSIS — I2510 Atherosclerotic heart disease of native coronary artery without angina pectoris: Secondary | ICD-10-CM | POA: Diagnosis not present

## 2015-08-05 DIAGNOSIS — S32010D Wedge compression fracture of first lumbar vertebra, subsequent encounter for fracture with routine healing: Secondary | ICD-10-CM | POA: Diagnosis not present

## 2015-08-05 DIAGNOSIS — I2510 Atherosclerotic heart disease of native coronary artery without angina pectoris: Secondary | ICD-10-CM | POA: Diagnosis not present

## 2015-08-05 DIAGNOSIS — G609 Hereditary and idiopathic neuropathy, unspecified: Secondary | ICD-10-CM | POA: Diagnosis not present

## 2015-08-05 DIAGNOSIS — I10 Essential (primary) hypertension: Secondary | ICD-10-CM | POA: Diagnosis not present

## 2015-08-05 DIAGNOSIS — M069 Rheumatoid arthritis, unspecified: Secondary | ICD-10-CM | POA: Diagnosis not present

## 2015-08-05 DIAGNOSIS — N390 Urinary tract infection, site not specified: Secondary | ICD-10-CM | POA: Diagnosis not present

## 2015-08-06 DIAGNOSIS — I2510 Atherosclerotic heart disease of native coronary artery without angina pectoris: Secondary | ICD-10-CM | POA: Diagnosis not present

## 2015-08-06 DIAGNOSIS — I10 Essential (primary) hypertension: Secondary | ICD-10-CM | POA: Diagnosis not present

## 2015-08-06 DIAGNOSIS — G609 Hereditary and idiopathic neuropathy, unspecified: Secondary | ICD-10-CM | POA: Diagnosis not present

## 2015-08-06 DIAGNOSIS — N390 Urinary tract infection, site not specified: Secondary | ICD-10-CM | POA: Diagnosis not present

## 2015-08-06 DIAGNOSIS — S32010D Wedge compression fracture of first lumbar vertebra, subsequent encounter for fracture with routine healing: Secondary | ICD-10-CM | POA: Diagnosis not present

## 2015-08-06 DIAGNOSIS — M069 Rheumatoid arthritis, unspecified: Secondary | ICD-10-CM | POA: Diagnosis not present

## 2015-08-07 DIAGNOSIS — N390 Urinary tract infection, site not specified: Secondary | ICD-10-CM | POA: Diagnosis not present

## 2015-08-07 DIAGNOSIS — S32010D Wedge compression fracture of first lumbar vertebra, subsequent encounter for fracture with routine healing: Secondary | ICD-10-CM | POA: Diagnosis not present

## 2015-08-07 DIAGNOSIS — Z111 Encounter for screening for respiratory tuberculosis: Secondary | ICD-10-CM | POA: Diagnosis not present

## 2015-08-07 DIAGNOSIS — I2510 Atherosclerotic heart disease of native coronary artery without angina pectoris: Secondary | ICD-10-CM | POA: Diagnosis not present

## 2015-08-07 DIAGNOSIS — N3000 Acute cystitis without hematuria: Secondary | ICD-10-CM | POA: Diagnosis not present

## 2015-08-07 DIAGNOSIS — I10 Essential (primary) hypertension: Secondary | ICD-10-CM | POA: Diagnosis not present

## 2015-08-07 DIAGNOSIS — G609 Hereditary and idiopathic neuropathy, unspecified: Secondary | ICD-10-CM | POA: Diagnosis not present

## 2015-08-07 DIAGNOSIS — M069 Rheumatoid arthritis, unspecified: Secondary | ICD-10-CM | POA: Diagnosis not present

## 2015-08-11 DIAGNOSIS — N3000 Acute cystitis without hematuria: Secondary | ICD-10-CM | POA: Diagnosis not present

## 2015-08-11 DIAGNOSIS — S0101XD Laceration without foreign body of scalp, subsequent encounter: Secondary | ICD-10-CM | POA: Diagnosis not present

## 2015-08-11 DIAGNOSIS — F320 Major depressive disorder, single episode, mild: Secondary | ICD-10-CM | POA: Diagnosis not present

## 2015-08-11 DIAGNOSIS — F419 Anxiety disorder, unspecified: Secondary | ICD-10-CM | POA: Diagnosis not present

## 2015-08-18 DIAGNOSIS — F329 Major depressive disorder, single episode, unspecified: Secondary | ICD-10-CM | POA: Diagnosis not present

## 2015-08-18 DIAGNOSIS — M8008XD Age-related osteoporosis with current pathological fracture, vertebra(e), subsequent encounter for fracture with routine healing: Secondary | ICD-10-CM | POA: Diagnosis not present

## 2015-08-18 DIAGNOSIS — Z87440 Personal history of urinary (tract) infections: Secondary | ICD-10-CM | POA: Diagnosis not present

## 2015-08-18 DIAGNOSIS — Z96653 Presence of artificial knee joint, bilateral: Secondary | ICD-10-CM | POA: Diagnosis not present

## 2015-08-18 DIAGNOSIS — M069 Rheumatoid arthritis, unspecified: Secondary | ICD-10-CM | POA: Diagnosis not present

## 2015-08-18 DIAGNOSIS — R296 Repeated falls: Secondary | ICD-10-CM | POA: Diagnosis not present

## 2015-08-18 DIAGNOSIS — M4806 Spinal stenosis, lumbar region: Secondary | ICD-10-CM | POA: Diagnosis not present

## 2015-08-18 DIAGNOSIS — E43 Unspecified severe protein-calorie malnutrition: Secondary | ICD-10-CM | POA: Diagnosis not present

## 2015-08-18 DIAGNOSIS — M19042 Primary osteoarthritis, left hand: Secondary | ICD-10-CM | POA: Diagnosis not present

## 2015-08-18 DIAGNOSIS — K589 Irritable bowel syndrome without diarrhea: Secondary | ICD-10-CM | POA: Diagnosis not present

## 2015-08-18 DIAGNOSIS — M19041 Primary osteoarthritis, right hand: Secondary | ICD-10-CM | POA: Diagnosis not present

## 2015-08-18 DIAGNOSIS — N3281 Overactive bladder: Secondary | ICD-10-CM | POA: Diagnosis not present

## 2015-08-18 DIAGNOSIS — Z7982 Long term (current) use of aspirin: Secondary | ICD-10-CM | POA: Diagnosis not present

## 2015-08-18 DIAGNOSIS — F419 Anxiety disorder, unspecified: Secondary | ICD-10-CM | POA: Diagnosis not present

## 2015-08-18 DIAGNOSIS — H9190 Unspecified hearing loss, unspecified ear: Secondary | ICD-10-CM | POA: Diagnosis not present

## 2015-08-20 DIAGNOSIS — H353221 Exudative age-related macular degeneration, left eye, with active choroidal neovascularization: Secondary | ICD-10-CM | POA: Diagnosis not present

## 2015-08-27 DIAGNOSIS — M19042 Primary osteoarthritis, left hand: Secondary | ICD-10-CM | POA: Diagnosis not present

## 2015-08-27 DIAGNOSIS — R296 Repeated falls: Secondary | ICD-10-CM | POA: Diagnosis not present

## 2015-08-27 DIAGNOSIS — M8008XD Age-related osteoporosis with current pathological fracture, vertebra(e), subsequent encounter for fracture with routine healing: Secondary | ICD-10-CM | POA: Diagnosis not present

## 2015-08-27 DIAGNOSIS — M19041 Primary osteoarthritis, right hand: Secondary | ICD-10-CM | POA: Diagnosis not present

## 2015-08-27 DIAGNOSIS — M4806 Spinal stenosis, lumbar region: Secondary | ICD-10-CM | POA: Diagnosis not present

## 2015-08-27 DIAGNOSIS — M069 Rheumatoid arthritis, unspecified: Secondary | ICD-10-CM | POA: Diagnosis not present

## 2015-08-28 DIAGNOSIS — M8008XD Age-related osteoporosis with current pathological fracture, vertebra(e), subsequent encounter for fracture with routine healing: Secondary | ICD-10-CM | POA: Diagnosis not present

## 2015-08-28 DIAGNOSIS — M19042 Primary osteoarthritis, left hand: Secondary | ICD-10-CM | POA: Diagnosis not present

## 2015-08-28 DIAGNOSIS — R296 Repeated falls: Secondary | ICD-10-CM | POA: Diagnosis not present

## 2015-08-28 DIAGNOSIS — M4806 Spinal stenosis, lumbar region: Secondary | ICD-10-CM | POA: Diagnosis not present

## 2015-08-28 DIAGNOSIS — M19041 Primary osteoarthritis, right hand: Secondary | ICD-10-CM | POA: Diagnosis not present

## 2015-08-28 DIAGNOSIS — M069 Rheumatoid arthritis, unspecified: Secondary | ICD-10-CM | POA: Diagnosis not present

## 2015-09-01 DIAGNOSIS — M19041 Primary osteoarthritis, right hand: Secondary | ICD-10-CM | POA: Diagnosis not present

## 2015-09-01 DIAGNOSIS — R296 Repeated falls: Secondary | ICD-10-CM | POA: Diagnosis not present

## 2015-09-01 DIAGNOSIS — M069 Rheumatoid arthritis, unspecified: Secondary | ICD-10-CM | POA: Diagnosis not present

## 2015-09-01 DIAGNOSIS — M4806 Spinal stenosis, lumbar region: Secondary | ICD-10-CM | POA: Diagnosis not present

## 2015-09-01 DIAGNOSIS — M19042 Primary osteoarthritis, left hand: Secondary | ICD-10-CM | POA: Diagnosis not present

## 2015-09-01 DIAGNOSIS — M8008XD Age-related osteoporosis with current pathological fracture, vertebra(e), subsequent encounter for fracture with routine healing: Secondary | ICD-10-CM | POA: Diagnosis not present

## 2015-09-04 DIAGNOSIS — M19041 Primary osteoarthritis, right hand: Secondary | ICD-10-CM | POA: Diagnosis not present

## 2015-09-04 DIAGNOSIS — M4806 Spinal stenosis, lumbar region: Secondary | ICD-10-CM | POA: Diagnosis not present

## 2015-09-04 DIAGNOSIS — M19042 Primary osteoarthritis, left hand: Secondary | ICD-10-CM | POA: Diagnosis not present

## 2015-09-04 DIAGNOSIS — M069 Rheumatoid arthritis, unspecified: Secondary | ICD-10-CM | POA: Diagnosis not present

## 2015-09-04 DIAGNOSIS — M8008XD Age-related osteoporosis with current pathological fracture, vertebra(e), subsequent encounter for fracture with routine healing: Secondary | ICD-10-CM | POA: Diagnosis not present

## 2015-09-04 DIAGNOSIS — R296 Repeated falls: Secondary | ICD-10-CM | POA: Diagnosis not present

## 2015-09-07 DIAGNOSIS — M4806 Spinal stenosis, lumbar region: Secondary | ICD-10-CM | POA: Diagnosis not present

## 2015-09-07 DIAGNOSIS — M8008XD Age-related osteoporosis with current pathological fracture, vertebra(e), subsequent encounter for fracture with routine healing: Secondary | ICD-10-CM | POA: Diagnosis not present

## 2015-09-07 DIAGNOSIS — M069 Rheumatoid arthritis, unspecified: Secondary | ICD-10-CM | POA: Diagnosis not present

## 2015-09-07 DIAGNOSIS — R296 Repeated falls: Secondary | ICD-10-CM | POA: Diagnosis not present

## 2015-09-07 DIAGNOSIS — M19041 Primary osteoarthritis, right hand: Secondary | ICD-10-CM | POA: Diagnosis not present

## 2015-09-07 DIAGNOSIS — M19042 Primary osteoarthritis, left hand: Secondary | ICD-10-CM | POA: Diagnosis not present

## 2015-09-08 DIAGNOSIS — M0600 Rheumatoid arthritis without rheumatoid factor, unspecified site: Secondary | ICD-10-CM | POA: Diagnosis not present

## 2015-09-08 DIAGNOSIS — L02411 Cutaneous abscess of right axilla: Secondary | ICD-10-CM | POA: Diagnosis not present

## 2015-09-15 DIAGNOSIS — M069 Rheumatoid arthritis, unspecified: Secondary | ICD-10-CM | POA: Diagnosis not present

## 2015-09-15 DIAGNOSIS — M19042 Primary osteoarthritis, left hand: Secondary | ICD-10-CM | POA: Diagnosis not present

## 2015-09-15 DIAGNOSIS — M8008XD Age-related osteoporosis with current pathological fracture, vertebra(e), subsequent encounter for fracture with routine healing: Secondary | ICD-10-CM | POA: Diagnosis not present

## 2015-09-15 DIAGNOSIS — M4806 Spinal stenosis, lumbar region: Secondary | ICD-10-CM | POA: Diagnosis not present

## 2015-09-15 DIAGNOSIS — R296 Repeated falls: Secondary | ICD-10-CM | POA: Diagnosis not present

## 2015-09-15 DIAGNOSIS — M19041 Primary osteoarthritis, right hand: Secondary | ICD-10-CM | POA: Diagnosis not present

## 2015-09-17 DIAGNOSIS — M069 Rheumatoid arthritis, unspecified: Secondary | ICD-10-CM | POA: Diagnosis not present

## 2015-09-17 DIAGNOSIS — M8008XD Age-related osteoporosis with current pathological fracture, vertebra(e), subsequent encounter for fracture with routine healing: Secondary | ICD-10-CM | POA: Diagnosis not present

## 2015-09-17 DIAGNOSIS — M19041 Primary osteoarthritis, right hand: Secondary | ICD-10-CM | POA: Diagnosis not present

## 2015-09-17 DIAGNOSIS — M19042 Primary osteoarthritis, left hand: Secondary | ICD-10-CM | POA: Diagnosis not present

## 2015-09-17 DIAGNOSIS — R296 Repeated falls: Secondary | ICD-10-CM | POA: Diagnosis not present

## 2015-09-17 DIAGNOSIS — M4806 Spinal stenosis, lumbar region: Secondary | ICD-10-CM | POA: Diagnosis not present

## 2015-09-21 DIAGNOSIS — M19042 Primary osteoarthritis, left hand: Secondary | ICD-10-CM | POA: Diagnosis not present

## 2015-09-21 DIAGNOSIS — R296 Repeated falls: Secondary | ICD-10-CM | POA: Diagnosis not present

## 2015-09-21 DIAGNOSIS — M8008XD Age-related osteoporosis with current pathological fracture, vertebra(e), subsequent encounter for fracture with routine healing: Secondary | ICD-10-CM | POA: Diagnosis not present

## 2015-09-21 DIAGNOSIS — M19041 Primary osteoarthritis, right hand: Secondary | ICD-10-CM | POA: Diagnosis not present

## 2015-09-21 DIAGNOSIS — M069 Rheumatoid arthritis, unspecified: Secondary | ICD-10-CM | POA: Diagnosis not present

## 2015-09-21 DIAGNOSIS — M4806 Spinal stenosis, lumbar region: Secondary | ICD-10-CM | POA: Diagnosis not present

## 2015-09-23 DIAGNOSIS — M8008XD Age-related osteoporosis with current pathological fracture, vertebra(e), subsequent encounter for fracture with routine healing: Secondary | ICD-10-CM | POA: Diagnosis not present

## 2015-09-23 DIAGNOSIS — M4806 Spinal stenosis, lumbar region: Secondary | ICD-10-CM | POA: Diagnosis not present

## 2015-09-23 DIAGNOSIS — M069 Rheumatoid arthritis, unspecified: Secondary | ICD-10-CM | POA: Diagnosis not present

## 2015-09-23 DIAGNOSIS — R296 Repeated falls: Secondary | ICD-10-CM | POA: Diagnosis not present

## 2015-09-23 DIAGNOSIS — M19042 Primary osteoarthritis, left hand: Secondary | ICD-10-CM | POA: Diagnosis not present

## 2015-09-23 DIAGNOSIS — M19041 Primary osteoarthritis, right hand: Secondary | ICD-10-CM | POA: Diagnosis not present

## 2015-09-28 DIAGNOSIS — M19042 Primary osteoarthritis, left hand: Secondary | ICD-10-CM | POA: Diagnosis not present

## 2015-09-28 DIAGNOSIS — R296 Repeated falls: Secondary | ICD-10-CM | POA: Diagnosis not present

## 2015-09-28 DIAGNOSIS — M4806 Spinal stenosis, lumbar region: Secondary | ICD-10-CM | POA: Diagnosis not present

## 2015-09-28 DIAGNOSIS — M19041 Primary osteoarthritis, right hand: Secondary | ICD-10-CM | POA: Diagnosis not present

## 2015-09-28 DIAGNOSIS — M069 Rheumatoid arthritis, unspecified: Secondary | ICD-10-CM | POA: Diagnosis not present

## 2015-09-28 DIAGNOSIS — M8008XD Age-related osteoporosis with current pathological fracture, vertebra(e), subsequent encounter for fracture with routine healing: Secondary | ICD-10-CM | POA: Diagnosis not present

## 2015-09-29 DIAGNOSIS — H353221 Exudative age-related macular degeneration, left eye, with active choroidal neovascularization: Secondary | ICD-10-CM | POA: Diagnosis not present

## 2015-10-01 ENCOUNTER — Encounter (HOSPITAL_COMMUNITY): Payer: Self-pay | Admitting: Family Medicine

## 2015-10-01 ENCOUNTER — Emergency Department (HOSPITAL_COMMUNITY)
Admission: EM | Admit: 2015-10-01 | Discharge: 2015-10-01 | Disposition: A | Discharge status: Skilled Nursing Facility | Payer: Medicare Other | Attending: Emergency Medicine | Admitting: Emergency Medicine

## 2015-10-01 ENCOUNTER — Emergency Department (HOSPITAL_COMMUNITY): Payer: Medicare Other

## 2015-10-01 DIAGNOSIS — R296 Repeated falls: Secondary | ICD-10-CM | POA: Diagnosis not present

## 2015-10-01 DIAGNOSIS — W1830XA Fall on same level, unspecified, initial encounter: Secondary | ICD-10-CM | POA: Diagnosis not present

## 2015-10-01 DIAGNOSIS — S51011A Laceration without foreign body of right elbow, initial encounter: Secondary | ICD-10-CM

## 2015-10-01 DIAGNOSIS — I10 Essential (primary) hypertension: Secondary | ICD-10-CM | POA: Diagnosis not present

## 2015-10-01 DIAGNOSIS — M8008XD Age-related osteoporosis with current pathological fracture, vertebra(e), subsequent encounter for fracture with routine healing: Secondary | ICD-10-CM | POA: Diagnosis not present

## 2015-10-01 DIAGNOSIS — M4806 Spinal stenosis, lumbar region: Secondary | ICD-10-CM | POA: Diagnosis not present

## 2015-10-01 DIAGNOSIS — Y999 Unspecified external cause status: Secondary | ICD-10-CM | POA: Insufficient documentation

## 2015-10-01 DIAGNOSIS — S0990XA Unspecified injury of head, initial encounter: Secondary | ICD-10-CM | POA: Insufficient documentation

## 2015-10-01 DIAGNOSIS — Y939 Activity, unspecified: Secondary | ICD-10-CM | POA: Diagnosis not present

## 2015-10-01 DIAGNOSIS — Z7982 Long term (current) use of aspirin: Secondary | ICD-10-CM | POA: Diagnosis not present

## 2015-10-01 DIAGNOSIS — S0181XA Laceration without foreign body of other part of head, initial encounter: Secondary | ICD-10-CM | POA: Insufficient documentation

## 2015-10-01 DIAGNOSIS — M19042 Primary osteoarthritis, left hand: Secondary | ICD-10-CM | POA: Diagnosis not present

## 2015-10-01 DIAGNOSIS — Y929 Unspecified place or not applicable: Secondary | ICD-10-CM | POA: Insufficient documentation

## 2015-10-01 DIAGNOSIS — S0191XA Laceration without foreign body of unspecified part of head, initial encounter: Secondary | ICD-10-CM | POA: Diagnosis not present

## 2015-10-01 DIAGNOSIS — S098XXA Other specified injuries of head, initial encounter: Secondary | ICD-10-CM | POA: Diagnosis not present

## 2015-10-01 DIAGNOSIS — M19041 Primary osteoarthritis, right hand: Secondary | ICD-10-CM | POA: Diagnosis not present

## 2015-10-01 DIAGNOSIS — S0083XA Contusion of other part of head, initial encounter: Secondary | ICD-10-CM

## 2015-10-01 DIAGNOSIS — I2510 Atherosclerotic heart disease of native coronary artery without angina pectoris: Secondary | ICD-10-CM | POA: Insufficient documentation

## 2015-10-01 DIAGNOSIS — S0003XA Contusion of scalp, initial encounter: Secondary | ICD-10-CM | POA: Diagnosis not present

## 2015-10-01 DIAGNOSIS — Z96652 Presence of left artificial knee joint: Secondary | ICD-10-CM | POA: Insufficient documentation

## 2015-10-01 DIAGNOSIS — Z79899 Other long term (current) drug therapy: Secondary | ICD-10-CM | POA: Diagnosis not present

## 2015-10-01 DIAGNOSIS — M069 Rheumatoid arthritis, unspecified: Secondary | ICD-10-CM | POA: Diagnosis not present

## 2015-10-01 LAB — BASIC METABOLIC PANEL
ANION GAP: 3 — AB (ref 5–15)
BUN: 16 mg/dL (ref 6–20)
CO2: 27 mmol/L (ref 22–32)
Calcium: 9 mg/dL (ref 8.9–10.3)
Chloride: 106 mmol/L (ref 101–111)
Creatinine, Ser: 0.87 mg/dL (ref 0.44–1.00)
GFR, EST NON AFRICAN AMERICAN: 58 mL/min — AB (ref >60)
GLUCOSE: 117 mg/dL — AB (ref 65–99)
POTASSIUM: 4.2 mmol/L (ref 3.5–5.1)
Sodium: 136 mmol/L (ref 135–145)

## 2015-10-01 LAB — CBC WITH DIFFERENTIAL/PLATELET
BASOS ABS: 0 10*3/uL (ref 0.0–0.1)
BASOS PCT: 0 %
Eosinophils Absolute: 0.2 10*3/uL (ref 0.0–0.7)
Eosinophils Relative: 2 %
HEMATOCRIT: 36.4 % (ref 36.0–46.0)
Hemoglobin: 11 g/dL — ABNORMAL LOW (ref 12.0–15.0)
LYMPHS PCT: 14 %
Lymphs Abs: 1 10*3/uL (ref 0.7–4.0)
MCH: 29.3 pg (ref 26.0–34.0)
MCHC: 30.2 g/dL (ref 30.0–36.0)
MCV: 96.8 fL (ref 78.0–100.0)
Monocytes Absolute: 0.9 10*3/uL (ref 0.1–1.0)
Monocytes Relative: 12 %
NEUTROS ABS: 5.2 10*3/uL (ref 1.7–7.7)
NEUTROS PCT: 72 %
Platelets: 227 10*3/uL (ref 150–400)
RBC: 3.76 MIL/uL — AB (ref 3.87–5.11)
RDW: 13.6 % (ref 11.5–15.5)
WBC: 7.3 10*3/uL (ref 4.0–10.5)

## 2015-10-01 MED ORDER — BACITRACIN ZINC 500 UNIT/GM EX OINT: 1.0000 "application " | TOPICAL_OINTMENT | Freq: Two times a day (BID) | CUTANEOUS | 0 refills | Status: DC

## 2015-10-01 MED ORDER — LIDOCAINE-EPINEPHRINE (PF) 2 %-1:200000 IJ SOLN
10.0000 mL | Freq: Once | INTRAMUSCULAR | Status: AC
  Administered 2015-10-01: 10 mL via INTRADERMAL
  Filled 2015-10-01: qty 20

## 2015-10-01 NOTE — ED Notes (Signed)
Pt. waiting for [facility] to transport back to nursing home , report given to nurse at [facility] .
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
## 2015-10-01 NOTE — ED Triage Notes (Signed)
Pt presents from [facility] on [address] with c/o fall today.  She was trying to retrieve something from a cabinet and fell down - no LOC, no dizziness/lightheadedness, not taking blood thinners. Presents with hematomna and laceration to right forehead and skin tear to right elbow.  No neck/back pain that is abnormal for her (has spinal stenosis with chronic back pain).  PT is A&Ox4, PERRLA.

## 2015-10-01 NOTE — ED Notes (Signed)
MD at bedside. 

## 2015-10-02 DIAGNOSIS — R269 Unspecified abnormalities of gait and mobility: Secondary | ICD-10-CM | POA: Diagnosis not present

## 2015-10-02 DIAGNOSIS — M810 Age-related osteoporosis without current pathological fracture: Secondary | ICD-10-CM | POA: Diagnosis not present

## 2015-10-02 DIAGNOSIS — G629 Polyneuropathy, unspecified: Secondary | ICD-10-CM | POA: Diagnosis not present

## 2015-10-02 DIAGNOSIS — M069 Rheumatoid arthritis, unspecified: Secondary | ICD-10-CM | POA: Diagnosis not present

## 2015-10-02 DIAGNOSIS — M1990 Unspecified osteoarthritis, unspecified site: Secondary | ICD-10-CM | POA: Diagnosis not present

## 2015-10-02 DIAGNOSIS — R296 Repeated falls: Secondary | ICD-10-CM | POA: Diagnosis not present

## 2015-10-02 DIAGNOSIS — I2510 Atherosclerotic heart disease of native coronary artery without angina pectoris: Secondary | ICD-10-CM | POA: Diagnosis not present

## 2015-10-02 DIAGNOSIS — N3281 Overactive bladder: Secondary | ICD-10-CM | POA: Diagnosis not present

## 2015-10-02 DIAGNOSIS — H04129 Dry eye syndrome of unspecified lacrimal gland: Secondary | ICD-10-CM | POA: Diagnosis not present

## 2015-10-02 DIAGNOSIS — E785 Hyperlipidemia, unspecified: Secondary | ICD-10-CM | POA: Diagnosis not present

## 2015-10-06 DIAGNOSIS — M8008XD Age-related osteoporosis with current pathological fracture, vertebra(e), subsequent encounter for fracture with routine healing: Secondary | ICD-10-CM | POA: Diagnosis not present

## 2015-10-06 DIAGNOSIS — M4806 Spinal stenosis, lumbar region: Secondary | ICD-10-CM | POA: Diagnosis not present

## 2015-10-06 DIAGNOSIS — M069 Rheumatoid arthritis, unspecified: Secondary | ICD-10-CM | POA: Diagnosis not present

## 2015-10-06 DIAGNOSIS — M19042 Primary osteoarthritis, left hand: Secondary | ICD-10-CM | POA: Diagnosis not present

## 2015-10-06 DIAGNOSIS — R296 Repeated falls: Secondary | ICD-10-CM | POA: Diagnosis not present

## 2015-10-06 DIAGNOSIS — M19041 Primary osteoarthritis, right hand: Secondary | ICD-10-CM | POA: Diagnosis not present

## 2015-10-08 DIAGNOSIS — R296 Repeated falls: Secondary | ICD-10-CM | POA: Diagnosis not present

## 2015-10-08 DIAGNOSIS — M19042 Primary osteoarthritis, left hand: Secondary | ICD-10-CM | POA: Diagnosis not present

## 2015-10-08 DIAGNOSIS — M8008XD Age-related osteoporosis with current pathological fracture, vertebra(e), subsequent encounter for fracture with routine healing: Secondary | ICD-10-CM | POA: Diagnosis not present

## 2015-10-08 DIAGNOSIS — M19041 Primary osteoarthritis, right hand: Secondary | ICD-10-CM | POA: Diagnosis not present

## 2015-10-08 DIAGNOSIS — M069 Rheumatoid arthritis, unspecified: Secondary | ICD-10-CM | POA: Diagnosis not present

## 2015-10-08 DIAGNOSIS — M4806 Spinal stenosis, lumbar region: Secondary | ICD-10-CM | POA: Diagnosis not present

## 2015-10-12 DIAGNOSIS — M8008XD Age-related osteoporosis with current pathological fracture, vertebra(e), subsequent encounter for fracture with routine healing: Secondary | ICD-10-CM | POA: Diagnosis not present

## 2015-10-12 DIAGNOSIS — M4806 Spinal stenosis, lumbar region: Secondary | ICD-10-CM | POA: Diagnosis not present

## 2015-10-12 DIAGNOSIS — M069 Rheumatoid arthritis, unspecified: Secondary | ICD-10-CM | POA: Diagnosis not present

## 2015-10-12 DIAGNOSIS — M19041 Primary osteoarthritis, right hand: Secondary | ICD-10-CM | POA: Diagnosis not present

## 2015-10-12 DIAGNOSIS — M19042 Primary osteoarthritis, left hand: Secondary | ICD-10-CM | POA: Diagnosis not present

## 2015-10-12 DIAGNOSIS — R296 Repeated falls: Secondary | ICD-10-CM | POA: Diagnosis not present

## 2015-10-15 DIAGNOSIS — R296 Repeated falls: Secondary | ICD-10-CM | POA: Diagnosis not present

## 2015-10-15 DIAGNOSIS — M069 Rheumatoid arthritis, unspecified: Secondary | ICD-10-CM | POA: Diagnosis not present

## 2015-10-15 DIAGNOSIS — M4806 Spinal stenosis, lumbar region: Secondary | ICD-10-CM | POA: Diagnosis not present

## 2015-10-15 DIAGNOSIS — M19041 Primary osteoarthritis, right hand: Secondary | ICD-10-CM | POA: Diagnosis not present

## 2015-10-15 DIAGNOSIS — M19042 Primary osteoarthritis, left hand: Secondary | ICD-10-CM | POA: Diagnosis not present

## 2015-10-15 DIAGNOSIS — M8008XD Age-related osteoporosis with current pathological fracture, vertebra(e), subsequent encounter for fracture with routine healing: Secondary | ICD-10-CM | POA: Diagnosis not present

## 2015-10-16 DIAGNOSIS — M19041 Primary osteoarthritis, right hand: Secondary | ICD-10-CM | POA: Diagnosis not present

## 2015-10-16 DIAGNOSIS — M8008XD Age-related osteoporosis with current pathological fracture, vertebra(e), subsequent encounter for fracture with routine healing: Secondary | ICD-10-CM | POA: Diagnosis not present

## 2015-10-16 DIAGNOSIS — M069 Rheumatoid arthritis, unspecified: Secondary | ICD-10-CM | POA: Diagnosis not present

## 2015-10-16 DIAGNOSIS — R296 Repeated falls: Secondary | ICD-10-CM | POA: Diagnosis not present

## 2015-10-16 DIAGNOSIS — M19042 Primary osteoarthritis, left hand: Secondary | ICD-10-CM | POA: Diagnosis not present

## 2015-10-16 DIAGNOSIS — M4806 Spinal stenosis, lumbar region: Secondary | ICD-10-CM | POA: Diagnosis not present

## 2015-10-17 DIAGNOSIS — F419 Anxiety disorder, unspecified: Secondary | ICD-10-CM | POA: Diagnosis not present

## 2015-10-17 DIAGNOSIS — F329 Major depressive disorder, single episode, unspecified: Secondary | ICD-10-CM | POA: Diagnosis not present

## 2015-10-17 DIAGNOSIS — M25512 Pain in left shoulder: Secondary | ICD-10-CM | POA: Diagnosis not present

## 2015-10-17 DIAGNOSIS — M069 Rheumatoid arthritis, unspecified: Secondary | ICD-10-CM | POA: Diagnosis not present

## 2015-10-17 DIAGNOSIS — Z96653 Presence of artificial knee joint, bilateral: Secondary | ICD-10-CM | POA: Diagnosis not present

## 2015-10-17 DIAGNOSIS — Z7982 Long term (current) use of aspirin: Secondary | ICD-10-CM | POA: Diagnosis not present

## 2015-10-17 DIAGNOSIS — M4806 Spinal stenosis, lumbar region: Secondary | ICD-10-CM | POA: Diagnosis not present

## 2015-10-17 DIAGNOSIS — E43 Unspecified severe protein-calorie malnutrition: Secondary | ICD-10-CM | POA: Diagnosis not present

## 2015-10-17 DIAGNOSIS — M19042 Primary osteoarthritis, left hand: Secondary | ICD-10-CM | POA: Diagnosis not present

## 2015-10-17 DIAGNOSIS — M19041 Primary osteoarthritis, right hand: Secondary | ICD-10-CM | POA: Diagnosis not present

## 2015-10-17 DIAGNOSIS — M25511 Pain in right shoulder: Secondary | ICD-10-CM | POA: Diagnosis not present

## 2015-10-17 DIAGNOSIS — H9190 Unspecified hearing loss, unspecified ear: Secondary | ICD-10-CM | POA: Diagnosis not present

## 2015-10-19 DIAGNOSIS — M4806 Spinal stenosis, lumbar region: Secondary | ICD-10-CM | POA: Diagnosis not present

## 2015-10-19 DIAGNOSIS — M25512 Pain in left shoulder: Secondary | ICD-10-CM | POA: Diagnosis not present

## 2015-10-19 DIAGNOSIS — M25511 Pain in right shoulder: Secondary | ICD-10-CM | POA: Diagnosis not present

## 2015-10-19 DIAGNOSIS — M19042 Primary osteoarthritis, left hand: Secondary | ICD-10-CM | POA: Diagnosis not present

## 2015-10-19 DIAGNOSIS — M19041 Primary osteoarthritis, right hand: Secondary | ICD-10-CM | POA: Diagnosis not present

## 2015-10-19 DIAGNOSIS — M069 Rheumatoid arthritis, unspecified: Secondary | ICD-10-CM | POA: Diagnosis not present

## 2015-10-21 DIAGNOSIS — M25512 Pain in left shoulder: Secondary | ICD-10-CM | POA: Diagnosis not present

## 2015-10-21 DIAGNOSIS — M069 Rheumatoid arthritis, unspecified: Secondary | ICD-10-CM | POA: Diagnosis not present

## 2015-10-21 DIAGNOSIS — M19042 Primary osteoarthritis, left hand: Secondary | ICD-10-CM | POA: Diagnosis not present

## 2015-10-21 DIAGNOSIS — M19041 Primary osteoarthritis, right hand: Secondary | ICD-10-CM | POA: Diagnosis not present

## 2015-10-21 DIAGNOSIS — M25511 Pain in right shoulder: Secondary | ICD-10-CM | POA: Diagnosis not present

## 2015-10-21 DIAGNOSIS — M4806 Spinal stenosis, lumbar region: Secondary | ICD-10-CM | POA: Diagnosis not present

## 2015-10-26 DIAGNOSIS — M4806 Spinal stenosis, lumbar region: Secondary | ICD-10-CM | POA: Diagnosis not present

## 2015-10-26 DIAGNOSIS — M25512 Pain in left shoulder: Secondary | ICD-10-CM | POA: Diagnosis not present

## 2015-10-26 DIAGNOSIS — M069 Rheumatoid arthritis, unspecified: Secondary | ICD-10-CM | POA: Diagnosis not present

## 2015-10-26 DIAGNOSIS — F411 Generalized anxiety disorder: Secondary | ICD-10-CM | POA: Diagnosis not present

## 2015-10-26 DIAGNOSIS — M25511 Pain in right shoulder: Secondary | ICD-10-CM | POA: Diagnosis not present

## 2015-10-26 DIAGNOSIS — M19041 Primary osteoarthritis, right hand: Secondary | ICD-10-CM | POA: Diagnosis not present

## 2015-10-26 DIAGNOSIS — M19042 Primary osteoarthritis, left hand: Secondary | ICD-10-CM | POA: Diagnosis not present

## 2015-10-28 DIAGNOSIS — M25511 Pain in right shoulder: Secondary | ICD-10-CM | POA: Diagnosis not present

## 2015-10-28 DIAGNOSIS — M069 Rheumatoid arthritis, unspecified: Secondary | ICD-10-CM | POA: Diagnosis not present

## 2015-10-28 DIAGNOSIS — M25512 Pain in left shoulder: Secondary | ICD-10-CM | POA: Diagnosis not present

## 2015-10-28 DIAGNOSIS — M19042 Primary osteoarthritis, left hand: Secondary | ICD-10-CM | POA: Diagnosis not present

## 2015-10-28 DIAGNOSIS — M19041 Primary osteoarthritis, right hand: Secondary | ICD-10-CM | POA: Diagnosis not present

## 2015-10-28 DIAGNOSIS — M4806 Spinal stenosis, lumbar region: Secondary | ICD-10-CM | POA: Diagnosis not present

## 2015-10-29 DIAGNOSIS — E785 Hyperlipidemia, unspecified: Secondary | ICD-10-CM | POA: Diagnosis not present

## 2015-10-29 DIAGNOSIS — I2510 Atherosclerotic heart disease of native coronary artery without angina pectoris: Secondary | ICD-10-CM | POA: Diagnosis not present

## 2015-10-29 DIAGNOSIS — N3281 Overactive bladder: Secondary | ICD-10-CM | POA: Diagnosis not present

## 2015-10-29 DIAGNOSIS — H04129 Dry eye syndrome of unspecified lacrimal gland: Secondary | ICD-10-CM | POA: Diagnosis not present

## 2015-10-30 DIAGNOSIS — M810 Age-related osteoporosis without current pathological fracture: Secondary | ICD-10-CM | POA: Diagnosis not present

## 2015-10-30 DIAGNOSIS — G629 Polyneuropathy, unspecified: Secondary | ICD-10-CM | POA: Diagnosis not present

## 2015-10-30 DIAGNOSIS — R269 Unspecified abnormalities of gait and mobility: Secondary | ICD-10-CM | POA: Diagnosis not present

## 2015-10-30 DIAGNOSIS — E785 Hyperlipidemia, unspecified: Secondary | ICD-10-CM | POA: Diagnosis not present

## 2015-10-30 DIAGNOSIS — R296 Repeated falls: Secondary | ICD-10-CM | POA: Diagnosis not present

## 2015-10-30 DIAGNOSIS — M1990 Unspecified osteoarthritis, unspecified site: Secondary | ICD-10-CM | POA: Diagnosis not present

## 2015-10-30 DIAGNOSIS — M069 Rheumatoid arthritis, unspecified: Secondary | ICD-10-CM | POA: Diagnosis not present

## 2015-10-30 DIAGNOSIS — B379 Candidiasis, unspecified: Secondary | ICD-10-CM | POA: Diagnosis not present

## 2015-10-30 DIAGNOSIS — N3281 Overactive bladder: Secondary | ICD-10-CM | POA: Diagnosis not present

## 2015-10-30 DIAGNOSIS — H04129 Dry eye syndrome of unspecified lacrimal gland: Secondary | ICD-10-CM | POA: Diagnosis not present

## 2015-10-30 DIAGNOSIS — I2510 Atherosclerotic heart disease of native coronary artery without angina pectoris: Secondary | ICD-10-CM | POA: Diagnosis not present

## 2015-11-16 DIAGNOSIS — F411 Generalized anxiety disorder: Secondary | ICD-10-CM | POA: Diagnosis not present

## 2015-11-17 DIAGNOSIS — Z23 Encounter for immunization: Secondary | ICD-10-CM | POA: Diagnosis not present

## 2015-11-17 DIAGNOSIS — R296 Repeated falls: Secondary | ICD-10-CM | POA: Diagnosis not present

## 2015-11-17 DIAGNOSIS — R300 Dysuria: Secondary | ICD-10-CM | POA: Diagnosis not present

## 2015-11-17 DIAGNOSIS — M0600 Rheumatoid arthritis without rheumatoid factor, unspecified site: Secondary | ICD-10-CM | POA: Diagnosis not present

## 2015-11-17 DIAGNOSIS — L97521 Non-pressure chronic ulcer of other part of left foot limited to breakdown of skin: Secondary | ICD-10-CM | POA: Diagnosis not present

## 2015-11-17 DIAGNOSIS — H353221 Exudative age-related macular degeneration, left eye, with active choroidal neovascularization: Secondary | ICD-10-CM | POA: Diagnosis not present

## 2015-11-20 ENCOUNTER — Emergency Department (HOSPITAL_COMMUNITY): Payer: Medicare Other

## 2015-11-20 ENCOUNTER — Emergency Department (HOSPITAL_COMMUNITY)
Admission: EM | Admit: 2015-11-20 | Discharge: 2015-11-20 | Disposition: A | Discharge status: Home / Self Care | Payer: Medicare Other | Attending: Emergency Medicine | Admitting: Emergency Medicine

## 2015-11-20 ENCOUNTER — Encounter (HOSPITAL_COMMUNITY): Payer: Self-pay | Admitting: Emergency Medicine

## 2015-11-20 DIAGNOSIS — S59901A Unspecified injury of right elbow, initial encounter: Secondary | ICD-10-CM | POA: Diagnosis not present

## 2015-11-20 DIAGNOSIS — M25561 Pain in right knee: Secondary | ICD-10-CM | POA: Diagnosis not present

## 2015-11-20 DIAGNOSIS — Y999 Unspecified external cause status: Secondary | ICD-10-CM | POA: Diagnosis not present

## 2015-11-20 DIAGNOSIS — Y939 Activity, unspecified: Secondary | ICD-10-CM | POA: Diagnosis not present

## 2015-11-20 DIAGNOSIS — S52571A Other intraarticular fracture of lower end of right radius, initial encounter for closed fracture: Secondary | ICD-10-CM | POA: Diagnosis not present

## 2015-11-20 DIAGNOSIS — I2510 Atherosclerotic heart disease of native coronary artery without angina pectoris: Secondary | ICD-10-CM | POA: Insufficient documentation

## 2015-11-20 DIAGNOSIS — Y92129 Unspecified place in nursing home as the place of occurrence of the external cause: Secondary | ICD-10-CM | POA: Insufficient documentation

## 2015-11-20 DIAGNOSIS — Z96653 Presence of artificial knee joint, bilateral: Secondary | ICD-10-CM | POA: Insufficient documentation

## 2015-11-20 DIAGNOSIS — R259 Unspecified abnormal involuntary movements: Secondary | ICD-10-CM | POA: Diagnosis not present

## 2015-11-20 DIAGNOSIS — I10 Essential (primary) hypertension: Secondary | ICD-10-CM | POA: Diagnosis not present

## 2015-11-20 DIAGNOSIS — S52531A Colles' fracture of right radius, initial encounter for closed fracture: Secondary | ICD-10-CM | POA: Diagnosis not present

## 2015-11-20 DIAGNOSIS — Z7982 Long term (current) use of aspirin: Secondary | ICD-10-CM | POA: Diagnosis not present

## 2015-11-20 DIAGNOSIS — W1830XA Fall on same level, unspecified, initial encounter: Secondary | ICD-10-CM | POA: Insufficient documentation

## 2015-11-20 DIAGNOSIS — S2241XA Multiple fractures of ribs, right side, initial encounter for closed fracture: Secondary | ICD-10-CM | POA: Insufficient documentation

## 2015-11-20 DIAGNOSIS — S59911A Unspecified injury of right forearm, initial encounter: Secondary | ICD-10-CM | POA: Diagnosis present

## 2015-11-20 DIAGNOSIS — S299XXA Unspecified injury of thorax, initial encounter: Secondary | ICD-10-CM | POA: Diagnosis not present

## 2015-11-20 DIAGNOSIS — M25569 Pain in unspecified knee: Secondary | ICD-10-CM | POA: Diagnosis not present

## 2015-11-20 DIAGNOSIS — S52501A Unspecified fracture of the lower end of right radius, initial encounter for closed fracture: Secondary | ICD-10-CM | POA: Diagnosis not present

## 2015-11-20 MED ORDER — OXYCODONE HCL 5 MG PO TABS
5.0000 mg | ORAL_TABLET | Freq: Once | ORAL | Status: AC
  Administered 2015-11-20: 5 mg via ORAL
  Filled 2015-11-20: qty 1

## 2015-11-20 MED ORDER — FENTANYL CITRATE (PF) 100 MCG/2ML IJ SOLN
50.0000 ug | Freq: Once | INTRAMUSCULAR | Status: AC
  Administered 2015-11-20: 50 ug via INTRAVENOUS
  Filled 2015-11-20: qty 2

## 2015-11-20 NOTE — ED Notes (Signed)
Tracy Sharp, case mgmt aware of patient request for wheelchair

## 2015-11-20 NOTE — ED Notes (Signed)
PTAr onsite to pick up patient.

## 2015-11-20 NOTE — ED Notes (Signed)
Changed the patient's sheets and chucks with the assistance of [place], Therapist, sports and [place], Therapist, sports.

## 2015-11-20 NOTE — Discharge Instructions (Signed)
Please use FIVE mg of the oxycodone for the severe pain associated with the fractures

## 2015-11-20 NOTE — ED Notes (Signed)
Mariann Laster with case manager in to speak with family ref. wheelchair

## 2015-11-20 NOTE — ED Notes (Signed)
MD states patient needs to return to a SNF.  Spoke to Case Management who will follow up

## 2015-11-20 NOTE — ED Notes (Signed)
Ice Pack given, pt waiting for PTAR at this time

## 2015-11-20 NOTE — ED Provider Notes (Signed)
6:40 PM Jody has seen, back to her living facility with resources, fill out face to face. She is already on oxycodone. Does not appear in significant pain currently. Stable for d/c home   Sherwood Gambler, MD 11/20/15 1950

## 2015-11-20 NOTE — ED Triage Notes (Signed)
Unwitnessed fall at nursing home today c/o pain left wrist, does have deformity but has good pulse and neuro but fingers on both hands cool to touch, also c/o rt leg and knee pain, states legs gave way, has old abrasion to rt side of head by eye that per PTAr is old, has rash under her breasts that has been there

## 2015-11-20 NOTE — ED Notes (Signed)
Social Work at bedside 

## 2015-11-20 NOTE — ED Notes (Signed)
Report called to [place] at [facility]

## 2015-11-20 NOTE — Progress Notes (Addendum)
[place] services arranged with [facility], orders sent back to facility with patient, as requested per nurse at facility. Wheel chair ordered to assist with mobility.  CM spoke with daughter regarding w/c Referral sent to [facility] after hours DME liaison at [facility]  will contact daughter Tracy Sharp [phone] regarding her picking the w/c from [facility]. No further CM needs identified

## 2015-11-20 NOTE — ED Notes (Signed)
Followed up with care management.  Working on SNF options

## 2015-11-20 NOTE — ED Notes (Signed)
Pt requesting pain med.  Med given

## 2015-11-20 NOTE — ED Notes (Signed)
MD okay'd pt to eat.  Soft foods tray ordered and snack provided for wait.

## 2015-11-20 NOTE — ED Notes (Addendum)
Report called to [place] on [place].  Made aware of pt's status and needs, and will provide information for ordering home health through [facility] and Social Work.

## 2015-11-20 NOTE — ED Notes (Signed)
Contacted [facility] for tx to [facility]

## 2015-11-20 NOTE — ED Notes (Signed)
Rt wrist  has obvious deformity, swelling and is tender to touch, c/o rt leg pain and knee pain but no reddness or swelling of lower rt extremity
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
## 2015-11-20 NOTE — ED Notes (Signed)
Ortho tech in with pt putting splint on.

## 2015-11-20 NOTE — Progress Notes (Signed)
Orthopedic Tech Progress Note Patient Details:  Tracy Sharp [birth year] [id number]  Ortho Devices Type of Ortho Device: Ace wrap, Arm sling, Sugartong splint Ortho Device/Splint Location: RUE Ortho Device/Splint Interventions: Ordered, Application   Braulio Bosch 11/20/2015, 3:41 PM

## 2015-11-20 NOTE — Progress Notes (Addendum)
CSW spoke with pt and her daughter re: pt's disposition.  Pt does not have a qualifying  IP stay and Medicare will not cover a SNF stay for pt.  Pt/dtr do not wish to private pay for SNF at this time.  Plan is for pt to d/c back to [facility] with [facility] [place] providing [facility] services.

## 2015-11-20 NOTE — ED Notes (Signed)
Spoke to  Nursing home health and wellness person Hermina Staggers to make  Her nursing home aware that pt had fx ribs and fx wrist bones ulna and radius and she would be in splint.  Wanted to make sure they were aware that she could get pneumonia from fx ribs and to watch for s/s of that ie sob and increased weakness, shawana states understanding , pts daughter aware also

## 2015-11-21 DIAGNOSIS — M25569 Pain in unspecified knee: Secondary | ICD-10-CM | POA: Diagnosis not present

## 2015-11-21 DIAGNOSIS — R259 Unspecified abnormal involuntary movements: Secondary | ICD-10-CM | POA: Diagnosis not present

## 2015-11-23 DIAGNOSIS — S2241XD Multiple fractures of ribs, right side, subsequent encounter for fracture with routine healing: Secondary | ICD-10-CM | POA: Diagnosis not present

## 2015-11-23 DIAGNOSIS — W010XXD Fall on same level from slipping, tripping and stumbling without subsequent striking against object, subsequent encounter: Secondary | ICD-10-CM | POA: Diagnosis not present

## 2015-11-23 DIAGNOSIS — S52531D Colles' fracture of right radius, subsequent encounter for closed fracture with routine healing: Secondary | ICD-10-CM | POA: Diagnosis not present

## 2015-11-23 DIAGNOSIS — S52611D Displaced fracture of right ulna styloid process, subsequent encounter for closed fracture with routine healing: Secondary | ICD-10-CM | POA: Diagnosis not present

## 2015-11-24 ENCOUNTER — Ambulatory Visit: Payer: Medicare Other | Admitting: Podiatry

## 2015-11-25 DIAGNOSIS — S52571A Other intraarticular fracture of lower end of right radius, initial encounter for closed fracture: Secondary | ICD-10-CM | POA: Diagnosis not present

## 2015-11-26 DIAGNOSIS — S52611D Displaced fracture of right ulna styloid process, subsequent encounter for closed fracture with routine healing: Secondary | ICD-10-CM | POA: Diagnosis not present

## 2015-11-26 DIAGNOSIS — R200 Anesthesia of skin: Secondary | ICD-10-CM | POA: Diagnosis not present

## 2015-11-26 DIAGNOSIS — S52351D Displaced comminuted fracture of shaft of radius, right arm, subsequent encounter for closed fracture with routine healing: Secondary | ICD-10-CM | POA: Diagnosis not present

## 2015-11-26 DIAGNOSIS — S2241XD Multiple fractures of ribs, right side, subsequent encounter for fracture with routine healing: Secondary | ICD-10-CM | POA: Diagnosis not present

## 2015-11-26 DIAGNOSIS — M0600 Rheumatoid arthritis without rheumatoid factor, unspecified site: Secondary | ICD-10-CM | POA: Diagnosis not present

## 2015-11-26 DIAGNOSIS — W19XXXD Unspecified fall, subsequent encounter: Secondary | ICD-10-CM | POA: Diagnosis not present

## 2015-11-27 ENCOUNTER — Other Ambulatory Visit: Payer: Self-pay | Admitting: Family Medicine

## 2015-11-27 DIAGNOSIS — R200 Anesthesia of skin: Secondary | ICD-10-CM

## 2015-12-01 DIAGNOSIS — H04129 Dry eye syndrome of unspecified lacrimal gland: Secondary | ICD-10-CM | POA: Diagnosis not present

## 2015-12-01 DIAGNOSIS — E785 Hyperlipidemia, unspecified: Secondary | ICD-10-CM | POA: Diagnosis not present

## 2015-12-01 DIAGNOSIS — I2510 Atherosclerotic heart disease of native coronary artery without angina pectoris: Secondary | ICD-10-CM | POA: Diagnosis not present

## 2015-12-01 DIAGNOSIS — N3281 Overactive bladder: Secondary | ICD-10-CM | POA: Diagnosis not present

## 2015-12-02 ENCOUNTER — Ambulatory Visit: Payer: Medicare Other | Admitting: Podiatry

## 2015-12-04 ENCOUNTER — Ambulatory Visit: Payer: Medicare Other | Admitting: Physical Medicine & Rehabilitation

## 2015-12-08 DIAGNOSIS — S52571D Other intraarticular fracture of lower end of right radius, subsequent encounter for closed fracture with routine healing: Secondary | ICD-10-CM | POA: Diagnosis not present

## 2015-12-10 ENCOUNTER — Other Ambulatory Visit: Payer: Medicare Other

## 2015-12-14 ENCOUNTER — Encounter: Payer: Self-pay | Admitting: Physical Medicine & Rehabilitation

## 2015-12-14 ENCOUNTER — Ambulatory Visit (HOSPITAL_BASED_OUTPATIENT_CLINIC_OR_DEPARTMENT_OTHER): Payer: Medicare Other | Admitting: Physical Medicine & Rehabilitation

## 2015-12-14 ENCOUNTER — Encounter: Payer: Medicare Other | Attending: Physical Medicine & Rehabilitation

## 2015-12-14 VITALS — BP 147/73 | HR 80 | Resp 14

## 2015-12-14 DIAGNOSIS — M25561 Pain in right knee: Secondary | ICD-10-CM | POA: Diagnosis not present

## 2015-12-14 DIAGNOSIS — M48061 Spinal stenosis, lumbar region without neurogenic claudication: Secondary | ICD-10-CM | POA: Diagnosis not present

## 2015-12-14 DIAGNOSIS — K589 Irritable bowel syndrome without diarrhea: Secondary | ICD-10-CM | POA: Insufficient documentation

## 2015-12-14 DIAGNOSIS — I10 Essential (primary) hypertension: Secondary | ICD-10-CM | POA: Diagnosis not present

## 2015-12-14 DIAGNOSIS — M810 Age-related osteoporosis without current pathological fracture: Secondary | ICD-10-CM | POA: Diagnosis not present

## 2015-12-14 DIAGNOSIS — M25512 Pain in left shoulder: Secondary | ICD-10-CM | POA: Diagnosis not present

## 2015-12-14 DIAGNOSIS — M25511 Pain in right shoulder: Secondary | ICD-10-CM | POA: Diagnosis not present

## 2015-12-14 DIAGNOSIS — M069 Rheumatoid arthritis, unspecified: Secondary | ICD-10-CM | POA: Insufficient documentation

## 2015-12-14 DIAGNOSIS — M48062 Spinal stenosis, lumbar region with neurogenic claudication: Secondary | ICD-10-CM | POA: Diagnosis not present

## 2015-12-14 DIAGNOSIS — Z96651 Presence of right artificial knee joint: Secondary | ICD-10-CM | POA: Insufficient documentation

## 2015-12-14 DIAGNOSIS — G8928 Other chronic postprocedural pain: Secondary | ICD-10-CM

## 2015-12-14 DIAGNOSIS — F419 Anxiety disorder, unspecified: Secondary | ICD-10-CM | POA: Insufficient documentation

## 2015-12-14 DIAGNOSIS — F329 Major depressive disorder, single episode, unspecified: Secondary | ICD-10-CM | POA: Insufficient documentation

## 2015-12-14 DIAGNOSIS — G8929 Other chronic pain: Secondary | ICD-10-CM | POA: Insufficient documentation

## 2015-12-14 DIAGNOSIS — G609 Hereditary and idiopathic neuropathy, unspecified: Secondary | ICD-10-CM | POA: Diagnosis not present

## 2015-12-14 DIAGNOSIS — M4316 Spondylolisthesis, lumbar region: Secondary | ICD-10-CM | POA: Insufficient documentation

## 2015-12-14 DIAGNOSIS — I2510 Atherosclerotic heart disease of native coronary artery without angina pectoris: Secondary | ICD-10-CM | POA: Diagnosis not present

## 2015-12-14 DIAGNOSIS — E785 Hyperlipidemia, unspecified: Secondary | ICD-10-CM | POA: Diagnosis not present

## 2015-12-14 NOTE — Patient Instructions (Signed)
Stay in Wheelchair unless someone is with you  May practice arm and leg exercise and movement while seated in chair  Will schedule for R knee nerve blocks to help with pain in R knee
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
## 2015-12-15 DIAGNOSIS — F411 Generalized anxiety disorder: Secondary | ICD-10-CM | POA: Diagnosis not present

## 2015-12-17 DIAGNOSIS — B351 Tinea unguium: Secondary | ICD-10-CM | POA: Diagnosis not present

## 2015-12-17 DIAGNOSIS — L853 Xerosis cutis: Secondary | ICD-10-CM | POA: Diagnosis not present

## 2015-12-17 DIAGNOSIS — M2010 Hallux valgus (acquired), unspecified foot: Secondary | ICD-10-CM | POA: Diagnosis not present

## 2015-12-17 DIAGNOSIS — L84 Corns and callosities: Secondary | ICD-10-CM | POA: Diagnosis not present

## 2015-12-17 DIAGNOSIS — F339 Major depressive disorder, recurrent, unspecified: Secondary | ICD-10-CM | POA: Diagnosis not present

## 2015-12-17 DIAGNOSIS — R600 Localized edema: Secondary | ICD-10-CM | POA: Diagnosis not present

## 2015-12-17 DIAGNOSIS — Q845 Enlarged and hypertrophic nails: Secondary | ICD-10-CM | POA: Diagnosis not present

## 2015-12-17 DIAGNOSIS — F419 Anxiety disorder, unspecified: Secondary | ICD-10-CM | POA: Diagnosis not present

## 2015-12-17 DIAGNOSIS — L603 Nail dystrophy: Secondary | ICD-10-CM | POA: Diagnosis not present

## 2015-12-22 ENCOUNTER — Other Ambulatory Visit: Payer: Medicare Other

## 2015-12-28 DIAGNOSIS — I2510 Atherosclerotic heart disease of native coronary artery without angina pectoris: Secondary | ICD-10-CM | POA: Diagnosis not present

## 2015-12-28 DIAGNOSIS — N3281 Overactive bladder: Secondary | ICD-10-CM | POA: Diagnosis not present

## 2015-12-28 DIAGNOSIS — H04129 Dry eye syndrome of unspecified lacrimal gland: Secondary | ICD-10-CM | POA: Diagnosis not present

## 2015-12-28 DIAGNOSIS — E785 Hyperlipidemia, unspecified: Secondary | ICD-10-CM | POA: Diagnosis not present

## 2015-12-30 ENCOUNTER — Ambulatory Visit: Payer: Medicare Other | Admitting: Podiatry

## 2016-01-05 ENCOUNTER — Ambulatory Visit (HOSPITAL_BASED_OUTPATIENT_CLINIC_OR_DEPARTMENT_OTHER): Payer: Medicare Other | Admitting: Physical Medicine & Rehabilitation

## 2016-01-05 ENCOUNTER — Encounter: Payer: Medicare Other | Attending: Physical Medicine & Rehabilitation

## 2016-01-05 VITALS — BP 113/69 | HR 90

## 2016-01-05 DIAGNOSIS — M48061 Spinal stenosis, lumbar region without neurogenic claudication: Secondary | ICD-10-CM | POA: Diagnosis not present

## 2016-01-05 DIAGNOSIS — M4316 Spondylolisthesis, lumbar region: Secondary | ICD-10-CM | POA: Insufficient documentation

## 2016-01-05 DIAGNOSIS — E785 Hyperlipidemia, unspecified: Secondary | ICD-10-CM | POA: Insufficient documentation

## 2016-01-05 DIAGNOSIS — I10 Essential (primary) hypertension: Secondary | ICD-10-CM | POA: Insufficient documentation

## 2016-01-05 DIAGNOSIS — F411 Generalized anxiety disorder: Secondary | ICD-10-CM | POA: Diagnosis not present

## 2016-01-05 DIAGNOSIS — M25561 Pain in right knee: Secondary | ICD-10-CM | POA: Diagnosis not present

## 2016-01-05 DIAGNOSIS — G8929 Other chronic pain: Secondary | ICD-10-CM | POA: Diagnosis not present

## 2016-01-05 DIAGNOSIS — F419 Anxiety disorder, unspecified: Secondary | ICD-10-CM | POA: Insufficient documentation

## 2016-01-05 DIAGNOSIS — I2510 Atherosclerotic heart disease of native coronary artery without angina pectoris: Secondary | ICD-10-CM | POA: Insufficient documentation

## 2016-01-05 DIAGNOSIS — G8928 Other chronic postprocedural pain: Secondary | ICD-10-CM | POA: Diagnosis not present

## 2016-01-05 DIAGNOSIS — M810 Age-related osteoporosis without current pathological fracture: Secondary | ICD-10-CM | POA: Insufficient documentation

## 2016-01-05 DIAGNOSIS — M25512 Pain in left shoulder: Secondary | ICD-10-CM | POA: Insufficient documentation

## 2016-01-05 DIAGNOSIS — K589 Irritable bowel syndrome without diarrhea: Secondary | ICD-10-CM | POA: Insufficient documentation

## 2016-01-05 DIAGNOSIS — Z96651 Presence of right artificial knee joint: Secondary | ICD-10-CM | POA: Insufficient documentation

## 2016-01-05 DIAGNOSIS — M069 Rheumatoid arthritis, unspecified: Secondary | ICD-10-CM | POA: Insufficient documentation

## 2016-01-05 DIAGNOSIS — F329 Major depressive disorder, single episode, unspecified: Secondary | ICD-10-CM | POA: Diagnosis not present

## 2016-01-05 DIAGNOSIS — M25511 Pain in right shoulder: Secondary | ICD-10-CM | POA: Diagnosis not present

## 2016-01-05 NOTE — Progress Notes (Signed)
  [facility]   Name: BEIJA BASICH DOB:1925/01/27 MRN: [id number]  Date:01/05/2016  Physician: Alysia Penna, MD    Nurse/CMA: Uvaldo Rybacki,CMA  Allergies:  Allergies  Allergen Reactions  . Doxycycline Other (See Comments)    intolerance  . Hydrocodone Other (See Comments)    intolerance  . Norco [Hydrocodone-Acetaminophen] Other (See Comments)    Nausea, dizziness  . Toviaz [Fesoterodine Fumarate Er] Other (See Comments)    dizzy  . Plaquenil [Hydroxychloroquine] Rash    Consent Signed: Yes.    Is patient diabetic? No.  CBG today?   Pregnant: No. LMP: No LMP recorded. Patient is postmenopausal. (age 36-55)  Anticoagulants: no Anti-inflammatory: yes (tylenol, AM) Antibiotics: no  Procedure: right genicular nerve block Position: Supine Start Time: 1:52pm  End Time: 2:06pm  Fluoro Time: 30  RN/CMA Bright, CMA Noreta Kue, CMA    Time 1:35pm 2:13pm    BP 113/69 106/62    Pulse 88 82    Respirations 14 14    O2 Sat 96 97    S/S 6 6    Pain Level 6/10 0/10     D/C home with assisted living transportation, patient A & O X 3, D/C instructions reviewed, and sits independently.

## 2016-01-05 NOTE — Progress Notes (Signed)
genicular nerve blocks under fluoroscopic guidance  Indication Chronic severe post operative knee pain that has not responded to PT, medication, and other conservative care.  Informed consent was obtained after discussing risks and benefits of procedure with the patient.  These include bleeding, bruising and infection as well as foot numbness. Pt place in supine position on the fluoro table .  Static images identified distal femur and proximal tibia.  Lateral and medial supracondylar , as well as medial tibial flare prepped with betadine.  Marked under fluoro and then a 25 g 1.5 inch needle was used to anesthetize skin and subcutaneous tissue. 1.5 cc was infiltrate into each of 3 sites. Then a 22-gauge 3.5 inch needle was inserted under fluoroscopic guidance first targeting the medial tibial flare midpoint. After bone contact was made lateral images confirmed proper positioning and Omnipaque 180 times one ML was injected. This showed no evidence of intravascular uptake. Then 2 ML of the solution containing one ML of Celestone 6 mg per mL with 4 ML of 2% lidocaine was injected. This same procedure was treated for the lateral and medial supracondylar areas. Patient tolerated procedure well. Post procedure instructions given.

## 2016-01-05 NOTE — Patient Instructions (Signed)
Genicular nerve blocks were performed today. This is to block pain signals from the knee. A local anesthetic was used to block the knee therefore this will not be a permanent procedure. Please keep track of your pain today and compared to the pain that you had prior to the injection. At next visit we will discuss the results. If it is helpful but only short-term we would need to confirm this with an additional injection prior to proceeding with a radiofrequency neurotomy  °

## 2016-01-06 DIAGNOSIS — S52571D Other intraarticular fracture of lower end of right radius, subsequent encounter for closed fracture with routine healing: Secondary | ICD-10-CM | POA: Diagnosis not present

## 2016-01-07 DIAGNOSIS — F419 Anxiety disorder, unspecified: Secondary | ICD-10-CM | POA: Diagnosis not present

## 2016-01-07 DIAGNOSIS — F339 Major depressive disorder, recurrent, unspecified: Secondary | ICD-10-CM | POA: Diagnosis not present

## 2016-01-08 DIAGNOSIS — N390 Urinary tract infection, site not specified: Secondary | ICD-10-CM | POA: Diagnosis not present

## 2016-01-12 DIAGNOSIS — F411 Generalized anxiety disorder: Secondary | ICD-10-CM | POA: Diagnosis not present

## 2016-01-19 DIAGNOSIS — F411 Generalized anxiety disorder: Secondary | ICD-10-CM | POA: Diagnosis not present

## 2016-01-21 DIAGNOSIS — H353221 Exudative age-related macular degeneration, left eye, with active choroidal neovascularization: Secondary | ICD-10-CM | POA: Diagnosis not present

## 2016-01-22 DIAGNOSIS — S52351D Displaced comminuted fracture of shaft of radius, right arm, subsequent encounter for closed fracture with routine healing: Secondary | ICD-10-CM | POA: Diagnosis not present

## 2016-01-22 DIAGNOSIS — S2241XD Multiple fractures of ribs, right side, subsequent encounter for fracture with routine healing: Secondary | ICD-10-CM | POA: Diagnosis not present

## 2016-01-22 DIAGNOSIS — M6281 Muscle weakness (generalized): Secondary | ICD-10-CM | POA: Diagnosis not present

## 2016-01-22 DIAGNOSIS — G629 Polyneuropathy, unspecified: Secondary | ICD-10-CM | POA: Diagnosis not present

## 2016-01-27 DIAGNOSIS — R269 Unspecified abnormalities of gait and mobility: Secondary | ICD-10-CM | POA: Diagnosis not present

## 2016-01-27 DIAGNOSIS — E785 Hyperlipidemia, unspecified: Secondary | ICD-10-CM | POA: Diagnosis not present

## 2016-01-27 DIAGNOSIS — N3281 Overactive bladder: Secondary | ICD-10-CM | POA: Diagnosis not present

## 2016-01-27 DIAGNOSIS — H04129 Dry eye syndrome of unspecified lacrimal gland: Secondary | ICD-10-CM | POA: Diagnosis not present

## 2016-01-27 DIAGNOSIS — I2510 Atherosclerotic heart disease of native coronary artery without angina pectoris: Secondary | ICD-10-CM | POA: Diagnosis not present

## 2016-01-27 DIAGNOSIS — M1990 Unspecified osteoarthritis, unspecified site: Secondary | ICD-10-CM | POA: Diagnosis not present

## 2016-01-27 DIAGNOSIS — M810 Age-related osteoporosis without current pathological fracture: Secondary | ICD-10-CM | POA: Diagnosis not present

## 2016-01-27 DIAGNOSIS — G629 Polyneuropathy, unspecified: Secondary | ICD-10-CM | POA: Diagnosis not present

## 2016-02-02 ENCOUNTER — Ambulatory Visit: Payer: Medicare Other | Admitting: Physical Medicine & Rehabilitation

## 2016-02-02 DIAGNOSIS — F411 Generalized anxiety disorder: Secondary | ICD-10-CM | POA: Diagnosis not present

## 2016-02-08 DIAGNOSIS — M80031D Age-related osteoporosis with current pathological fracture, right forearm, subsequent encounter for fracture with routine healing: Secondary | ICD-10-CM | POA: Diagnosis not present

## 2016-02-08 DIAGNOSIS — M8000XD Age-related osteoporosis with current pathological fracture, unspecified site, subsequent encounter for fracture with routine healing: Secondary | ICD-10-CM | POA: Diagnosis not present

## 2016-02-08 DIAGNOSIS — I2510 Atherosclerotic heart disease of native coronary artery without angina pectoris: Secondary | ICD-10-CM | POA: Diagnosis not present

## 2016-02-08 DIAGNOSIS — I10 Essential (primary) hypertension: Secondary | ICD-10-CM | POA: Diagnosis not present

## 2016-02-08 DIAGNOSIS — M1990 Unspecified osteoarthritis, unspecified site: Secondary | ICD-10-CM | POA: Diagnosis not present

## 2016-02-08 DIAGNOSIS — M6281 Muscle weakness (generalized): Secondary | ICD-10-CM | POA: Diagnosis not present

## 2016-02-09 ENCOUNTER — Ambulatory Visit: Payer: Medicare Other | Admitting: Physical Medicine & Rehabilitation

## 2016-02-09 ENCOUNTER — Ambulatory Visit: Payer: Medicare Other

## 2016-02-09 DIAGNOSIS — F411 Generalized anxiety disorder: Secondary | ICD-10-CM | POA: Diagnosis not present

## 2016-02-11 DIAGNOSIS — F339 Major depressive disorder, recurrent, unspecified: Secondary | ICD-10-CM | POA: Diagnosis not present

## 2016-02-11 DIAGNOSIS — F419 Anxiety disorder, unspecified: Secondary | ICD-10-CM | POA: Diagnosis not present

## 2016-02-15 ENCOUNTER — Encounter (HOSPITAL_COMMUNITY): Payer: Self-pay | Admitting: Emergency Medicine

## 2016-02-15 ENCOUNTER — Emergency Department (HOSPITAL_COMMUNITY)
Admission: EM | Admit: 2016-02-15 | Discharge: 2016-02-15 | Disposition: A | Discharge status: Home / Self Care | Payer: Medicare Other | Attending: Emergency Medicine | Admitting: Emergency Medicine

## 2016-02-15 ENCOUNTER — Emergency Department (HOSPITAL_COMMUNITY): Payer: Medicare Other

## 2016-02-15 DIAGNOSIS — G4489 Other headache syndrome: Secondary | ICD-10-CM | POA: Diagnosis not present

## 2016-02-15 DIAGNOSIS — Y929 Unspecified place or not applicable: Secondary | ICD-10-CM | POA: Diagnosis not present

## 2016-02-15 DIAGNOSIS — Z7982 Long term (current) use of aspirin: Secondary | ICD-10-CM | POA: Diagnosis not present

## 2016-02-15 DIAGNOSIS — S0083XA Contusion of other part of head, initial encounter: Secondary | ICD-10-CM | POA: Diagnosis not present

## 2016-02-15 DIAGNOSIS — I2510 Atherosclerotic heart disease of native coronary artery without angina pectoris: Secondary | ICD-10-CM | POA: Diagnosis not present

## 2016-02-15 DIAGNOSIS — I10 Essential (primary) hypertension: Secondary | ICD-10-CM | POA: Diagnosis not present

## 2016-02-15 DIAGNOSIS — Z79899 Other long term (current) drug therapy: Secondary | ICD-10-CM | POA: Diagnosis not present

## 2016-02-15 DIAGNOSIS — Z96653 Presence of artificial knee joint, bilateral: Secondary | ICD-10-CM | POA: Insufficient documentation

## 2016-02-15 DIAGNOSIS — Y939 Activity, unspecified: Secondary | ICD-10-CM | POA: Insufficient documentation

## 2016-02-15 DIAGNOSIS — S0990XA Unspecified injury of head, initial encounter: Secondary | ICD-10-CM

## 2016-02-15 DIAGNOSIS — Y999 Unspecified external cause status: Secondary | ICD-10-CM | POA: Diagnosis not present

## 2016-02-15 DIAGNOSIS — W050XXA Fall from non-moving wheelchair, initial encounter: Secondary | ICD-10-CM | POA: Insufficient documentation

## 2016-02-15 DIAGNOSIS — S0993XA Unspecified injury of face, initial encounter: Secondary | ICD-10-CM | POA: Diagnosis not present

## 2016-02-15 DIAGNOSIS — R259 Unspecified abnormal involuntary movements: Secondary | ICD-10-CM | POA: Diagnosis not present

## 2016-02-15 DIAGNOSIS — W19XXXA Unspecified fall, initial encounter: Secondary | ICD-10-CM

## 2016-02-15 DIAGNOSIS — S199XXA Unspecified injury of neck, initial encounter: Secondary | ICD-10-CM | POA: Diagnosis not present

## 2016-02-15 DIAGNOSIS — R2689 Other abnormalities of gait and mobility: Secondary | ICD-10-CM | POA: Diagnosis not present
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
## 2016-02-15 NOTE — ED Triage Notes (Signed)
Patient is from [facility] facility.  She slide out of her recliner on right side.  Patient states she hit head on right side.  She states she has no pain.  Facility requested patient come to ER for evaluation.  She is not on any blood thinners.   BP:  142/82 P:74 R:16  CBG:122

## 2016-02-15 NOTE — ED Notes (Signed)
Patient is A & O x3.

## 2016-02-15 NOTE — ED Notes (Addendum)
Spoke to facility to inform them patient is coming back once [facility] arrives.  [facility] has been contacted.

## 2016-02-15 NOTE — ED Notes (Signed)
Left message for facility to contact me back regarding transporting patient back to facility.

## 2016-02-15 NOTE — Discharge Instructions (Signed)
Return here as needed.  Follow-up with your primary doctor. °

## 2016-02-16 DIAGNOSIS — F411 Generalized anxiety disorder: Secondary | ICD-10-CM | POA: Diagnosis not present

## 2016-02-22 ENCOUNTER — Encounter: Payer: Self-pay | Admitting: Physical Medicine & Rehabilitation

## 2016-02-22 ENCOUNTER — Encounter: Payer: Medicare Other | Attending: Physical Medicine & Rehabilitation

## 2016-02-22 ENCOUNTER — Ambulatory Visit (HOSPITAL_BASED_OUTPATIENT_CLINIC_OR_DEPARTMENT_OTHER): Payer: Medicare Other | Admitting: Physical Medicine & Rehabilitation

## 2016-02-22 VITALS — BP 106/66 | HR 81 | Resp 14

## 2016-02-22 DIAGNOSIS — I10 Essential (primary) hypertension: Secondary | ICD-10-CM | POA: Diagnosis not present

## 2016-02-22 DIAGNOSIS — F419 Anxiety disorder, unspecified: Secondary | ICD-10-CM | POA: Insufficient documentation

## 2016-02-22 DIAGNOSIS — M25512 Pain in left shoulder: Secondary | ICD-10-CM | POA: Insufficient documentation

## 2016-02-22 DIAGNOSIS — M48062 Spinal stenosis, lumbar region with neurogenic claudication: Secondary | ICD-10-CM | POA: Diagnosis not present

## 2016-02-22 DIAGNOSIS — M25561 Pain in right knee: Secondary | ICD-10-CM

## 2016-02-22 DIAGNOSIS — G8929 Other chronic pain: Secondary | ICD-10-CM | POA: Insufficient documentation

## 2016-02-22 DIAGNOSIS — Z96651 Presence of right artificial knee joint: Secondary | ICD-10-CM | POA: Diagnosis not present

## 2016-02-22 DIAGNOSIS — F329 Major depressive disorder, single episode, unspecified: Secondary | ICD-10-CM | POA: Diagnosis not present

## 2016-02-22 DIAGNOSIS — M069 Rheumatoid arthritis, unspecified: Secondary | ICD-10-CM | POA: Diagnosis not present

## 2016-02-22 DIAGNOSIS — M4316 Spondylolisthesis, lumbar region: Secondary | ICD-10-CM | POA: Diagnosis not present

## 2016-02-22 DIAGNOSIS — K589 Irritable bowel syndrome without diarrhea: Secondary | ICD-10-CM | POA: Insufficient documentation

## 2016-02-22 DIAGNOSIS — M810 Age-related osteoporosis without current pathological fracture: Secondary | ICD-10-CM | POA: Insufficient documentation

## 2016-02-22 DIAGNOSIS — M48061 Spinal stenosis, lumbar region without neurogenic claudication: Secondary | ICD-10-CM | POA: Diagnosis not present

## 2016-02-22 DIAGNOSIS — I2510 Atherosclerotic heart disease of native coronary artery without angina pectoris: Secondary | ICD-10-CM | POA: Diagnosis not present

## 2016-02-22 DIAGNOSIS — M25511 Pain in right shoulder: Secondary | ICD-10-CM | POA: Insufficient documentation

## 2016-02-22 DIAGNOSIS — E785 Hyperlipidemia, unspecified: Secondary | ICD-10-CM | POA: Insufficient documentation

## 2016-02-22 DIAGNOSIS — G8928 Other chronic postprocedural pain: Secondary | ICD-10-CM

## 2016-02-22 NOTE — Patient Instructions (Signed)
Repeat nerve block is only short acting, we may need to burn the nerves. The following procedure
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
## 2016-02-23 DIAGNOSIS — F411 Generalized anxiety disorder: Secondary | ICD-10-CM | POA: Diagnosis not present

## 2016-02-25 DIAGNOSIS — S52571D Other intraarticular fracture of lower end of right radius, subsequent encounter for closed fracture with routine healing: Secondary | ICD-10-CM | POA: Diagnosis not present

## 2016-02-29 DIAGNOSIS — N3281 Overactive bladder: Secondary | ICD-10-CM | POA: Diagnosis not present

## 2016-02-29 DIAGNOSIS — H04129 Dry eye syndrome of unspecified lacrimal gland: Secondary | ICD-10-CM | POA: Diagnosis not present

## 2016-02-29 DIAGNOSIS — M1990 Unspecified osteoarthritis, unspecified site: Secondary | ICD-10-CM | POA: Diagnosis not present

## 2016-02-29 DIAGNOSIS — R269 Unspecified abnormalities of gait and mobility: Secondary | ICD-10-CM | POA: Diagnosis not present

## 2016-02-29 DIAGNOSIS — G629 Polyneuropathy, unspecified: Secondary | ICD-10-CM | POA: Diagnosis not present

## 2016-02-29 DIAGNOSIS — M810 Age-related osteoporosis without current pathological fracture: Secondary | ICD-10-CM | POA: Diagnosis not present

## 2016-02-29 DIAGNOSIS — I2510 Atherosclerotic heart disease of native coronary artery without angina pectoris: Secondary | ICD-10-CM | POA: Diagnosis not present

## 2016-02-29 DIAGNOSIS — E785 Hyperlipidemia, unspecified: Secondary | ICD-10-CM | POA: Diagnosis not present

## 2016-03-01 DIAGNOSIS — F411 Generalized anxiety disorder: Secondary | ICD-10-CM | POA: Diagnosis not present

## 2016-03-08 DIAGNOSIS — E559 Vitamin D deficiency, unspecified: Secondary | ICD-10-CM | POA: Diagnosis not present

## 2016-03-08 DIAGNOSIS — R296 Repeated falls: Secondary | ICD-10-CM | POA: Diagnosis not present

## 2016-03-08 DIAGNOSIS — M810 Age-related osteoporosis without current pathological fracture: Secondary | ICD-10-CM | POA: Diagnosis not present

## 2016-03-08 DIAGNOSIS — G894 Chronic pain syndrome: Secondary | ICD-10-CM | POA: Diagnosis not present

## 2016-03-08 DIAGNOSIS — M0600 Rheumatoid arthritis without rheumatoid factor, unspecified site: Secondary | ICD-10-CM | POA: Diagnosis not present

## 2016-03-08 DIAGNOSIS — F411 Generalized anxiety disorder: Secondary | ICD-10-CM | POA: Diagnosis not present

## 2016-03-10 ENCOUNTER — Encounter: Payer: Self-pay | Admitting: Physical Medicine & Rehabilitation

## 2016-03-10 ENCOUNTER — Ambulatory Visit (HOSPITAL_BASED_OUTPATIENT_CLINIC_OR_DEPARTMENT_OTHER): Payer: Medicare Other | Admitting: Physical Medicine & Rehabilitation

## 2016-03-10 ENCOUNTER — Encounter: Payer: Medicare Other | Attending: Physical Medicine & Rehabilitation

## 2016-03-10 VITALS — BP 110/63 | HR 76 | Resp 14

## 2016-03-10 DIAGNOSIS — Z96651 Presence of right artificial knee joint: Secondary | ICD-10-CM | POA: Insufficient documentation

## 2016-03-10 DIAGNOSIS — M25512 Pain in left shoulder: Secondary | ICD-10-CM | POA: Insufficient documentation

## 2016-03-10 DIAGNOSIS — I10 Essential (primary) hypertension: Secondary | ICD-10-CM | POA: Diagnosis not present

## 2016-03-10 DIAGNOSIS — H353221 Exudative age-related macular degeneration, left eye, with active choroidal neovascularization: Secondary | ICD-10-CM | POA: Diagnosis not present

## 2016-03-10 DIAGNOSIS — E785 Hyperlipidemia, unspecified: Secondary | ICD-10-CM | POA: Diagnosis not present

## 2016-03-10 DIAGNOSIS — M069 Rheumatoid arthritis, unspecified: Secondary | ICD-10-CM | POA: Diagnosis not present

## 2016-03-10 DIAGNOSIS — M25511 Pain in right shoulder: Secondary | ICD-10-CM | POA: Diagnosis not present

## 2016-03-10 DIAGNOSIS — I2510 Atherosclerotic heart disease of native coronary artery without angina pectoris: Secondary | ICD-10-CM | POA: Diagnosis not present

## 2016-03-10 DIAGNOSIS — F329 Major depressive disorder, single episode, unspecified: Secondary | ICD-10-CM | POA: Diagnosis not present

## 2016-03-10 DIAGNOSIS — G8929 Other chronic pain: Secondary | ICD-10-CM | POA: Insufficient documentation

## 2016-03-10 DIAGNOSIS — M25561 Pain in right knee: Secondary | ICD-10-CM | POA: Insufficient documentation

## 2016-03-10 DIAGNOSIS — K589 Irritable bowel syndrome without diarrhea: Secondary | ICD-10-CM | POA: Diagnosis not present

## 2016-03-10 DIAGNOSIS — M810 Age-related osteoporosis without current pathological fracture: Secondary | ICD-10-CM | POA: Diagnosis not present

## 2016-03-10 DIAGNOSIS — M48061 Spinal stenosis, lumbar region without neurogenic claudication: Secondary | ICD-10-CM | POA: Insufficient documentation

## 2016-03-10 DIAGNOSIS — F419 Anxiety disorder, unspecified: Secondary | ICD-10-CM | POA: Insufficient documentation

## 2016-03-10 DIAGNOSIS — M2351 Chronic instability of knee, right knee: Secondary | ICD-10-CM | POA: Insufficient documentation

## 2016-03-10 DIAGNOSIS — M4316 Spondylolisthesis, lumbar region: Secondary | ICD-10-CM | POA: Insufficient documentation

## 2016-03-10 DIAGNOSIS — F339 Major depressive disorder, recurrent, unspecified: Secondary | ICD-10-CM | POA: Diagnosis not present

## 2016-03-10 NOTE — Progress Notes (Deleted)
  [facility]   Name: Tracy Sharp DOB:July 26, 1924 MRN: [id number]  Date:03/10/2016  Physician: Alysia Penna, MD    Nurse/CMA: Yolanda Bonine Allergies:  Allergies  Allergen Reactions  . Doxycycline Other (See Comments)    intolerance  . Hydrocodone Other (See Comments)    intolerance  . Norco [Hydrocodone-Acetaminophen] Other (See Comments)    Nausea, dizziness  . Toviaz [Fesoterodine Fumarate Er] Other (See Comments)    dizzy  . Plaquenil [Hydroxychloroquine] Rash    Consent Signed: Yes.    Is patient diabetic? No.  CBG today?   Pregnant: No. LMP: No LMP recorded. Patient is postmenopausal. (age 60-55)  Anticoagulants: no Anti-inflammatory: no Antibiotics: no  Procedure: Genicular nerve Right knee  Position: Supine Start Time:  End Time: Fluoro Time:  RN/CMA Janet Berlin    Time 13:10     BP 110/63     Pulse 76     Respirations 14     O2 Sat 97     S/S 6 6    Pain Level 5      D/C home with [name], patient A & O X 3, D/C instructions reviewed, and sits independently.

## 2016-03-10 NOTE — Progress Notes (Signed)
83 year old female with history of bilateral total knee replacement. She has had chronic right knee pain despite surgery. She has had orthopedic follow-up and there was no instability noted in the knee, at least from a hardware standpoint  Patient also has spinal stenosis affecting the L1, L2 and L4-L5 levels.  She has been wheelchair-bound for the last couple months and her knee pain has been better. She did have genicular nerve blocks performed 01/05/2016, which alleviated right knee pain, although it is difficult to get a timeframe on this because of the patient's cognition.  Has had 3 falls, one of which resulted in ED visit. 2 of the falls were from the wheelchair. CT head, maxillofacial, as well as cervical spine showed no acute findings. There was evidence of small vessel disease bilaterally C5-C6 ACDF. Visualized Medication list reviewed  Past medical history as listed above. Also, question of polyneuropathy  Review of systems  Limited  due to memory issues, chronic: right knee without pain currently.  Examination No acute distress. Mood and affect appropriate. Right knee. No tenderness to palpation. There is limited flexion of the knee as well as extension of the knee. Flexion goes to 90, extension minus 10. There is mild medial lateral instability bilaterally. Healed knee scars bilaterally.  Impression 1. Right knee instability. Currently, pain does not seem to be an issue. Instability is likely due to weakness/debility combined with spinal stenosis. Will write for a right knee orthosis, which is to be worn during physical therapy. Restart physical therapy after orthosis is obtained. Return to clinic in 2 months. Patient still requires assistance of one to 2 people for transfers. She is to be a wheelchair when unattended  Minimize sedating medications, because of cognitive issues and poor safety awareness

## 2016-03-10 NOTE — Patient Instructions (Signed)
Please get the knee orthosis before you restart PT

## 2016-03-15 DIAGNOSIS — F411 Generalized anxiety disorder: Secondary | ICD-10-CM | POA: Diagnosis not present

## 2016-03-21 DIAGNOSIS — F411 Generalized anxiety disorder: Secondary | ICD-10-CM | POA: Diagnosis not present

## 2016-03-23 ENCOUNTER — Telehealth: Payer: Self-pay

## 2016-03-23 DIAGNOSIS — Z9181 History of falling: Secondary | ICD-10-CM | POA: Diagnosis not present

## 2016-03-23 DIAGNOSIS — Z96653 Presence of artificial knee joint, bilateral: Secondary | ICD-10-CM | POA: Diagnosis not present

## 2016-03-23 DIAGNOSIS — Z993 Dependence on wheelchair: Secondary | ICD-10-CM | POA: Diagnosis not present

## 2016-03-23 DIAGNOSIS — R2689 Other abnormalities of gait and mobility: Secondary | ICD-10-CM | POA: Diagnosis not present

## 2016-03-23 DIAGNOSIS — M48061 Spinal stenosis, lumbar region without neurogenic claudication: Secondary | ICD-10-CM | POA: Diagnosis not present

## 2016-03-23 DIAGNOSIS — M1990 Unspecified osteoarthritis, unspecified site: Secondary | ICD-10-CM | POA: Diagnosis not present

## 2016-03-23 DIAGNOSIS — I2510 Atherosclerotic heart disease of native coronary artery without angina pectoris: Secondary | ICD-10-CM | POA: Diagnosis not present

## 2016-03-23 DIAGNOSIS — I10 Essential (primary) hypertension: Secondary | ICD-10-CM | POA: Diagnosis not present

## 2016-03-23 DIAGNOSIS — F419 Anxiety disorder, unspecified: Secondary | ICD-10-CM | POA: Diagnosis not present

## 2016-03-23 DIAGNOSIS — Z8781 Personal history of (healed) traumatic fracture: Secondary | ICD-10-CM | POA: Diagnosis not present

## 2016-03-23 DIAGNOSIS — F329 Major depressive disorder, single episode, unspecified: Secondary | ICD-10-CM | POA: Diagnosis not present

## 2016-03-23 NOTE — Telephone Encounter (Signed)
Monique Singletary PT [phone]) called today for verbal orders for PT consistating of strength training, transfers, and gait stability for 2xwk for 6wks, called back and orders given

## 2016-03-24 DIAGNOSIS — F419 Anxiety disorder, unspecified: Secondary | ICD-10-CM | POA: Diagnosis not present

## 2016-03-24 DIAGNOSIS — F339 Major depressive disorder, recurrent, unspecified: Secondary | ICD-10-CM | POA: Diagnosis not present

## 2016-03-28 DIAGNOSIS — R269 Unspecified abnormalities of gait and mobility: Secondary | ICD-10-CM | POA: Diagnosis not present

## 2016-03-28 DIAGNOSIS — E785 Hyperlipidemia, unspecified: Secondary | ICD-10-CM | POA: Diagnosis not present

## 2016-03-28 DIAGNOSIS — M1990 Unspecified osteoarthritis, unspecified site: Secondary | ICD-10-CM | POA: Diagnosis not present

## 2016-03-28 DIAGNOSIS — N3281 Overactive bladder: Secondary | ICD-10-CM | POA: Diagnosis not present

## 2016-03-28 DIAGNOSIS — M810 Age-related osteoporosis without current pathological fracture: Secondary | ICD-10-CM | POA: Diagnosis not present

## 2016-03-28 DIAGNOSIS — I2510 Atherosclerotic heart disease of native coronary artery without angina pectoris: Secondary | ICD-10-CM | POA: Diagnosis not present

## 2016-03-28 DIAGNOSIS — H04129 Dry eye syndrome of unspecified lacrimal gland: Secondary | ICD-10-CM | POA: Diagnosis not present

## 2016-03-28 DIAGNOSIS — L853 Xerosis cutis: Secondary | ICD-10-CM | POA: Diagnosis not present

## 2016-03-28 DIAGNOSIS — R600 Localized edema: Secondary | ICD-10-CM | POA: Diagnosis not present

## 2016-03-28 DIAGNOSIS — B351 Tinea unguium: Secondary | ICD-10-CM | POA: Diagnosis not present

## 2016-03-28 DIAGNOSIS — M2010 Hallux valgus (acquired), unspecified foot: Secondary | ICD-10-CM | POA: Diagnosis not present

## 2016-03-28 DIAGNOSIS — Q845 Enlarged and hypertrophic nails: Secondary | ICD-10-CM | POA: Diagnosis not present

## 2016-03-28 DIAGNOSIS — G629 Polyneuropathy, unspecified: Secondary | ICD-10-CM | POA: Diagnosis not present

## 2016-03-28 DIAGNOSIS — L603 Nail dystrophy: Secondary | ICD-10-CM | POA: Diagnosis not present

## 2016-03-29 DIAGNOSIS — F411 Generalized anxiety disorder: Secondary | ICD-10-CM | POA: Diagnosis not present

## 2016-03-30 DIAGNOSIS — I10 Essential (primary) hypertension: Secondary | ICD-10-CM | POA: Diagnosis not present

## 2016-03-30 DIAGNOSIS — M1990 Unspecified osteoarthritis, unspecified site: Secondary | ICD-10-CM | POA: Diagnosis not present

## 2016-03-30 DIAGNOSIS — F419 Anxiety disorder, unspecified: Secondary | ICD-10-CM | POA: Diagnosis not present

## 2016-03-30 DIAGNOSIS — F329 Major depressive disorder, single episode, unspecified: Secondary | ICD-10-CM | POA: Diagnosis not present

## 2016-03-30 DIAGNOSIS — M48061 Spinal stenosis, lumbar region without neurogenic claudication: Secondary | ICD-10-CM | POA: Diagnosis not present

## 2016-03-30 DIAGNOSIS — R2689 Other abnormalities of gait and mobility: Secondary | ICD-10-CM | POA: Diagnosis not present

## 2016-03-31 ENCOUNTER — Ambulatory Visit (HOSPITAL_COMMUNITY): Admission: RE | Admit: 2016-03-31 | Payer: Medicare Other | Source: Ambulatory Visit

## 2016-04-01 DIAGNOSIS — I10 Essential (primary) hypertension: Secondary | ICD-10-CM | POA: Diagnosis not present

## 2016-04-01 DIAGNOSIS — R2689 Other abnormalities of gait and mobility: Secondary | ICD-10-CM | POA: Diagnosis not present

## 2016-04-01 DIAGNOSIS — M1990 Unspecified osteoarthritis, unspecified site: Secondary | ICD-10-CM | POA: Diagnosis not present

## 2016-04-01 DIAGNOSIS — N3000 Acute cystitis without hematuria: Secondary | ICD-10-CM | POA: Diagnosis not present

## 2016-04-01 DIAGNOSIS — F329 Major depressive disorder, single episode, unspecified: Secondary | ICD-10-CM | POA: Diagnosis not present

## 2016-04-01 DIAGNOSIS — M48061 Spinal stenosis, lumbar region without neurogenic claudication: Secondary | ICD-10-CM | POA: Diagnosis not present

## 2016-04-01 DIAGNOSIS — F419 Anxiety disorder, unspecified: Secondary | ICD-10-CM | POA: Diagnosis not present

## 2016-04-06 DIAGNOSIS — F419 Anxiety disorder, unspecified: Secondary | ICD-10-CM | POA: Diagnosis not present

## 2016-04-06 DIAGNOSIS — F329 Major depressive disorder, single episode, unspecified: Secondary | ICD-10-CM | POA: Diagnosis not present

## 2016-04-06 DIAGNOSIS — R2689 Other abnormalities of gait and mobility: Secondary | ICD-10-CM | POA: Diagnosis not present

## 2016-04-06 DIAGNOSIS — M1990 Unspecified osteoarthritis, unspecified site: Secondary | ICD-10-CM | POA: Diagnosis not present

## 2016-04-06 DIAGNOSIS — I10 Essential (primary) hypertension: Secondary | ICD-10-CM | POA: Diagnosis not present

## 2016-04-06 DIAGNOSIS — M48061 Spinal stenosis, lumbar region without neurogenic claudication: Secondary | ICD-10-CM | POA: Diagnosis not present

## 2016-04-09 DIAGNOSIS — F419 Anxiety disorder, unspecified: Secondary | ICD-10-CM | POA: Diagnosis not present

## 2016-04-09 DIAGNOSIS — F329 Major depressive disorder, single episode, unspecified: Secondary | ICD-10-CM | POA: Diagnosis not present

## 2016-04-09 DIAGNOSIS — I10 Essential (primary) hypertension: Secondary | ICD-10-CM | POA: Diagnosis not present

## 2016-04-09 DIAGNOSIS — R2689 Other abnormalities of gait and mobility: Secondary | ICD-10-CM | POA: Diagnosis not present

## 2016-04-09 DIAGNOSIS — M1990 Unspecified osteoarthritis, unspecified site: Secondary | ICD-10-CM | POA: Diagnosis not present

## 2016-04-09 DIAGNOSIS — M48061 Spinal stenosis, lumbar region without neurogenic claudication: Secondary | ICD-10-CM | POA: Diagnosis not present

## 2016-04-12 DIAGNOSIS — F419 Anxiety disorder, unspecified: Secondary | ICD-10-CM | POA: Diagnosis not present

## 2016-04-12 DIAGNOSIS — M1990 Unspecified osteoarthritis, unspecified site: Secondary | ICD-10-CM | POA: Diagnosis not present

## 2016-04-12 DIAGNOSIS — F329 Major depressive disorder, single episode, unspecified: Secondary | ICD-10-CM | POA: Diagnosis not present

## 2016-04-12 DIAGNOSIS — R2689 Other abnormalities of gait and mobility: Secondary | ICD-10-CM | POA: Diagnosis not present

## 2016-04-12 DIAGNOSIS — I10 Essential (primary) hypertension: Secondary | ICD-10-CM | POA: Diagnosis not present

## 2016-04-12 DIAGNOSIS — M48061 Spinal stenosis, lumbar region without neurogenic claudication: Secondary | ICD-10-CM | POA: Diagnosis not present

## 2016-04-14 DIAGNOSIS — F419 Anxiety disorder, unspecified: Secondary | ICD-10-CM | POA: Diagnosis not present

## 2016-04-14 DIAGNOSIS — F329 Major depressive disorder, single episode, unspecified: Secondary | ICD-10-CM | POA: Diagnosis not present

## 2016-04-14 DIAGNOSIS — R2689 Other abnormalities of gait and mobility: Secondary | ICD-10-CM | POA: Diagnosis not present

## 2016-04-14 DIAGNOSIS — M1990 Unspecified osteoarthritis, unspecified site: Secondary | ICD-10-CM | POA: Diagnosis not present

## 2016-04-14 DIAGNOSIS — M48061 Spinal stenosis, lumbar region without neurogenic claudication: Secondary | ICD-10-CM | POA: Diagnosis not present

## 2016-04-14 DIAGNOSIS — I10 Essential (primary) hypertension: Secondary | ICD-10-CM | POA: Diagnosis not present

## 2016-04-19 ENCOUNTER — Ambulatory Visit: Payer: Medicare Other | Admitting: Physical Medicine & Rehabilitation

## 2016-04-19 DIAGNOSIS — F419 Anxiety disorder, unspecified: Secondary | ICD-10-CM | POA: Diagnosis not present

## 2016-04-19 DIAGNOSIS — F329 Major depressive disorder, single episode, unspecified: Secondary | ICD-10-CM | POA: Diagnosis not present

## 2016-04-19 DIAGNOSIS — R410 Disorientation, unspecified: Secondary | ICD-10-CM | POA: Diagnosis not present

## 2016-04-19 DIAGNOSIS — I10 Essential (primary) hypertension: Secondary | ICD-10-CM | POA: Diagnosis not present

## 2016-04-19 DIAGNOSIS — F411 Generalized anxiety disorder: Secondary | ICD-10-CM | POA: Diagnosis not present

## 2016-04-19 DIAGNOSIS — M1990 Unspecified osteoarthritis, unspecified site: Secondary | ICD-10-CM | POA: Diagnosis not present

## 2016-04-19 DIAGNOSIS — R2689 Other abnormalities of gait and mobility: Secondary | ICD-10-CM | POA: Diagnosis not present

## 2016-04-19 DIAGNOSIS — M48061 Spinal stenosis, lumbar region without neurogenic claudication: Secondary | ICD-10-CM | POA: Diagnosis not present

## 2016-04-21 DIAGNOSIS — H353221 Exudative age-related macular degeneration, left eye, with active choroidal neovascularization: Secondary | ICD-10-CM | POA: Diagnosis not present

## 2016-04-22 DIAGNOSIS — F419 Anxiety disorder, unspecified: Secondary | ICD-10-CM | POA: Diagnosis not present

## 2016-04-22 DIAGNOSIS — M1990 Unspecified osteoarthritis, unspecified site: Secondary | ICD-10-CM | POA: Diagnosis not present

## 2016-04-22 DIAGNOSIS — R2689 Other abnormalities of gait and mobility: Secondary | ICD-10-CM | POA: Diagnosis not present

## 2016-04-22 DIAGNOSIS — M48061 Spinal stenosis, lumbar region without neurogenic claudication: Secondary | ICD-10-CM | POA: Diagnosis not present

## 2016-04-22 DIAGNOSIS — I10 Essential (primary) hypertension: Secondary | ICD-10-CM | POA: Diagnosis not present

## 2016-04-22 DIAGNOSIS — F329 Major depressive disorder, single episode, unspecified: Secondary | ICD-10-CM | POA: Diagnosis not present

## 2016-04-26 DIAGNOSIS — F411 Generalized anxiety disorder: Secondary | ICD-10-CM | POA: Diagnosis not present

## 2016-04-26 DIAGNOSIS — F329 Major depressive disorder, single episode, unspecified: Secondary | ICD-10-CM | POA: Diagnosis not present

## 2016-04-26 DIAGNOSIS — I10 Essential (primary) hypertension: Secondary | ICD-10-CM | POA: Diagnosis not present

## 2016-04-26 DIAGNOSIS — F419 Anxiety disorder, unspecified: Secondary | ICD-10-CM | POA: Diagnosis not present

## 2016-04-26 DIAGNOSIS — M1990 Unspecified osteoarthritis, unspecified site: Secondary | ICD-10-CM | POA: Diagnosis not present

## 2016-04-26 DIAGNOSIS — R2689 Other abnormalities of gait and mobility: Secondary | ICD-10-CM | POA: Diagnosis not present

## 2016-04-26 DIAGNOSIS — M48061 Spinal stenosis, lumbar region without neurogenic claudication: Secondary | ICD-10-CM | POA: Diagnosis not present

## 2016-04-28 DIAGNOSIS — M810 Age-related osteoporosis without current pathological fracture: Secondary | ICD-10-CM | POA: Diagnosis not present

## 2016-04-28 DIAGNOSIS — R262 Difficulty in walking, not elsewhere classified: Secondary | ICD-10-CM | POA: Diagnosis not present

## 2016-04-28 DIAGNOSIS — R279 Unspecified lack of coordination: Secondary | ICD-10-CM | POA: Diagnosis not present

## 2016-04-28 DIAGNOSIS — M1991 Primary osteoarthritis, unspecified site: Secondary | ICD-10-CM | POA: Diagnosis not present

## 2016-04-29 DIAGNOSIS — M1990 Unspecified osteoarthritis, unspecified site: Secondary | ICD-10-CM | POA: Diagnosis not present

## 2016-04-29 DIAGNOSIS — E785 Hyperlipidemia, unspecified: Secondary | ICD-10-CM | POA: Diagnosis not present

## 2016-04-29 DIAGNOSIS — R269 Unspecified abnormalities of gait and mobility: Secondary | ICD-10-CM | POA: Diagnosis not present

## 2016-04-29 DIAGNOSIS — M810 Age-related osteoporosis without current pathological fracture: Secondary | ICD-10-CM | POA: Diagnosis not present

## 2016-04-29 DIAGNOSIS — I2510 Atherosclerotic heart disease of native coronary artery without angina pectoris: Secondary | ICD-10-CM | POA: Diagnosis not present

## 2016-04-29 DIAGNOSIS — G629 Polyneuropathy, unspecified: Secondary | ICD-10-CM | POA: Diagnosis not present

## 2016-04-29 DIAGNOSIS — N3281 Overactive bladder: Secondary | ICD-10-CM | POA: Diagnosis not present

## 2016-04-29 DIAGNOSIS — H04129 Dry eye syndrome of unspecified lacrimal gland: Secondary | ICD-10-CM | POA: Diagnosis not present

## 2016-04-29 DIAGNOSIS — R2689 Other abnormalities of gait and mobility: Secondary | ICD-10-CM | POA: Diagnosis not present

## 2016-04-29 DIAGNOSIS — M48061 Spinal stenosis, lumbar region without neurogenic claudication: Secondary | ICD-10-CM | POA: Diagnosis not present

## 2016-04-29 DIAGNOSIS — F419 Anxiety disorder, unspecified: Secondary | ICD-10-CM | POA: Diagnosis not present

## 2016-04-29 DIAGNOSIS — F329 Major depressive disorder, single episode, unspecified: Secondary | ICD-10-CM | POA: Diagnosis not present

## 2016-04-29 DIAGNOSIS — I10 Essential (primary) hypertension: Secondary | ICD-10-CM | POA: Diagnosis not present

## 2016-05-03 DIAGNOSIS — F419 Anxiety disorder, unspecified: Secondary | ICD-10-CM | POA: Diagnosis not present

## 2016-05-03 DIAGNOSIS — R2689 Other abnormalities of gait and mobility: Secondary | ICD-10-CM | POA: Diagnosis not present

## 2016-05-03 DIAGNOSIS — M48061 Spinal stenosis, lumbar region without neurogenic claudication: Secondary | ICD-10-CM | POA: Diagnosis not present

## 2016-05-03 DIAGNOSIS — F329 Major depressive disorder, single episode, unspecified: Secondary | ICD-10-CM | POA: Diagnosis not present

## 2016-05-03 DIAGNOSIS — M1990 Unspecified osteoarthritis, unspecified site: Secondary | ICD-10-CM | POA: Diagnosis not present

## 2016-05-03 DIAGNOSIS — I10 Essential (primary) hypertension: Secondary | ICD-10-CM | POA: Diagnosis not present

## 2016-05-05 DIAGNOSIS — R4189 Other symptoms and signs involving cognitive functions and awareness: Secondary | ICD-10-CM | POA: Diagnosis not present

## 2016-05-05 DIAGNOSIS — M810 Age-related osteoporosis without current pathological fracture: Secondary | ICD-10-CM | POA: Diagnosis not present

## 2016-05-05 DIAGNOSIS — Z79899 Other long term (current) drug therapy: Secondary | ICD-10-CM | POA: Diagnosis not present

## 2016-05-05 DIAGNOSIS — F22 Delusional disorders: Secondary | ICD-10-CM | POA: Diagnosis not present

## 2016-05-06 DIAGNOSIS — I10 Essential (primary) hypertension: Secondary | ICD-10-CM | POA: Diagnosis not present

## 2016-05-06 DIAGNOSIS — R2689 Other abnormalities of gait and mobility: Secondary | ICD-10-CM | POA: Diagnosis not present

## 2016-05-06 DIAGNOSIS — F329 Major depressive disorder, single episode, unspecified: Secondary | ICD-10-CM | POA: Diagnosis not present

## 2016-05-06 DIAGNOSIS — M48061 Spinal stenosis, lumbar region without neurogenic claudication: Secondary | ICD-10-CM | POA: Diagnosis not present

## 2016-05-06 DIAGNOSIS — F419 Anxiety disorder, unspecified: Secondary | ICD-10-CM | POA: Diagnosis not present

## 2016-05-06 DIAGNOSIS — M1990 Unspecified osteoarthritis, unspecified site: Secondary | ICD-10-CM | POA: Diagnosis not present

## 2016-05-09 ENCOUNTER — Ambulatory Visit: Payer: Medicare Other | Admitting: Physical Medicine & Rehabilitation

## 2016-05-10 ENCOUNTER — Encounter: Payer: Medicare Other | Attending: Physical Medicine & Rehabilitation

## 2016-05-10 ENCOUNTER — Ambulatory Visit (HOSPITAL_BASED_OUTPATIENT_CLINIC_OR_DEPARTMENT_OTHER): Payer: Medicare Other | Admitting: Physical Medicine & Rehabilitation

## 2016-05-10 ENCOUNTER — Encounter: Payer: Self-pay | Admitting: Physical Medicine & Rehabilitation

## 2016-05-10 VITALS — BP 93/60 | HR 84 | Resp 14

## 2016-05-10 DIAGNOSIS — M2351 Chronic instability of knee, right knee: Secondary | ICD-10-CM

## 2016-05-10 DIAGNOSIS — R2681 Unsteadiness on feet: Secondary | ICD-10-CM

## 2016-05-10 DIAGNOSIS — M48061 Spinal stenosis, lumbar region without neurogenic claudication: Secondary | ICD-10-CM | POA: Insufficient documentation

## 2016-05-10 DIAGNOSIS — G8929 Other chronic pain: Secondary | ICD-10-CM | POA: Diagnosis not present

## 2016-05-10 DIAGNOSIS — M4316 Spondylolisthesis, lumbar region: Secondary | ICD-10-CM | POA: Insufficient documentation

## 2016-05-10 DIAGNOSIS — F329 Major depressive disorder, single episode, unspecified: Secondary | ICD-10-CM | POA: Diagnosis not present

## 2016-05-10 DIAGNOSIS — K589 Irritable bowel syndrome without diarrhea: Secondary | ICD-10-CM | POA: Insufficient documentation

## 2016-05-10 DIAGNOSIS — I10 Essential (primary) hypertension: Secondary | ICD-10-CM | POA: Diagnosis not present

## 2016-05-10 DIAGNOSIS — Z96651 Presence of right artificial knee joint: Secondary | ICD-10-CM | POA: Insufficient documentation

## 2016-05-10 DIAGNOSIS — M810 Age-related osteoporosis without current pathological fracture: Secondary | ICD-10-CM | POA: Insufficient documentation

## 2016-05-10 DIAGNOSIS — I2510 Atherosclerotic heart disease of native coronary artery without angina pectoris: Secondary | ICD-10-CM | POA: Diagnosis not present

## 2016-05-10 DIAGNOSIS — R2689 Other abnormalities of gait and mobility: Secondary | ICD-10-CM | POA: Diagnosis not present

## 2016-05-10 DIAGNOSIS — M069 Rheumatoid arthritis, unspecified: Secondary | ICD-10-CM | POA: Diagnosis not present

## 2016-05-10 DIAGNOSIS — M25511 Pain in right shoulder: Secondary | ICD-10-CM | POA: Diagnosis not present

## 2016-05-10 DIAGNOSIS — F419 Anxiety disorder, unspecified: Secondary | ICD-10-CM | POA: Insufficient documentation

## 2016-05-10 DIAGNOSIS — E785 Hyperlipidemia, unspecified: Secondary | ICD-10-CM | POA: Insufficient documentation

## 2016-05-10 DIAGNOSIS — M25512 Pain in left shoulder: Secondary | ICD-10-CM | POA: Insufficient documentation

## 2016-05-10 DIAGNOSIS — M25561 Pain in right knee: Secondary | ICD-10-CM | POA: Diagnosis not present

## 2016-05-10 DIAGNOSIS — M1990 Unspecified osteoarthritis, unspecified site: Secondary | ICD-10-CM | POA: Diagnosis not present

## 2016-05-10 NOTE — Patient Instructions (Signed)
Try Dr Felicie Morn gel insert Left shoe
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
## 2016-05-12 DIAGNOSIS — M48061 Spinal stenosis, lumbar region without neurogenic claudication: Secondary | ICD-10-CM | POA: Diagnosis not present

## 2016-05-12 DIAGNOSIS — M1990 Unspecified osteoarthritis, unspecified site: Secondary | ICD-10-CM | POA: Diagnosis not present

## 2016-05-12 DIAGNOSIS — F419 Anxiety disorder, unspecified: Secondary | ICD-10-CM | POA: Diagnosis not present

## 2016-05-12 DIAGNOSIS — I10 Essential (primary) hypertension: Secondary | ICD-10-CM | POA: Diagnosis not present

## 2016-05-12 DIAGNOSIS — R2689 Other abnormalities of gait and mobility: Secondary | ICD-10-CM | POA: Diagnosis not present

## 2016-05-12 DIAGNOSIS — F329 Major depressive disorder, single episode, unspecified: Secondary | ICD-10-CM | POA: Diagnosis not present

## 2016-05-18 DIAGNOSIS — M48061 Spinal stenosis, lumbar region without neurogenic claudication: Secondary | ICD-10-CM | POA: Diagnosis not present

## 2016-05-18 DIAGNOSIS — M150 Primary generalized (osteo)arthritis: Secondary | ICD-10-CM | POA: Diagnosis not present

## 2016-05-18 DIAGNOSIS — M0600 Rheumatoid arthritis without rheumatoid factor, unspecified site: Secondary | ICD-10-CM | POA: Diagnosis not present

## 2016-05-18 DIAGNOSIS — G894 Chronic pain syndrome: Secondary | ICD-10-CM | POA: Diagnosis not present

## 2016-05-19 DIAGNOSIS — G894 Chronic pain syndrome: Secondary | ICD-10-CM | POA: Diagnosis not present

## 2016-05-19 DIAGNOSIS — M0600 Rheumatoid arthritis without rheumatoid factor, unspecified site: Secondary | ICD-10-CM | POA: Diagnosis not present

## 2016-05-24 DIAGNOSIS — G894 Chronic pain syndrome: Secondary | ICD-10-CM | POA: Diagnosis not present

## 2016-05-24 DIAGNOSIS — M0600 Rheumatoid arthritis without rheumatoid factor, unspecified site: Secondary | ICD-10-CM | POA: Diagnosis not present

## 2016-05-25 ENCOUNTER — Ambulatory Visit (HOSPITAL_COMMUNITY): Payer: Medicare Other

## 2016-05-26 ENCOUNTER — Encounter (HOSPITAL_COMMUNITY): Payer: Self-pay

## 2016-05-26 ENCOUNTER — Ambulatory Visit (HOSPITAL_COMMUNITY)
Admission: RE | Admit: 2016-05-26 | Discharge: 2016-05-26 | Disposition: A | Discharge status: Home / Self Care | Payer: Medicare Other | Source: Ambulatory Visit | Attending: Rheumatology | Admitting: Rheumatology

## 2016-05-26 DIAGNOSIS — M810 Age-related osteoporosis without current pathological fracture: Secondary | ICD-10-CM | POA: Diagnosis not present

## 2016-05-26 DIAGNOSIS — G894 Chronic pain syndrome: Secondary | ICD-10-CM | POA: Diagnosis not present

## 2016-05-26 DIAGNOSIS — M0600 Rheumatoid arthritis without rheumatoid factor, unspecified site: Secondary | ICD-10-CM | POA: Diagnosis not present

## 2016-05-26 MED ORDER — ZOLEDRONIC ACID 5 MG/100ML IV SOLN
5.0000 mg | Freq: Once | INTRAVENOUS | Status: AC
  Administered 2016-05-26: 5 mg via INTRAVENOUS
  Filled 2016-05-26: qty 100

## 2016-05-26 MED ORDER — SODIUM CHLORIDE 0.9 % IV SOLN
Freq: Once | INTRAVENOUS | Status: AC
  Administered 2016-05-26: 12:00:00 via INTRAVENOUS

## 2016-05-26 NOTE — Discharge Instructions (Signed)
Call Md for any problems or questions.   Drink  Fluids/water as tolerated over the next 72 hours Tylenol if needed for aches and pains  Continue Calcium and Vit D as directed by your MD      Zoledronic Acid injection (Paget's Disease, Osteoporosis) What is this medicine? ZOLEDRONIC ACID (ZOE le dron ik AS id) lowers the amount of calcium loss from bone. It is used to treat Paget's disease and osteoporosis in women. This medicine may be used for other purposes; ask your health care provider or pharmacist if you have questions. COMMON BRAND NAME(S): Reclast, Zometa What should I tell my health care provider before I take this medicine? They need to know if you have any of these conditions: -aspirin-sensitive asthma -cancer, especially if you are receiving medicines used to treat cancer -dental disease or wear dentures -infection -kidney disease -low levels of calcium in the blood -past surgery on the parathyroid gland or intestines -receiving corticosteroids like dexamethasone or prednisone -an unusual or allergic reaction to zoledronic acid, other medicines, foods, dyes, or preservatives -pregnant or trying to get pregnant -breast-feeding How should I use this medicine? This medicine is for infusion into a vein. It is given by a health care professional in a hospital or clinic setting. Talk to your pediatrician regarding the use of this medicine in children. This medicine is not approved for use in children. Overdosage: If you think you have taken too much of this medicine contact a poison control center or emergency room at once. NOTE: This medicine is only for you. Do not share this medicine with others. What if I miss a dose? It is important not to miss your dose. Call your doctor or health care professional if you are unable to keep an appointment. What may interact with this medicine? -certain antibiotics given by injection -NSAIDs, medicines for pain and inflammation, like  ibuprofen or naproxen -some diuretics like bumetanide, furosemide -teriparatide This list may not describe all possible interactions. Give your health care provider a list of all the medicines, herbs, non-prescription drugs, or dietary supplements you use. Also tell them if you smoke, drink alcohol, or use illegal drugs. Some items may interact with your medicine. What should I watch for while using this medicine? Visit your doctor or health care professional for regular checkups. It may be some time before you see the benefit from this medicine. Do not stop taking your medicine unless your doctor tells you to. Your doctor may order blood tests or other tests to see how you are doing. Women should inform their doctor if they wish to become pregnant or think they might be pregnant. There is a potential for serious side effects to an unborn child. Talk to your health care professional or pharmacist for more information. You should make sure that you get enough calcium and vitamin D while you are taking this medicine. Discuss the foods you eat and the vitamins you take with your health care professional. Some people who take this medicine have severe bone, joint, and/or muscle pain. This medicine may also increase your risk for jaw problems or a broken thigh bone. Tell your doctor right away if you have severe pain in your jaw, bones, joints, or muscles. Tell your doctor if you have any pain that does not go away or that gets worse. Tell your dentist and dental surgeon that you are taking this medicine. You should not have major dental surgery while on this medicine. See your dentist to have  a dental exam and fix any dental problems before starting this medicine. Take good care of your teeth while on this medicine. Make sure you see your dentist for regular follow-up appointments. What side effects may I notice from receiving this medicine? Side effects that you should report to your doctor or health care  professional as soon as possible: -allergic reactions like skin rash, itching or hives, swelling of the face, lips, or tongue -anxiety, confusion, or depression -breathing problems -changes in vision -eye pain -feeling faint or lightheaded, falls -jaw pain, especially after dental work -mouth sores -muscle cramps, stiffness, or weakness -redness, blistering, peeling or loosening of the skin, including inside the mouth -trouble passing urine or change in the amount of urine Side effects that usually do not require medical attention (report to your doctor or health care professional if they continue or are bothersome): -bone, joint, or muscle pain -constipation -diarrhea -fever -hair loss -irritation at site where injected -loss of appetite -nausea, vomiting -stomach upset -trouble sleeping -trouble swallowing -weak or tired This list may not describe all possible side effects. Call your doctor for medical advice about side effects. You may report side effects to FDA at 1-800-FDA-1088. Where should I keep my medicine? This drug is given in a hospital or clinic and will not be stored at home. NOTE: This sheet is a summary. It may not cover all possible information. If you have questions about this medicine, talk to your doctor, pharmacist, or health care provider.  2018 Elsevier/Gold Standard (2013-06-15 14:19:57)

## 2016-05-27 DIAGNOSIS — G629 Polyneuropathy, unspecified: Secondary | ICD-10-CM | POA: Diagnosis not present

## 2016-05-27 DIAGNOSIS — E785 Hyperlipidemia, unspecified: Secondary | ICD-10-CM | POA: Diagnosis not present

## 2016-05-27 DIAGNOSIS — M810 Age-related osteoporosis without current pathological fracture: Secondary | ICD-10-CM | POA: Diagnosis not present

## 2016-05-27 DIAGNOSIS — R269 Unspecified abnormalities of gait and mobility: Secondary | ICD-10-CM | POA: Diagnosis not present

## 2016-05-27 DIAGNOSIS — I2510 Atherosclerotic heart disease of native coronary artery without angina pectoris: Secondary | ICD-10-CM | POA: Diagnosis not present

## 2016-05-27 DIAGNOSIS — H04129 Dry eye syndrome of unspecified lacrimal gland: Secondary | ICD-10-CM | POA: Diagnosis not present

## 2016-05-27 DIAGNOSIS — M1990 Unspecified osteoarthritis, unspecified site: Secondary | ICD-10-CM | POA: Diagnosis not present

## 2016-05-27 DIAGNOSIS — N3281 Overactive bladder: Secondary | ICD-10-CM | POA: Diagnosis not present

## 2016-05-30 DIAGNOSIS — K580 Irritable bowel syndrome with diarrhea: Secondary | ICD-10-CM | POA: Diagnosis not present

## 2016-05-30 DIAGNOSIS — R413 Other amnesia: Secondary | ICD-10-CM | POA: Diagnosis not present

## 2016-05-30 DIAGNOSIS — M5136 Other intervertebral disc degeneration, lumbar region: Secondary | ICD-10-CM | POA: Diagnosis not present

## 2016-05-30 DIAGNOSIS — F324 Major depressive disorder, single episode, in partial remission: Secondary | ICD-10-CM | POA: Diagnosis not present

## 2016-05-30 DIAGNOSIS — G894 Chronic pain syndrome: Secondary | ICD-10-CM | POA: Diagnosis not present

## 2016-05-30 DIAGNOSIS — M179 Osteoarthritis of knee, unspecified: Secondary | ICD-10-CM | POA: Diagnosis not present

## 2016-05-30 DIAGNOSIS — R682 Dry mouth, unspecified: Secondary | ICD-10-CM | POA: Diagnosis not present

## 2016-05-30 DIAGNOSIS — R197 Diarrhea, unspecified: Secondary | ICD-10-CM | POA: Diagnosis not present

## 2016-05-30 DIAGNOSIS — M0609 Rheumatoid arthritis without rheumatoid factor, multiple sites: Secondary | ICD-10-CM | POA: Diagnosis not present

## 2016-05-30 DIAGNOSIS — N390 Urinary tract infection, site not specified: Secondary | ICD-10-CM | POA: Diagnosis not present

## 2016-05-31 DIAGNOSIS — G894 Chronic pain syndrome: Secondary | ICD-10-CM | POA: Diagnosis not present

## 2016-05-31 DIAGNOSIS — F0391 Unspecified dementia with behavioral disturbance: Secondary | ICD-10-CM | POA: Diagnosis not present

## 2016-05-31 DIAGNOSIS — F339 Major depressive disorder, recurrent, unspecified: Secondary | ICD-10-CM | POA: Diagnosis not present

## 2016-05-31 DIAGNOSIS — F419 Anxiety disorder, unspecified: Secondary | ICD-10-CM | POA: Diagnosis not present

## 2016-05-31 DIAGNOSIS — F0634 Mood disorder due to known physiological condition with mixed features: Secondary | ICD-10-CM | POA: Diagnosis not present

## 2016-05-31 DIAGNOSIS — M0600 Rheumatoid arthritis without rheumatoid factor, unspecified site: Secondary | ICD-10-CM | POA: Diagnosis not present

## 2016-06-01 DIAGNOSIS — R197 Diarrhea, unspecified: Secondary | ICD-10-CM | POA: Diagnosis not present

## 2016-06-02 DIAGNOSIS — G894 Chronic pain syndrome: Secondary | ICD-10-CM | POA: Diagnosis not present

## 2016-06-02 DIAGNOSIS — M0600 Rheumatoid arthritis without rheumatoid factor, unspecified site: Secondary | ICD-10-CM | POA: Diagnosis not present

## 2016-06-07 DIAGNOSIS — G894 Chronic pain syndrome: Secondary | ICD-10-CM | POA: Diagnosis not present

## 2016-06-07 DIAGNOSIS — M0600 Rheumatoid arthritis without rheumatoid factor, unspecified site: Secondary | ICD-10-CM | POA: Diagnosis not present

## 2016-06-08 DIAGNOSIS — M0600 Rheumatoid arthritis without rheumatoid factor, unspecified site: Secondary | ICD-10-CM | POA: Diagnosis not present

## 2016-06-08 DIAGNOSIS — G894 Chronic pain syndrome: Secondary | ICD-10-CM | POA: Diagnosis not present

## 2016-06-09 DIAGNOSIS — M0600 Rheumatoid arthritis without rheumatoid factor, unspecified site: Secondary | ICD-10-CM | POA: Diagnosis not present

## 2016-06-09 DIAGNOSIS — H353221 Exudative age-related macular degeneration, left eye, with active choroidal neovascularization: Secondary | ICD-10-CM | POA: Diagnosis not present

## 2016-06-09 DIAGNOSIS — G894 Chronic pain syndrome: Secondary | ICD-10-CM | POA: Diagnosis not present

## 2016-06-10 DIAGNOSIS — G894 Chronic pain syndrome: Secondary | ICD-10-CM | POA: Diagnosis not present

## 2016-06-10 DIAGNOSIS — M0600 Rheumatoid arthritis without rheumatoid factor, unspecified site: Secondary | ICD-10-CM | POA: Diagnosis not present

## 2016-06-13 DIAGNOSIS — G894 Chronic pain syndrome: Secondary | ICD-10-CM | POA: Diagnosis not present

## 2016-06-13 DIAGNOSIS — M0600 Rheumatoid arthritis without rheumatoid factor, unspecified site: Secondary | ICD-10-CM | POA: Diagnosis not present

## 2016-06-14 DIAGNOSIS — G894 Chronic pain syndrome: Secondary | ICD-10-CM | POA: Diagnosis not present

## 2016-06-14 DIAGNOSIS — M0600 Rheumatoid arthritis without rheumatoid factor, unspecified site: Secondary | ICD-10-CM | POA: Diagnosis not present

## 2016-06-16 DIAGNOSIS — M0600 Rheumatoid arthritis without rheumatoid factor, unspecified site: Secondary | ICD-10-CM | POA: Diagnosis not present

## 2016-06-16 DIAGNOSIS — G894 Chronic pain syndrome: Secondary | ICD-10-CM | POA: Diagnosis not present

## 2016-06-21 DIAGNOSIS — G894 Chronic pain syndrome: Secondary | ICD-10-CM | POA: Diagnosis not present

## 2016-06-21 DIAGNOSIS — M0600 Rheumatoid arthritis without rheumatoid factor, unspecified site: Secondary | ICD-10-CM | POA: Diagnosis not present

## 2016-06-22 DIAGNOSIS — M0600 Rheumatoid arthritis without rheumatoid factor, unspecified site: Secondary | ICD-10-CM | POA: Diagnosis not present

## 2016-06-22 DIAGNOSIS — G894 Chronic pain syndrome: Secondary | ICD-10-CM | POA: Diagnosis not present

## 2016-06-23 DIAGNOSIS — G894 Chronic pain syndrome: Secondary | ICD-10-CM | POA: Diagnosis not present

## 2016-06-23 DIAGNOSIS — M0600 Rheumatoid arthritis without rheumatoid factor, unspecified site: Secondary | ICD-10-CM | POA: Diagnosis not present

## 2016-06-28 DIAGNOSIS — M0600 Rheumatoid arthritis without rheumatoid factor, unspecified site: Secondary | ICD-10-CM | POA: Diagnosis not present

## 2016-06-28 DIAGNOSIS — G894 Chronic pain syndrome: Secondary | ICD-10-CM | POA: Diagnosis not present

## 2016-07-01 DIAGNOSIS — G894 Chronic pain syndrome: Secondary | ICD-10-CM | POA: Diagnosis not present

## 2016-07-01 DIAGNOSIS — M0600 Rheumatoid arthritis without rheumatoid factor, unspecified site: Secondary | ICD-10-CM | POA: Diagnosis not present

## 2016-07-08 DIAGNOSIS — F0391 Unspecified dementia with behavioral disturbance: Secondary | ICD-10-CM | POA: Diagnosis not present

## 2016-07-08 DIAGNOSIS — F0634 Mood disorder due to known physiological condition with mixed features: Secondary | ICD-10-CM | POA: Diagnosis not present

## 2016-07-08 DIAGNOSIS — F419 Anxiety disorder, unspecified: Secondary | ICD-10-CM | POA: Diagnosis not present

## 2016-07-08 DIAGNOSIS — F339 Major depressive disorder, recurrent, unspecified: Secondary | ICD-10-CM | POA: Diagnosis not present

## 2016-07-11 ENCOUNTER — Emergency Department (HOSPITAL_COMMUNITY): Payer: Medicare Other

## 2016-07-11 ENCOUNTER — Emergency Department (HOSPITAL_COMMUNITY)
Admission: EM | Admit: 2016-07-11 | Discharge: 2016-07-11 | Disposition: A | Discharge status: Home / Self Care | Payer: Medicare Other | Attending: Emergency Medicine | Admitting: Emergency Medicine

## 2016-07-11 ENCOUNTER — Encounter (HOSPITAL_COMMUNITY): Payer: Self-pay | Admitting: Emergency Medicine

## 2016-07-11 DIAGNOSIS — I517 Cardiomegaly: Secondary | ICD-10-CM | POA: Diagnosis not present

## 2016-07-11 DIAGNOSIS — M25559 Pain in unspecified hip: Secondary | ICD-10-CM | POA: Diagnosis not present

## 2016-07-11 DIAGNOSIS — N3000 Acute cystitis without hematuria: Secondary | ICD-10-CM | POA: Diagnosis not present

## 2016-07-11 DIAGNOSIS — M79604 Pain in right leg: Secondary | ICD-10-CM | POA: Diagnosis not present

## 2016-07-11 DIAGNOSIS — M79605 Pain in left leg: Secondary | ICD-10-CM | POA: Insufficient documentation

## 2016-07-11 DIAGNOSIS — R531 Weakness: Secondary | ICD-10-CM | POA: Diagnosis not present

## 2016-07-11 DIAGNOSIS — Z79899 Other long term (current) drug therapy: Secondary | ICD-10-CM | POA: Insufficient documentation

## 2016-07-11 DIAGNOSIS — I2510 Atherosclerotic heart disease of native coronary artery without angina pectoris: Secondary | ICD-10-CM | POA: Insufficient documentation

## 2016-07-11 DIAGNOSIS — M79606 Pain in leg, unspecified: Secondary | ICD-10-CM | POA: Diagnosis not present

## 2016-07-11 DIAGNOSIS — I10 Essential (primary) hypertension: Secondary | ICD-10-CM | POA: Diagnosis not present

## 2016-07-11 DIAGNOSIS — R030 Elevated blood-pressure reading, without diagnosis of hypertension: Secondary | ICD-10-CM | POA: Diagnosis not present

## 2016-07-11 LAB — CBC WITH DIFFERENTIAL/PLATELET
BASOS PCT: 0 %
Basophils Absolute: 0 10*3/uL (ref 0.0–0.1)
EOS PCT: 2 %
Eosinophils Absolute: 0.1 10*3/uL (ref 0.0–0.7)
HCT: 35.1 % — ABNORMAL LOW (ref 36.0–46.0)
Hemoglobin: 11.3 g/dL — ABNORMAL LOW (ref 12.0–15.0)
LYMPHS ABS: 1.1 10*3/uL (ref 0.7–4.0)
Lymphocytes Relative: 14 %
MCH: 29.5 pg (ref 26.0–34.0)
MCHC: 32.2 g/dL (ref 30.0–36.0)
MCV: 91.6 fL (ref 78.0–100.0)
MONOS PCT: 13 %
Monocytes Absolute: 1 10*3/uL (ref 0.1–1.0)
NEUTROS PCT: 71 %
Neutro Abs: 5.6 10*3/uL (ref 1.7–7.7)
PLATELETS: 235 10*3/uL (ref 150–400)
RBC: 3.83 MIL/uL — ABNORMAL LOW (ref 3.87–5.11)
RDW: 15.1 % (ref 11.5–15.5)
WBC: 7.9 10*3/uL (ref 4.0–10.5)

## 2016-07-11 LAB — BASIC METABOLIC PANEL
Anion gap: 6 (ref 5–15)
BUN: 17 mg/dL (ref 6–20)
CALCIUM: 9 mg/dL (ref 8.9–10.3)
CHLORIDE: 103 mmol/L (ref 101–111)
CO2: 29 mmol/L (ref 22–32)
Creatinine, Ser: 0.58 mg/dL (ref 0.44–1.00)
GFR calc non Af Amer: >60 mL/min (ref >60)
Glucose, Bld: 115 mg/dL — ABNORMAL HIGH (ref 65–99)
Potassium: 4.2 mmol/L (ref 3.5–5.1)
SODIUM: 138 mmol/L (ref 135–145)

## 2016-07-11 LAB — I-STAT TROPONIN, ED: TROPONIN I, POC: 0.01 ng/mL (ref 0.00–0.08)

## 2016-07-11 LAB — URINALYSIS, ROUTINE W REFLEX MICROSCOPIC
Bilirubin Urine: NEGATIVE
GLUCOSE, UA: NEGATIVE mg/dL
Hgb urine dipstick: NEGATIVE
Ketones, ur: NEGATIVE mg/dL
Nitrite: POSITIVE — AB
PH: 6 (ref 5.0–8.0)
Protein, ur: NEGATIVE mg/dL
SPECIFIC GRAVITY, URINE: 1.02 (ref 1.005–1.030)

## 2016-07-11 MED ORDER — CEPHALEXIN 500 MG PO CAPS: 500.0000 mg | ORAL_CAPSULE | Freq: Four times a day (QID) | ORAL | 0 refills | Status: DC

## 2016-07-11 MED ORDER — HYDROCODONE-ACETAMINOPHEN 5-325 MG PO TABS: 1.0000 | ORAL_TABLET | Freq: Four times a day (QID) | ORAL | 0 refills | Status: DC | PRN

## 2016-07-11 MED ORDER — MORPHINE SULFATE (PF) 2 MG/ML IV SOLN
4.0000 mg | Freq: Once | INTRAVENOUS | Status: AC
  Administered 2016-07-11: 4 mg via INTRAVENOUS
  Filled 2016-07-11: qty 2

## 2016-07-11 MED ORDER — SODIUM CHLORIDE 0.9 % IV BOLUS (SEPSIS)
500.0000 mL | Freq: Once | INTRAVENOUS | Status: AC
  Administered 2016-07-11: 500 mL via INTRAVENOUS

## 2016-07-11 MED ORDER — DEXTROSE 5 % IV SOLN
1.0000 g | Freq: Once | INTRAVENOUS | Status: AC
  Administered 2016-07-11: 1 g via INTRAVENOUS
  Filled 2016-07-11: qty 10

## 2016-07-11 NOTE — ED Notes (Signed)
[facility] notified of need for Pt transportation 

## 2016-07-11 NOTE — ED Triage Notes (Signed)
Pt is from [facility] ([facility] [address] [place].  Staff advised that she has been having pain in bilateral legs for the last few days. Pt has chronic pain in her legs but it has worsened over the last few days.   Pt is ambulatory at baseline but has not been able to ambulate for the last few days and has been using a wheelchair.  Pts family wanted her evaluated.
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
## 2016-07-11 NOTE — Discharge Instructions (Signed)
Keflex as prescribed.  Hydrocodone as prescribed as needed for pain.  Follow-up with your primary Dr. if not improving in the next 3-4 days, and return to the ER if symptoms significantly worsen or change.

## 2016-07-14 DIAGNOSIS — G894 Chronic pain syndrome: Secondary | ICD-10-CM | POA: Diagnosis not present

## 2016-07-14 DIAGNOSIS — M0600 Rheumatoid arthritis without rheumatoid factor, unspecified site: Secondary | ICD-10-CM | POA: Diagnosis not present

## 2016-07-17 DIAGNOSIS — M0600 Rheumatoid arthritis without rheumatoid factor, unspecified site: Secondary | ICD-10-CM | POA: Diagnosis not present

## 2016-07-17 DIAGNOSIS — M150 Primary generalized (osteo)arthritis: Secondary | ICD-10-CM | POA: Diagnosis not present

## 2016-07-17 DIAGNOSIS — M48061 Spinal stenosis, lumbar region without neurogenic claudication: Secondary | ICD-10-CM | POA: Diagnosis not present

## 2016-07-17 DIAGNOSIS — G894 Chronic pain syndrome: Secondary | ICD-10-CM | POA: Diagnosis not present

## 2016-07-19 DIAGNOSIS — H353221 Exudative age-related macular degeneration, left eye, with active choroidal neovascularization: Secondary | ICD-10-CM | POA: Diagnosis not present

## 2016-07-20 DIAGNOSIS — M0600 Rheumatoid arthritis without rheumatoid factor, unspecified site: Secondary | ICD-10-CM | POA: Diagnosis not present

## 2016-07-20 DIAGNOSIS — G894 Chronic pain syndrome: Secondary | ICD-10-CM | POA: Diagnosis not present

## 2016-07-21 DIAGNOSIS — G894 Chronic pain syndrome: Secondary | ICD-10-CM | POA: Diagnosis not present

## 2016-07-21 DIAGNOSIS — M0600 Rheumatoid arthritis without rheumatoid factor, unspecified site: Secondary | ICD-10-CM | POA: Diagnosis not present

## 2016-07-24 ENCOUNTER — Encounter (HOSPITAL_COMMUNITY): Payer: Self-pay | Admitting: Nurse Practitioner

## 2016-07-24 ENCOUNTER — Emergency Department (HOSPITAL_COMMUNITY): Payer: Medicare Other

## 2016-07-24 ENCOUNTER — Inpatient Hospital Stay (HOSPITAL_COMMUNITY)
Admission: EM | Admit: 2016-07-24 | Discharge: 2016-07-28 | DRG: 193 | Disposition: A | Discharge status: Skilled Nursing Facility | Payer: Medicare Other | Attending: Family Medicine | Admitting: Family Medicine

## 2016-07-24 ENCOUNTER — Observation Stay (HOSPITAL_COMMUNITY): Payer: Medicare Other

## 2016-07-24 DIAGNOSIS — G9340 Encephalopathy, unspecified: Secondary | ICD-10-CM | POA: Diagnosis present

## 2016-07-24 DIAGNOSIS — Z9181 History of falling: Secondary | ICD-10-CM

## 2016-07-24 DIAGNOSIS — Z800 Family history of malignant neoplasm of digestive organs: Secondary | ICD-10-CM

## 2016-07-24 DIAGNOSIS — G8929 Other chronic pain: Secondary | ICD-10-CM | POA: Diagnosis present

## 2016-07-24 DIAGNOSIS — Z801 Family history of malignant neoplasm of trachea, bronchus and lung: Secondary | ICD-10-CM

## 2016-07-24 DIAGNOSIS — N390 Urinary tract infection, site not specified: Secondary | ICD-10-CM | POA: Diagnosis present

## 2016-07-24 DIAGNOSIS — Z885 Allergy status to narcotic agent status: Secondary | ICD-10-CM

## 2016-07-24 DIAGNOSIS — F418 Other specified anxiety disorders: Secondary | ICD-10-CM | POA: Diagnosis not present

## 2016-07-24 DIAGNOSIS — R531 Weakness: Secondary | ICD-10-CM | POA: Diagnosis not present

## 2016-07-24 DIAGNOSIS — Z8249 Family history of ischemic heart disease and other diseases of the circulatory system: Secondary | ICD-10-CM

## 2016-07-24 DIAGNOSIS — R4182 Altered mental status, unspecified: Secondary | ICD-10-CM | POA: Diagnosis not present

## 2016-07-24 DIAGNOSIS — J181 Lobar pneumonia, unspecified organism: Principal | ICD-10-CM | POA: Diagnosis present

## 2016-07-24 DIAGNOSIS — I10 Essential (primary) hypertension: Secondary | ICD-10-CM | POA: Diagnosis not present

## 2016-07-24 DIAGNOSIS — G92 Toxic encephalopathy: Secondary | ICD-10-CM | POA: Diagnosis present

## 2016-07-24 DIAGNOSIS — E440 Moderate protein-calorie malnutrition: Secondary | ICD-10-CM | POA: Diagnosis present

## 2016-07-24 DIAGNOSIS — I2510 Atherosclerotic heart disease of native coronary artery without angina pectoris: Secondary | ICD-10-CM | POA: Diagnosis present

## 2016-07-24 DIAGNOSIS — Y95 Nosocomial condition: Secondary | ICD-10-CM | POA: Diagnosis present

## 2016-07-24 DIAGNOSIS — M069 Rheumatoid arthritis, unspecified: Secondary | ICD-10-CM | POA: Diagnosis present

## 2016-07-24 DIAGNOSIS — Z96653 Presence of artificial knee joint, bilateral: Secondary | ICD-10-CM | POA: Diagnosis present

## 2016-07-24 DIAGNOSIS — Z89422 Acquired absence of other left toe(s): Secondary | ICD-10-CM

## 2016-07-24 DIAGNOSIS — Z90710 Acquired absence of both cervix and uterus: Secondary | ICD-10-CM

## 2016-07-24 DIAGNOSIS — M79605 Pain in left leg: Secondary | ICD-10-CM | POA: Diagnosis not present

## 2016-07-24 DIAGNOSIS — M79606 Pain in leg, unspecified: Secondary | ICD-10-CM

## 2016-07-24 DIAGNOSIS — D649 Anemia, unspecified: Secondary | ICD-10-CM | POA: Diagnosis present

## 2016-07-24 DIAGNOSIS — J189 Pneumonia, unspecified organism: Secondary | ICD-10-CM | POA: Diagnosis present

## 2016-07-24 DIAGNOSIS — M48062 Spinal stenosis, lumbar region with neurogenic claudication: Secondary | ICD-10-CM | POA: Diagnosis present

## 2016-07-24 DIAGNOSIS — R410 Disorientation, unspecified: Secondary | ICD-10-CM | POA: Diagnosis not present

## 2016-07-24 DIAGNOSIS — I350 Nonrheumatic aortic (valve) stenosis: Secondary | ICD-10-CM | POA: Diagnosis present

## 2016-07-24 DIAGNOSIS — Z974 Presence of external hearing-aid: Secondary | ICD-10-CM

## 2016-07-24 DIAGNOSIS — M810 Age-related osteoporosis without current pathological fracture: Secondary | ICD-10-CM | POA: Diagnosis present

## 2016-07-24 DIAGNOSIS — R269 Unspecified abnormalities of gait and mobility: Secondary | ICD-10-CM

## 2016-07-24 DIAGNOSIS — Z888 Allergy status to other drugs, medicaments and biological substances status: Secondary | ICD-10-CM

## 2016-07-24 DIAGNOSIS — Z8672 Personal history of thrombophlebitis: Secondary | ICD-10-CM

## 2016-07-24 LAB — CBC WITH DIFFERENTIAL/PLATELET
BASOS ABS: 0 10*3/uL (ref 0.0–0.1)
Basophils Relative: 0 %
EOS ABS: 0.1 10*3/uL (ref 0.0–0.7)
EOS PCT: 1 %
HCT: 34.7 % — ABNORMAL LOW (ref 36.0–46.0)
Hemoglobin: 11.3 g/dL — ABNORMAL LOW (ref 12.0–15.0)
LYMPHS ABS: 1.2 10*3/uL (ref 0.7–4.0)
Lymphocytes Relative: 12 %
MCH: 29 pg (ref 26.0–34.0)
MCHC: 32.6 g/dL (ref 30.0–36.0)
MCV: 89.2 fL (ref 78.0–100.0)
MONO ABS: 1.1 10*3/uL — AB (ref 0.1–1.0)
Monocytes Relative: 11 %
Neutro Abs: 7.8 10*3/uL — ABNORMAL HIGH (ref 1.7–7.7)
Neutrophils Relative %: 76 %
Platelets: 398 10*3/uL (ref 150–400)
RBC: 3.89 MIL/uL (ref 3.87–5.11)
RDW: 13.9 % (ref 11.5–15.5)
WBC: 10.3 10*3/uL (ref 4.0–10.5)

## 2016-07-24 LAB — AMMONIA: Ammonia: 16 umol/L (ref 9–35)

## 2016-07-24 LAB — COMPREHENSIVE METABOLIC PANEL
ALT: 14 U/L (ref 14–54)
AST: 17 U/L (ref 15–41)
Albumin: 3 g/dL — ABNORMAL LOW (ref 3.5–5.0)
Alkaline Phosphatase: 171 U/L — ABNORMAL HIGH (ref 38–126)
Anion gap: 8 (ref 5–15)
BUN: 12 mg/dL (ref 6–20)
CHLORIDE: 104 mmol/L (ref 101–111)
CO2: 25 mmol/L (ref 22–32)
CREATININE: 0.55 mg/dL (ref 0.44–1.00)
Calcium: 8.8 mg/dL — ABNORMAL LOW (ref 8.9–10.3)
GFR calc non Af Amer: >60 mL/min (ref >60)
Glucose, Bld: 97 mg/dL (ref 65–99)
POTASSIUM: 3.8 mmol/L (ref 3.5–5.1)
SODIUM: 137 mmol/L (ref 135–145)
Total Bilirubin: 1 mg/dL (ref 0.3–1.2)
Total Protein: 6.5 g/dL (ref 6.5–8.1)

## 2016-07-24 LAB — I-STAT TROPONIN, ED: TROPONIN I, POC: 0 ng/mL (ref 0.00–0.08)

## 2016-07-24 LAB — I-STAT CG4 LACTIC ACID, ED: LACTIC ACID, VENOUS: 0.97 mmol/L (ref 0.5–1.9)

## 2016-07-24 LAB — TSH: TSH: 0.732 u[IU]/mL (ref 0.350–4.500)

## 2016-07-24 LAB — MRSA PCR SCREENING: MRSA by PCR: NEGATIVE

## 2016-07-24 MED ORDER — DOCUSATE SODIUM 100 MG PO CAPS: 100.0000 mg | ORAL_CAPSULE | Freq: Every day | ORAL | Status: DC | PRN

## 2016-07-24 MED ORDER — ENOXAPARIN SODIUM 40 MG/0.4ML ~~LOC~~ SOLN
40.0000 mg | SUBCUTANEOUS | Status: DC
  Administered 2016-07-24 – 2016-07-27 (×3): 40 mg via SUBCUTANEOUS
  Filled 2016-07-24 (×3): qty 0.4

## 2016-07-24 MED ORDER — VANCOMYCIN HCL IN DEXTROSE 1-5 GM/200ML-% IV SOLN
1000.0000 mg | Freq: Once | INTRAVENOUS | Status: AC
  Administered 2016-07-24: 1000 mg via INTRAVENOUS
  Filled 2016-07-24: qty 200

## 2016-07-24 MED ORDER — FOLIC ACID 1 MG PO TABS
1.0000 mg | ORAL_TABLET | Freq: Every day | ORAL | Status: DC
  Administered 2016-07-25 – 2016-07-28 (×4): 1 mg via ORAL
  Filled 2016-07-24 (×4): qty 1

## 2016-07-24 MED ORDER — ENSURE ENLIVE PO LIQD
237.0000 mL | Freq: Two times a day (BID) | ORAL | Status: DC
  Administered 2016-07-25 – 2016-07-28 (×6): 237 mL via ORAL

## 2016-07-24 MED ORDER — VANCOMYCIN HCL IN DEXTROSE 750-5 MG/150ML-% IV SOLN
750.0000 mg | Freq: Two times a day (BID) | INTRAVENOUS | Status: DC
  Administered 2016-07-25: 750 mg via INTRAVENOUS
  Filled 2016-07-24: qty 150

## 2016-07-24 MED ORDER — BUSPIRONE HCL 10 MG PO TABS
15.0000 mg | ORAL_TABLET | Freq: Two times a day (BID) | ORAL | Status: DC
  Administered 2016-07-24 – 2016-07-28 (×8): 15 mg via ORAL
  Filled 2016-07-24: qty 3
  Filled 2016-07-24 (×2): qty 1.5
  Filled 2016-07-24: qty 3
  Filled 2016-07-24 (×2): qty 1.5
  Filled 2016-07-24 (×2): qty 3
  Filled 2016-07-24: qty 1.5
  Filled 2016-07-24 (×3): qty 3
  Filled 2016-07-24 (×2): qty 1.5
  Filled 2016-07-24: qty 3
  Filled 2016-07-24: qty 1.5

## 2016-07-24 MED ORDER — LORATADINE 10 MG PO TABS: 10.0000 mg | ORAL_TABLET | Freq: Every day | ORAL | Status: DC | PRN

## 2016-07-24 MED ORDER — RISPERIDONE 0.5 MG PO TABS
0.2500 mg | ORAL_TABLET | Freq: Every day | ORAL | Status: DC
  Administered 2016-07-24 – 2016-07-27 (×4): 0.25 mg via ORAL
  Filled 2016-07-24: qty 0.5
  Filled 2016-07-24: qty 1
  Filled 2016-07-24: qty 0.5
  Filled 2016-07-24: qty 1
  Filled 2016-07-24 (×2): qty 0.5
  Filled 2016-07-24 (×2): qty 1

## 2016-07-24 MED ORDER — PIPERACILLIN-TAZOBACTAM 3.375 G IVPB
3.3750 g | Freq: Three times a day (TID) | INTRAVENOUS | Status: DC
  Filled 2016-07-24: qty 50

## 2016-07-24 MED ORDER — GABAPENTIN 100 MG PO CAPS: 100.0000 mg | ORAL_CAPSULE | Freq: Three times a day (TID) | ORAL | Status: DC

## 2016-07-24 MED ORDER — OCUVITE-LUTEIN PO CAPS
1.0000 | ORAL_CAPSULE | Freq: Every day | ORAL | Status: DC
  Filled 2016-07-24: qty 1

## 2016-07-24 MED ORDER — HYDROCERIN EX CREA
1.0000 "application " | TOPICAL_CREAM | Freq: Two times a day (BID) | CUTANEOUS | Status: DC | PRN
  Administered 2016-07-28: 1 via TOPICAL
  Filled 2016-07-24: qty 113

## 2016-07-24 MED ORDER — BUPROPION HCL ER (XL) 150 MG PO TB24
300.0000 mg | ORAL_TABLET | Freq: Every day | ORAL | Status: DC
  Administered 2016-07-25 – 2016-07-28 (×4): 300 mg via ORAL
  Filled 2016-07-24 (×4): qty 2

## 2016-07-24 MED ORDER — SODIUM CHLORIDE 0.9 % IV SOLN
INTRAVENOUS | Status: AC
  Administered 2016-07-24: 21:00:00 via INTRAVENOUS

## 2016-07-24 MED ORDER — MIRABEGRON ER 25 MG PO TB24
50.0000 mg | ORAL_TABLET | Freq: Every day | ORAL | Status: DC
  Administered 2016-07-25 – 2016-07-28 (×4): 50 mg via ORAL
  Filled 2016-07-24 (×4): qty 2

## 2016-07-24 MED ORDER — DEXTROSE 5 % IV SOLN
2.0000 g | INTRAVENOUS | Status: DC
  Administered 2016-07-24 – 2016-07-26 (×3): 2 g via INTRAVENOUS
  Filled 2016-07-24 (×4): qty 2

## 2016-07-24 MED ORDER — DULOXETINE HCL 30 MG PO CPEP
120.0000 mg | ORAL_CAPSULE | Freq: Every day | ORAL | Status: DC
Start: 2016-07-18 — End: 2016-07-28
  Administered 2016-07-25 – 2016-07-28 (×4): 120 mg via ORAL
  Filled 2016-07-24 (×4): qty 4

## 2016-07-24 MED ORDER — GABAPENTIN 100 MG PO CAPS
100.0000 mg | ORAL_CAPSULE | Freq: Two times a day (BID) | ORAL | Status: DC
  Administered 2016-07-24 – 2016-07-28 (×8): 100 mg via ORAL
  Filled 2016-07-24 (×8): qty 1

## 2016-07-24 MED ORDER — GABAPENTIN 100 MG PO CAPS
200.0000 mg | ORAL_CAPSULE | Freq: Every day | ORAL | Status: DC
  Administered 2016-07-24 – 2016-07-27 (×4): 200 mg via ORAL
  Filled 2016-07-24 (×4): qty 2

## 2016-07-24 MED ORDER — ACETAMINOPHEN 325 MG PO TABS: 650.0000 mg | ORAL_TABLET | Freq: Four times a day (QID) | ORAL | Status: DC | PRN

## 2016-07-24 MED ORDER — CHOLESTYRAMINE LIGHT 4 G PO PACK
4.0000 g | PACK | Freq: Every day | ORAL | Status: DC
  Filled 2016-07-24: qty 1

## 2016-07-24 MED ORDER — PIPERACILLIN-TAZOBACTAM 3.375 G IVPB 30 MIN: 3.3750 g | Freq: Once | INTRAVENOUS | Status: DC

## 2016-07-24 MED ORDER — MELATONIN 5 MG PO TABS: 10.0000 mg | ORAL_TABLET | Freq: Every day | ORAL | Status: DC

## 2016-07-24 MED ORDER — DICLOFENAC SODIUM 1 % TD GEL
4.0000 g | Freq: Four times a day (QID) | TRANSDERMAL | Status: DC | PRN
  Administered 2016-07-28: 10:00:00 4 g via TOPICAL
  Filled 2016-07-24: qty 100

## 2016-07-24 MED ORDER — CALCITONIN (SALMON) 200 UNIT/ACT NA SOLN
1.0000 | Freq: Every day | NASAL | Status: DC
  Administered 2016-07-25 – 2016-07-28 (×4): 1 via NASAL
  Filled 2016-07-24: qty 3.7

## 2016-07-24 MED ORDER — OXYCODONE HCL 5 MG PO TABS
5.0000 mg | ORAL_TABLET | Freq: Two times a day (BID) | ORAL | Status: DC | PRN
  Administered 2016-07-26 – 2016-07-27 (×2): 5 mg via ORAL
  Filled 2016-07-24 (×2): qty 1

## 2016-07-24 MED ORDER — GUAIFENESIN ER 600 MG PO TB12: 1200.0000 mg | ORAL_TABLET | Freq: Every day | ORAL | Status: DC | PRN

## 2016-07-24 MED ORDER — HYDRALAZINE HCL 20 MG/ML IJ SOLN: 10.0000 mg | INTRAMUSCULAR | Status: DC | PRN

## 2016-07-24 MED ORDER — SODIUM CHLORIDE 0.9 % IV BOLUS (SEPSIS)
1000.0000 mL | Freq: Once | INTRAVENOUS | Status: AC
  Administered 2016-07-24: 1000 mL via INTRAVENOUS

## 2016-07-24 MED ORDER — ASPIRIN EC 81 MG PO TBEC
81.0000 mg | DELAYED_RELEASE_TABLET | Freq: Every day | ORAL | Status: DC
  Administered 2016-07-25 – 2016-07-28 (×4): 81 mg via ORAL
  Filled 2016-07-24 (×4): qty 1

## 2016-07-24 MED ORDER — DULOXETINE HCL 30 MG PO CPEP
30.0000 mg | ORAL_CAPSULE | Freq: Every evening | ORAL | Status: DC
  Administered 2016-07-24 – 2016-07-27 (×4): 30 mg via ORAL
  Filled 2016-07-24 (×4): qty 1

## 2016-07-24 MED ORDER — VITAMIN D 1000 UNITS PO TABS
2000.0000 [IU] | ORAL_TABLET | Freq: Every day | ORAL | Status: DC
  Administered 2016-07-25 – 2016-07-28 (×4): 2000 [IU] via ORAL
  Filled 2016-07-24 (×4): qty 2
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
## 2016-07-24 NOTE — Progress Notes (Signed)
Pharmacy Antibiotic Note  Tracy Sharp is a 83 y.o. female from SNF, recently treated with PO abx for UTI, admitted on 07/24/2016 with early right-sided PNA.  Pharmacy has been consulted for Vancomycin and Zosyn dosing.  Plan: Vancomycin 1000 mg IV now, then 750 mg IV q12 hr; goal trough 15-20 mcg/mL Measure vancomycin trough levels at steady state as indicated Zosyn 3.375 g IV given once over 30 minutes, then every 8 hrs by 4-hr infusion Daily creatinine given multiple risk factors for AKI, including vanc/Zosyn  Height: 5\' 5"  (165.1 cm) Weight: 175 lb (79.4 kg) IBW/kg (Calculated) : 57  Temp (24hrs), Avg:98.1 F (36.7 C), Min:98.1 F (36.7 C), Max:98.1 F (36.7 C)   Recent Labs Lab 07/24/16 1641 07/24/16 1653  WBC 10.3  --   CREATININE 0.55  --   LATICACIDVEN  --  0.97    Estimated Creatinine Clearance: 49.7 mL/min (by C-G formula based on SCr of 0.55 mg/dL).    Allergies  Allergen Reactions  . Doxycycline Other (See Comments)    intolerance  . Hydrocodone Other (See Comments)    intolerance  . Norco [Hydrocodone-Acetaminophen] Other (See Comments)    Nausea, dizziness  . Toviaz [Fesoterodine Fumarate Er] Other (See Comments)    dizzy  . Plaquenil [Hydroxychloroquine] Rash     Thank you for allowing pharmacy to be a part of this patient's care.  Reuel Boom, PharmD, BCPS Pager: [phone] 07/24/2016, 6:20 PM

## 2016-07-24 NOTE — Progress Notes (Signed)
PHARMACIST - PHYSICIAN ORDER COMMUNICATION  CONCERNING: P&T Medication Policy on Herbal Medications  DESCRIPTION:  This patient's order for:  Melatonin  has been noted.  This product(s) is classified as an "herbal" or natural product. Due to a lack of definitive safety studies or FDA approval, nonstandard manufacturing practices, plus the potential risk of unknown drug-drug interactions while on inpatient medications, the Pharmacy and Therapeutics Committee does not permit the use of "herbal" or natural products of this type within [facility].   ACTION TAKEN: The pharmacy department is unable to verify this order at this time and your patient has been informed of this safety policy. Please reevaluate patient's clinical condition at discharge and address if the herbal or natural product(s) should be resumed at that time.  Minda Ditto PharmD Pager [phone] 07/24/2016, 8:55 PM

## 2016-07-24 NOTE — ED Notes (Signed)
Patient already urinated before specimen could be obtained. EDP and RN aware. Will let patient receive fluids then try to obtain sample again.

## 2016-07-24 NOTE — Progress Notes (Signed)
PHARMACY NOTE:  ANTIMICROBIAL RENAL DOSAGE ADJUSTMENT  Current antimicrobial regimen includes a mismatch between antimicrobial dosage and estimated renal function.  As per policy approved by the Pharmacy & Therapeutics and Medical Executive Committees, the antimicrobial dosage will be adjusted accordingly.  Current antimicrobial dosage:  Cefepime 1gm ordered q8h  Indication: HCAP  Renal Function:   Estimated Creatinine Clearance: 49.7 mL/min (by C-G formula based on SCr of 0.55 mg/dL).  Normalized Cr clearance ~ 40 ml/min []      On intermittent HD, scheduled: []      On CRRT    Antimicrobial dosage has been changed to:  Cefepime 2gm q24  Thank you for allowing pharmacy to be a part of this patient's care.  Minda Ditto  07/24/2016 8:26 PM

## 2016-07-24 NOTE — ED Notes (Signed)
Bed: DJ24 Expected date: 07/24/16 Expected time: 4:05 PM Means of arrival: Ambulance Comments: AMS due to meds

## 2016-07-24 NOTE — ED Triage Notes (Signed)
Pt arrived via EMS from [facility] due to [place] which the staff reports worsened over the coarse of taking Amoxicillin. Per ems patient was alert to self in route, able to verify that she lives in a nursing facility but can't remember the name, continously asked "why am I going to the hospital my legs always hurt", also stated am I going to [facility] or [facility]. VS: CBG 86, 160/90, 96 regular, 98% RA,18 rsp,  NSR. Patient denies pain per ems states it comes and goes.
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
## 2016-07-24 NOTE — ED Notes (Signed)
Pt back from CT scan

## 2016-07-25 DIAGNOSIS — M48062 Spinal stenosis, lumbar region with neurogenic claudication: Secondary | ICD-10-CM | POA: Diagnosis present

## 2016-07-25 DIAGNOSIS — Z8672 Personal history of thrombophlebitis: Secondary | ICD-10-CM | POA: Diagnosis not present

## 2016-07-25 DIAGNOSIS — N3281 Overactive bladder: Secondary | ICD-10-CM | POA: Diagnosis not present

## 2016-07-25 DIAGNOSIS — J181 Lobar pneumonia, unspecified organism: Principal | ICD-10-CM

## 2016-07-25 DIAGNOSIS — M069 Rheumatoid arthritis, unspecified: Secondary | ICD-10-CM

## 2016-07-25 DIAGNOSIS — I10 Essential (primary) hypertension: Secondary | ICD-10-CM | POA: Diagnosis not present

## 2016-07-25 DIAGNOSIS — M6281 Muscle weakness (generalized): Secondary | ICD-10-CM | POA: Diagnosis not present

## 2016-07-25 DIAGNOSIS — Z800 Family history of malignant neoplasm of digestive organs: Secondary | ICD-10-CM | POA: Diagnosis not present

## 2016-07-25 DIAGNOSIS — G9340 Encephalopathy, unspecified: Secondary | ICD-10-CM

## 2016-07-25 DIAGNOSIS — Z8249 Family history of ischemic heart disease and other diseases of the circulatory system: Secondary | ICD-10-CM | POA: Diagnosis not present

## 2016-07-25 DIAGNOSIS — J189 Pneumonia, unspecified organism: Secondary | ICD-10-CM | POA: Diagnosis not present

## 2016-07-25 DIAGNOSIS — D649 Anemia, unspecified: Secondary | ICD-10-CM | POA: Diagnosis present

## 2016-07-25 DIAGNOSIS — Z90710 Acquired absence of both cervix and uterus: Secondary | ICD-10-CM | POA: Diagnosis not present

## 2016-07-25 DIAGNOSIS — I360 Nonrheumatic tricuspid (valve) stenosis: Secondary | ICD-10-CM | POA: Diagnosis not present

## 2016-07-25 DIAGNOSIS — M79605 Pain in left leg: Secondary | ICD-10-CM | POA: Diagnosis not present

## 2016-07-25 DIAGNOSIS — E785 Hyperlipidemia, unspecified: Secondary | ICD-10-CM | POA: Diagnosis not present

## 2016-07-25 DIAGNOSIS — I350 Nonrheumatic aortic (valve) stenosis: Secondary | ICD-10-CM | POA: Diagnosis not present

## 2016-07-25 DIAGNOSIS — Z9181 History of falling: Secondary | ICD-10-CM | POA: Diagnosis not present

## 2016-07-25 DIAGNOSIS — Z96653 Presence of artificial knee joint, bilateral: Secondary | ICD-10-CM | POA: Diagnosis present

## 2016-07-25 DIAGNOSIS — Z888 Allergy status to other drugs, medicaments and biological substances status: Secondary | ICD-10-CM | POA: Diagnosis not present

## 2016-07-25 DIAGNOSIS — R4182 Altered mental status, unspecified: Secondary | ICD-10-CM | POA: Diagnosis not present

## 2016-07-25 DIAGNOSIS — G609 Hereditary and idiopathic neuropathy, unspecified: Secondary | ICD-10-CM | POA: Diagnosis not present

## 2016-07-25 DIAGNOSIS — R531 Weakness: Secondary | ICD-10-CM | POA: Diagnosis not present

## 2016-07-25 DIAGNOSIS — R0902 Hypoxemia: Secondary | ICD-10-CM | POA: Diagnosis not present

## 2016-07-25 DIAGNOSIS — Z974 Presence of external hearing-aid: Secondary | ICD-10-CM | POA: Diagnosis not present

## 2016-07-25 DIAGNOSIS — F4323 Adjustment disorder with mixed anxiety and depressed mood: Secondary | ICD-10-CM | POA: Diagnosis not present

## 2016-07-25 DIAGNOSIS — G8928 Other chronic postprocedural pain: Secondary | ICD-10-CM | POA: Diagnosis not present

## 2016-07-25 DIAGNOSIS — Z885 Allergy status to narcotic agent status: Secondary | ICD-10-CM | POA: Diagnosis not present

## 2016-07-25 DIAGNOSIS — Z89422 Acquired absence of other left toe(s): Secondary | ICD-10-CM | POA: Diagnosis not present

## 2016-07-25 DIAGNOSIS — G9009 Other idiopathic peripheral autonomic neuropathy: Secondary | ICD-10-CM | POA: Diagnosis not present

## 2016-07-25 DIAGNOSIS — M810 Age-related osteoporosis without current pathological fracture: Secondary | ICD-10-CM | POA: Diagnosis not present

## 2016-07-25 DIAGNOSIS — R2689 Other abnormalities of gait and mobility: Secondary | ICD-10-CM | POA: Diagnosis not present

## 2016-07-25 DIAGNOSIS — Y95 Nosocomial condition: Secondary | ICD-10-CM | POA: Diagnosis present

## 2016-07-25 DIAGNOSIS — R269 Unspecified abnormalities of gait and mobility: Secondary | ICD-10-CM | POA: Diagnosis not present

## 2016-07-25 DIAGNOSIS — N390 Urinary tract infection, site not specified: Secondary | ICD-10-CM | POA: Diagnosis present

## 2016-07-25 DIAGNOSIS — Z801 Family history of malignant neoplasm of trachea, bronchus and lung: Secondary | ICD-10-CM | POA: Diagnosis not present

## 2016-07-25 DIAGNOSIS — F418 Other specified anxiety disorders: Secondary | ICD-10-CM | POA: Diagnosis not present

## 2016-07-25 DIAGNOSIS — G92 Toxic encephalopathy: Secondary | ICD-10-CM | POA: Diagnosis present

## 2016-07-25 DIAGNOSIS — E440 Moderate protein-calorie malnutrition: Secondary | ICD-10-CM | POA: Diagnosis present

## 2016-07-25 DIAGNOSIS — I2510 Atherosclerotic heart disease of native coronary artery without angina pectoris: Secondary | ICD-10-CM | POA: Diagnosis present

## 2016-07-25 DIAGNOSIS — G8929 Other chronic pain: Secondary | ICD-10-CM | POA: Diagnosis not present

## 2016-07-25 DIAGNOSIS — M79606 Pain in leg, unspecified: Secondary | ICD-10-CM | POA: Diagnosis not present

## 2016-07-25 LAB — CBC WITH DIFFERENTIAL/PLATELET
BASOS PCT: 0 %
Basophils Absolute: 0 10*3/uL (ref 0.0–0.1)
EOS ABS: 0.2 10*3/uL (ref 0.0–0.7)
EOS PCT: 2 %
HCT: 32 % — ABNORMAL LOW (ref 36.0–46.0)
Hemoglobin: 10.2 g/dL — ABNORMAL LOW (ref 12.0–15.0)
Lymphocytes Relative: 14 %
Lymphs Abs: 1.1 10*3/uL (ref 0.7–4.0)
MCH: 28.9 pg (ref 26.0–34.0)
MCHC: 31.9 g/dL (ref 30.0–36.0)
MCV: 90.7 fL (ref 78.0–100.0)
MONO ABS: 0.8 10*3/uL (ref 0.1–1.0)
MONOS PCT: 11 %
NEUTROS PCT: 73 %
Neutro Abs: 5.8 10*3/uL (ref 1.7–7.7)
Platelets: 354 10*3/uL (ref 150–400)
RBC: 3.53 MIL/uL — ABNORMAL LOW (ref 3.87–5.11)
RDW: 14.2 % (ref 11.5–15.5)
WBC: 7.9 10*3/uL (ref 4.0–10.5)

## 2016-07-25 LAB — URINALYSIS, ROUTINE W REFLEX MICROSCOPIC
BILIRUBIN URINE: NEGATIVE
Glucose, UA: NEGATIVE mg/dL
HGB URINE DIPSTICK: NEGATIVE
Ketones, ur: NEGATIVE mg/dL
Nitrite: NEGATIVE
Protein, ur: NEGATIVE mg/dL
SPECIFIC GRAVITY, URINE: 1.013 (ref 1.005–1.030)
pH: 6 (ref 5.0–8.0)

## 2016-07-25 LAB — CREATININE, SERUM
CREATININE: 0.45 mg/dL (ref 0.44–1.00)
GFR calc Af Amer: >60 mL/min (ref >60)
GFR calc non Af Amer: >60 mL/min (ref >60)

## 2016-07-25 LAB — VITAMIN B12: VITAMIN B 12: 356 pg/mL (ref 180–914)

## 2016-07-25 LAB — RPR: RPR: NONREACTIVE

## 2016-07-25 LAB — GLUCOSE, CAPILLARY: Glucose-Capillary: 106 mg/dL — ABNORMAL HIGH (ref 65–99)

## 2016-07-25 LAB — HIV ANTIBODY (ROUTINE TESTING W REFLEX): HIV Screen 4th Generation wRfx: NONREACTIVE

## 2016-07-25 MED ORDER — CHOLESTYRAMINE 4 G PO PACK
4.0000 g | PACK | Freq: Every day | ORAL | Status: DC
  Administered 2016-07-25 – 2016-07-27 (×3): 4 g via ORAL
  Filled 2016-07-25 (×4): qty 1

## 2016-07-25 MED ORDER — PROSIGHT PO TABS
1.0000 | ORAL_TABLET | Freq: Every day | ORAL | Status: DC
  Administered 2016-07-25 – 2016-07-28 (×4): 1 via ORAL
  Filled 2016-07-25 (×4): qty 1

## 2016-07-25 NOTE — Progress Notes (Addendum)
PROGRESS NOTE  Tracy Sharp  CBJ:[id number] DOB: 10/25/1924 DOA: 07/24/2016 PCP: Harlan Stains, MD   Brief Narrative: Tracy Sharp is an 83 y.o. female with a history of RA, depression, anxiety, chronic pain and memory impairment who presented from memory care for decline in functional status. Her daughter-in-law assists with history, statin she has noticed about 1 month of gradually worsening memory, decreased appetite, increasing sleep, social withdrawal, and over the past week this has developed to include imbalance. This was in the wake of recent C. diff infection and UTI, seems like she hasn't bounced back. The patient noted mild coughing without fever and improvement in urinary symptoms with keflex prescribed 6/11.   On arrival, she was afebrile, saturating adequately on room air. CXR remonstrated early RLL infiltrate. No leukocytosis. Blood cultures were drawn, NS administered, and broad spectrum antibiotics started for HCAP. She continues to be diffusely weak, so PT evaluation is requested for disposition. Vancomycin was stopped due to negative MRSA PCR, and treatment is ongoing with cefepime.   Assessment & Plan: Principal Problem:   HCAP (healthcare-associated pneumonia) Active Problems:   Depression with anxiety   Essential hypertension   GAIT DISTURBANCE   Chronic leg pain   General weakness   Rheumatoid arthritis ([name])   Spinal stenosis of lumbar region with neurogenic claudication   Anemia   Acute encephalopathy  Acute encephalopathy: Decline x1 month punctuated over the past few days, suspect due to infection. CT head shows stable microvascular disease without stroke. TSH normal at 0.732, RPR and HIV non-reactive, B12 normal at 356.  - Continues despite treatment thus far, will change to inpatient status. - Treat pneumonia as below - Continue treating mood disorders, consider psychiatry referral for possible pseudodementia.  - Delirium precautions - Minimize sedating and  anticholinergic medications  Right lower lobar pneumonia: Treating as HCAP due institutionalization. No leukocytosis, hypoxia, or fever, though pt is persistently tachypneic.   - Continue cefepime - Monitor sputum and blood cultures  Depression, anxiety:   - Continue home medications: buspar, wellbutrin, cymbalta, risperdal.   Systolic murmur: No echocardiogram on file, also has trace pitting LE edema.  - Check echocardiogram.   Rheumatoid arthritis, chronic pain: Recently attempted wean from pain medications unsuccessfully.  - Continue home-regimen: MTX, oxyIR prn.   Generalized weakness/fatigue:  - PT consulted  DVT prophylaxis: Lovenox Code Status: Full Family Communication: Discussed with DIL per pt permission.  Disposition Plan: Uncertain  Consultants:   None  Procedures:   None  Antimicrobials:  Vancomycin 6/24 - 6/25  Zosyn 6/24  Cefepime 6/24 >>    Subjective: Pt unchanged from admission, wants coffee but has no appetite and did not eat any breakfast.   Objective: Vitals:   07/24/16 1929 07/24/16 2049 07/25/16 0525 07/25/16 1405  BP: (!) 144/79 (!) 127/49 (!) 111/99 (!) 122/46  Pulse: 86 85 90 81  Resp: 18 (!) 21 20 20   Temp:  99 F (37.2 C) 98.1 F (36.7 C) 98.8 F (37.1 C)  TempSrc:  Oral Oral Oral  SpO2: 98% 99% 91% 100%  Weight:  80.1 kg (176 lb 9.4 oz)    Height:  5\' 5"  (1.651 m)      Intake/Output Summary (Last 24 hours) at 07/25/16 1529 Last data filed at 07/25/16 1300  Gross per 24 hour  Intake              890 ml  Output  0 ml  Net              890 ml   Filed Weights   07/24/16 1631 07/24/16 2049  Weight: 79.4 kg (175 lb) 80.1 kg (176 lb 9.4 oz)    Examination: General exam: Elderly, frail-appearing female in no distress Respiratory system: Tachypneic. Clear to auscultation bilaterally.  Cardiovascular system: Regular rate and rhythm. III/VI systolic murmur at RUSB, no rub, or gallop. No JVD, and trace pitting  pedal edema. Gastrointestinal system: Abdomen soft, non-tender, non-distended, with normoactive bowel sounds. No organomegaly or masses felt. Central nervous system: Lethargic, oriented to person and "hospital." Diffusely weak, though no focal neurological deficits. Extremities: Warm, no deformities Skin: No rashes, lesions no ulcers Psychiatry: Judgement and insight appear fair. Mood & affect appropriate.   Data Reviewed: I have personally reviewed following labs and imaging studies  CBC:  Recent Labs Lab 07/24/16 1641 07/25/16 0432  WBC 10.3 7.9  NEUTROABS 7.8* 5.8  HGB 11.3* 10.2*  HCT 34.7* 32.0*  MCV 89.2 90.7  PLT 398 161   Basic Metabolic Panel:  Recent Labs Lab 07/24/16 1641 07/25/16 0432  NA 137  --   K 3.8  --   CL 104  --   CO2 25  --   GLUCOSE 97  --   BUN 12  --   CREATININE 0.55 0.45  CALCIUM 8.8*  --    GFR: Estimated Creatinine Clearance: 49.8 mL/min (by C-G formula based on SCr of 0.45 mg/dL). Liver Function Tests:  Recent Labs Lab 07/24/16 1641  AST 17  ALT 14  ALKPHOS 171*  BILITOT 1.0  PROT 6.5  ALBUMIN 3.0*   No results for input(s): LIPASE, AMYLASE in the last 168 hours.  Recent Labs Lab 07/24/16 2115  AMMONIA 16   Coagulation Profile: No results for input(s): INR, PROTIME in the last 168 hours. Cardiac Enzymes: No results for input(s): CKTOTAL, CKMB, CKMBINDEX, TROPONINI in the last 168 hours. BNP (last 3 results) No results for input(s): PROBNP in the last 8760 hours. HbA1C: No results for input(s): HGBA1C in the last 72 hours. CBG:  Recent Labs Lab 07/25/16 0523  GLUCAP 106*   Lipid Profile: No results for input(s): CHOL, HDL, LDLCALC, TRIG, CHOLHDL, LDLDIRECT in the last 72 hours. Thyroid Function Tests:  Recent Labs  07/24/16 2115  TSH 0.732   Anemia Panel:  Recent Labs  07/24/16 2115  VITAMINB12 356   Urine analysis:    Component Value Date/Time   COLORURINE YELLOW 07/11/2016 1734   APPEARANCEUR  CLOUDY (A) 07/11/2016 1734   LABSPEC 1.020 07/11/2016 1734   PHURINE 6.0 07/11/2016 1734   GLUCOSEU NEGATIVE 07/11/2016 1734   HGBUR NEGATIVE 07/11/2016 1734   HGBUR trace-intact 10/02/2008 1200   BILIRUBINUR NEGATIVE 07/11/2016 1734   KETONESUR NEGATIVE 07/11/2016 1734   PROTEINUR NEGATIVE 07/11/2016 1734   UROBILINOGEN 1.0 04/28/2014 1607   NITRITE POSITIVE (A) 07/11/2016 1734   LEUKOCYTESUR LARGE (A) 07/11/2016 1734   Recent Results (from the past 240 hour(s))  MRSA PCR Screening     Status: None   Collection Time: 07/24/16  9:43 PM  Result Value Ref Range Status   MRSA by PCR NEGATIVE NEGATIVE Final    Comment:        The GeneXpert MRSA Assay (FDA approved for NASAL specimens only), is one component of a comprehensive MRSA colonization surveillance program. It is not intended to diagnose MRSA infection nor to guide or monitor treatment for MRSA infections.  Radiology Studies: Dg Chest 2 View  Result Date: 07/24/2016 CLINICAL DATA:  Altered mental status, weakness EXAM: CHEST  2 VIEW COMPARISON:  07/11/2016 FINDINGS: Cardiac shadow is mildly enlarged but stable. Aortic calcifications are again seen. The lungs are well aerated bilaterally. Patchy changes are noted in right lower lobe which may represent some very early infiltrate. No sizable effusion is seen. No bony abnormality is noted. IMPRESSION: Early right lower lobe infiltrate. Electronically Signed   By: Inez Catalina M.D.   On: 07/24/2016 17:20   Ct Head Wo Contrast  Result Date: 07/24/2016 CLINICAL DATA:  AMS which the staff reports worsened over the coarse of taking Amoxicillin. Per ems patient was alert to self in route, able to verify that she lives in a nursing facility but can't remember the name, continously asked why am I going to the hospital my legs always hurt EXAM: CT HEAD WITHOUT CONTRAST TECHNIQUE: Contiguous axial images were obtained from the base of the skull through the vertex without intravenous  contrast. COMPARISON:  02/15/2016 FINDINGS: Brain: No evidence of acute infarction, hemorrhage, hydrocephalus, extra-axial collection or mass lesion/mass effect. There is ventricular, and a lesser degree of sulcal, enlargement reflecting moderate atrophy, stable. Periventricular white matter hypoattenuation is also noted consistent with mild chronic microvascular ischemic change. Vascular: No hyperdense vessel or unexpected calcification. Skull: Normal. Negative for fracture or focal lesion. Sinuses/Orbits: Globes and orbits are unremarkable. Visualized sinuses and mastoid air cells are clear. Other: None. IMPRESSION: 1. No acute intracranial abnormalities. 2. Atrophy and chronic microvascular ischemic change. Stable appearance from the prior study. Electronically Signed   By: Lajean Manes M.D.   On: 07/24/2016 20:20    Scheduled Meds: . aspirin EC  81 mg Oral Daily  . buPROPion  300 mg Oral Daily  . busPIRone  15 mg Oral BID  . calcitonin (salmon)  1 spray Alternating Nares Daily  . cholecalciferol  2,000 Units Oral Daily  . cholestyramine  4 g Oral Q lunch  . DULoxetine  120 mg Oral Daily  . DULoxetine  30 mg Oral QPM  . enoxaparin (LOVENOX) injection  40 mg Subcutaneous Q24H  . feeding supplement (ENSURE ENLIVE)  237 mL Oral BID BM  . folic acid  1 mg Oral Daily  . gabapentin  100 mg Oral BID   And  . gabapentin  200 mg Oral QHS  . mirabegron ER  50 mg Oral Daily  . multivitamin  1 tablet Oral Daily  . risperiDONE  0.25 mg Oral QHS   Continuous Infusions: . ceFEPime (MAXIPIME) IV Stopped (07/24/16 2250)     LOS: 0 days   Time spent: 25 minutes.  Vance Gather, MD [facility] Pager [phone]  If 7PM-7AM, please contact night-coverage [email or web address] Password [facility] 07/25/2016, 3:29 PM

## 2016-07-25 NOTE — Clinical Social Work Note (Signed)
Clinical Social Work Assessment  Patient Details  Name: Tracy Sharp MRN: [id number] Date of Birth: 1924/09/10  Date of referral:  07/25/16               Reason for consult:   (pt admitted from facility)                Permission sought to share information with:  Family Supports Permission granted to share information::     Name::     Daughter-in-law [name]  Agency::  [facility]  Relationship::     Contact Information:     Housing/Transportation Living arrangements for the past 2 months:  [place] of Information:  Facility, Adult Children Patient Interpreter Needed:  None Criminal Activity/Legal Involvement Pertinent to Current Situation/Hospitalization:  No - Comment as needed Significant Relationships:  Adult Children, Other Family Members, Community Support Lives with:  Facility Resident Do you feel safe going back to the place where you live?  Yes Need for family participation in patient care:  Yes (Comment) (son and daughter-in-law primary decision makers)  Care giving concerns: pt from [facility] assisted living. Has resided there since march 2018 per DIL. At for past 2 months ambulates with wheelchair only, has been able to get out of bed bearing weight with one-person assist but cannot ambulate. DIL states pt was able to ambulate with a walker about 2 months ago, participated in [facility] and improved, then declined again. DIL states pt was supposed to have resumed HHPT this week. Facility states pt requires extensive assistance with both ADLs and ambulating. States pt has been engaged and interactive there until recently prior to this hospital admission.  DIL states she feels pt will at some point need SNF long term care instead of ALF and have been getting advised as far as how to go about qualifying her for medicaid so they can be prepared for that when the time comes.    Social Worker assessment / plan:  CSW consulted as pt was  admitted from facility- [facility]. Per daughter in law and facility, plan to have pt return at [place]. Will resume HHPT there. Will provide updated FL2 for facility at time of DC.  Plan: return to assisted living at [place]. Will follow and assist.  Employment status:  Retired Forensic scientist:  Medicare PT Recommendations:  Not assessed at this time Information / Referral to community resources:     Patient/Family's Response to care:  Appreciative of care  Patient/Family's Understanding of and Emotional Response to Diagnosis, Current Treatment, and Prognosis:  Family demonstrates adequate understanding of plan and are forward thinking re: being prepared for pt's potential needs in the future as her functioning progresses or declines.   Emotional Assessment Appearance:  Appears stated age Attitude/Demeanor/Rapport:   (unremarkable, appropriate) Affect (typically observed):  Calm Orientation:  Oriented to Self, Oriented to Place Alcohol / Substance use:  Not Applicable Psych involvement (Current and /or in the community):  No (Comment)  Discharge Needs  Concerns to be addressed:  Discharge Planning Concerns Readmission within the last 30 days:  No Current discharge risk:  None Barriers to Discharge:  Continued Medical Work up   [employer], LCSW 07/25/2016, 2:05 PM  [phone]

## 2016-07-26 ENCOUNTER — Inpatient Hospital Stay (HOSPITAL_COMMUNITY): Payer: Medicare Other

## 2016-07-26 DIAGNOSIS — R269 Unspecified abnormalities of gait and mobility: Secondary | ICD-10-CM

## 2016-07-26 DIAGNOSIS — I360 Nonrheumatic tricuspid (valve) stenosis: Secondary | ICD-10-CM

## 2016-07-26 LAB — ECHOCARDIOGRAM COMPLETE
HEIGHTINCHES: 65 in
Weight: 2825.42 oz

## 2016-07-26 LAB — CREATININE, SERUM
Creatinine, Ser: 0.42 mg/dL — ABNORMAL LOW (ref 0.44–1.00)
GFR calc Af Amer: >60 mL/min (ref >60)

## 2016-07-26 NOTE — Progress Notes (Signed)
  Echocardiogram 2D Echocardiogram has been performed.  Darlina Sicilian M 07/26/2016, 2:57 PM

## 2016-07-26 NOTE — Evaluation (Signed)
Physical Therapy Evaluation Patient Details Name: Tracy Sharp MRN: [id number] DOB: 01-Apr-1924 Today's Date: 07/26/2016   History of Present Illness  Pt admitted from [facility] memory unit 2* AMS and generalized weakness.  Pt with hx of RA, OA, CAD, peripheral neuropathy, Bil TKR, and recent UTI  Clinical Impression  Pt admitted as above and presenting with functional mobility limitations 2* generalized weakness and balance deficits.  Pt would benefit from follow up rehab at SNF level to maximize IND and safety.    Follow Up Recommendations SNF    Equipment Recommendations  None recommended by PT    Recommendations for Other Services OT consult     Precautions / Restrictions Precautions Precautions: Fall Precaution Comments: History of falls at home Restrictions Weight Bearing Restrictions: No      Mobility  Bed Mobility Overal bed mobility: Needs Assistance Bed Mobility: Supine to Sit     Supine to sit: Mod assist     General bed mobility comments: cues for sequence; use of bed rail and mod assist to complete transition to sitting  Transfers Overall transfer level: Needs assistance Equipment used: Rolling walker (2 wheeled) Transfers: Sit to/from [facility] Sit to Stand: Mod assist;+2 physical assistance;+2 safety/equipment;From elevated surface Stand pivot transfers: Mod assist;+2 physical assistance;+2 safety/equipment;From elevated surface       General transfer comment: cues for LE management and use of UEs to self assist.  Physical assist to bring wt up and fwd and to maintain balance once standing   Ambulation/Gait Ambulation/Gait assistance: Mod assist;+2 physical assistance;+2 safety/equipment Ambulation Distance (Feet): 2 Feet Assistive device: Rolling walker (2 wheeled) Gait Pattern/deviations: Step-to pattern;Decreased step length - right;Decreased step length - left;Shuffle;Trunk flexed Gait velocity: decr   General Gait  Details: Cues for posture and position from RW.  Pt with noted buckling at knees, scissoring of LEs and ltd awareness of foot placement  Stairs            Wheelchair Mobility    Modified Rankin (Stroke Patients Only)       Balance Overall balance assessment: Needs assistance Sitting-balance support: No upper extremity supported;Feet supported Sitting balance-Leahy Scale: Good     Standing balance support: Bilateral upper extremity supported Standing balance-Leahy Scale: Poor                               Pertinent Vitals/Pain Pain Assessment: No/denies pain    Home Living Family/patient expects to be discharged to:: Skilled nursing facility                      Prior Function Level of [place]: Needs assistance   Gait / Transfers Assistance Needed: Pt utilizes RW but in last 3 weeks has been largely using W/C 2* weakness and risk of falling  ADL's / Homemaking Assistance Needed: Assist for all tasks over last 3 weeks        Hand Dominance   Dominant Hand: Right    Extremity/Trunk Assessment   Upper Extremity Assessment Upper Extremity Assessment: Generalized weakness;RUE deficits/detail;LUE deficits/detail RUE Sensation: history of peripheral neuropathy LUE Sensation: history of peripheral neuropathy    Lower Extremity Assessment Lower Extremity Assessment: Generalized weakness    Cervical / Trunk Assessment Cervical / Trunk Assessment: Kyphotic  Communication   Communication: HOH  Cognition Arousal/Alertness: Awake/alert Behavior During Therapy: Anxious Overall Cognitive Status: History of cognitive impairments - at baseline  General Comments      Exercises     Assessment/Plan    PT Assessment Patient needs continued PT services  PT Problem List Decreased strength;Decreased activity tolerance;Decreased balance;Decreased mobility;Decreased knowledge of use of  DME;Decreased cognition;Obesity       PT Treatment Interventions DME instruction;Gait training;Functional mobility training;Therapeutic activities;Therapeutic exercise;Patient/family education;Balance training    PT Goals (Current goals can be found in the Care Plan section)  Acute Rehab PT Goals Patient Stated Goal: I just want to walk again PT Goal Formulation: With patient Time For Goal Achievement: 08/06/16 Potential to Achieve Goals: Fair    Frequency Min 3X/week   Barriers to discharge        Co-evaluation               AM-PAC PT "6 Clicks" Daily Activity  Outcome Measure Difficulty turning over in bed (including adjusting bedclothes, sheets and blankets)?: Total Difficulty moving from lying on back to sitting on the side of the bed? : Total Difficulty sitting down on and standing up from a chair with arms (e.g., wheelchair, bedside commode, etc,.)?: Total Help needed moving to and from a bed to chair (including a wheelchair)?: A Lot Help needed walking in hospital room?: Total Help needed climbing 3-5 steps with a railing? : Total 6 Click Score: 7    End of Session Equipment Utilized During Treatment: Gait belt Activity Tolerance: Patient limited by fatigue Patient left: in chair;with call bell/phone within reach;with family/visitor present Nurse Communication: Mobility status;Need for lift equipment PT Visit Diagnosis: Unsteadiness on feet (R26.81);Repeated falls (R29.6);Muscle weakness (generalized) (M62.81);Difficulty in walking, not elsewhere classified (R26.2)    Time: 9629-5284 PT Time Calculation (min) (ACUTE ONLY): 19 min   Charges:   PT Evaluation $PT Eval Moderate Complexity: 1 Procedure     PT G Codes:        Pg [phone]   Alwaleed Obeso 07/26/2016, 12:04 PM

## 2016-07-26 NOTE — Progress Notes (Addendum)
PROGRESS NOTE  Tracy Sharp  EXN:[id number] DOB: 05-12-1924 DOA: 07/24/2016 PCP: Harlan Stains, MD   Brief Narrative: Tracy Sharp is an 83 y.o. female with a history of RA, depression, anxiety, chronic pain and memory impairment who presented from memory care for decline in functional status. Her daughter-in-law assists with history, statin she has noticed about 1 month of gradually worsening memory, decreased appetite, increasing sleep, social withdrawal, and over the past week this has developed to include imbalance. This was in the wake of recent C. diff infection and UTI, seems like she hasn't bounced back. The patient noted mild coughing without fever and improvement in urinary symptoms with keflex prescribed 6/11.   On arrival, she was afebrile, saturating adequately on room air. CXR remonstrated early RLL infiltrate. No leukocytosis. Blood cultures were drawn, NS administered, and broad spectrum antibiotics started for HCAP. She continues to be diffusely weak, so PT evaluation is requested for disposition. Vancomycin was stopped due to negative MRSA PCR, and treatment is ongoing with cefepime.   Assessment & Plan: Principal Problem:   HCAP (healthcare-associated pneumonia) Active Problems:   Depression with anxiety   Essential hypertension   GAIT DISTURBANCE   Chronic leg pain   General weakness   Rheumatoid arthritis ([place])   Spinal stenosis of lumbar region with neurogenic claudication   Anemia   Acute encephalopathy   RLL pneumonia ([facility])   Weakness  Acute encephalopathy: Decline x1 month punctuated over the past few days, suspect due to infection. CT head shows stable microvascular disease without stroke. TSH normal at 0.732, RPR and HIV non-reactive, B12 normal at 356.  - Continues despite treatment per family. - Treat pneumonia as below - Continue treating mood disorders, consider psychiatry referral for possible pseudodementia.  - Delirium precautions - Minimize sedating  and anticholinergic medications  Right lower lobar pneumonia: Treating as HCAP due institutionalization. No leukocytosis, hypoxia, or fever, though pt is persistently tachypneic.   - Continue cefepime - Monitor sputum and blood cultures  Asymptomatic pyuria:  - Treating PNA as above - Urine culture ordered.   Depression, anxiety:   - Continue home medications: buspar, wellbutrin, cymbalta, risperdal.   Systolic murmur: No echocardiogram on file, also has trace pitting LE edema.  - Check echocardiogram.   Rheumatoid arthritis, chronic pain: Recently attempted wean from pain medications unsuccessfully.  - Continue home-regimen: MTX, oxyIR prn.   Generalized weakness/fatigue:  - PT consulted, recommending SNF.  DVT prophylaxis: Lovenox Code Status: Full Family Communication: Discussed with DIL 6/25 Disposition Plan: SNF pending clinical improvement  Consultants:   None  Procedures:   None  Antimicrobials:  Vancomycin 6/24 - 6/25  Zosyn 6/24  Cefepime 6/24 >>    Subjective: Pt feels weak, still confused.   Objective: Vitals:   07/25/16 1405 07/25/16 2112 07/26/16 0559 07/26/16 1338  BP: (!) 122/46 (!) 124/53 108/73 (!) 155/75  Pulse: 81 83 91 79  Resp: 20 18 20 20   Temp: 98.8 F (37.1 C) 98.5 F (36.9 C) 97.9 F (36.6 C) 97.8 F (36.6 C)  TempSrc: Oral Oral Oral Oral  SpO2: 100% 98% 99% 99%  Weight:      Height:        Intake/Output Summary (Last 24 hours) at 07/26/16 1429 Last data filed at 07/26/16 1338  Gross per 24 hour  Intake              890 ml  Output  0 ml  Net              890 ml   Filed Weights   07/24/16 1631 07/24/16 2049  Weight: 79.4 kg (175 lb) 80.1 kg (176 lb 9.4 oz)    Examination: General exam: Elderly, frail-appearing female in no distress Respiratory system: Tachypneic, more labored with ambulation with PT. Clear to auscultation bilaterally.  Cardiovascular system: Regular rate and rhythm. III/VI systolic  murmur at RUSB, no rub, or gallop. No JVD, and trace pitting pedal edema. Gastrointestinal system: Abdomen soft, non-tender, non-distended, with normoactive bowel sounds. No organomegaly or masses felt. Central nervous system: Drowsy, oriented to person and "hospital." Diffusely weak, though no focal neurological deficits. Extremities: Warm, no deformities Skin: No rashes, lesions no ulcers Psychiatry: Judgement and insight appear fair. Mood & affect appropriate.   Data Reviewed: I have personally reviewed following labs and imaging studies  CBC:  Recent Labs Lab 07/24/16 1641 07/25/16 0432  WBC 10.3 7.9  NEUTROABS 7.8* 5.8  HGB 11.3* 10.2*  HCT 34.7* 32.0*  MCV 89.2 90.7  PLT 398 702   Basic Metabolic Panel:  Recent Labs Lab 07/24/16 1641 07/25/16 0432 07/26/16 0341  NA 137  --   --   K 3.8  --   --   CL 104  --   --   CO2 25  --   --   GLUCOSE 97  --   --   BUN 12  --   --   CREATININE 0.55 0.45 0.42*  CALCIUM 8.8*  --   --    GFR: Estimated Creatinine Clearance: 49.8 mL/min (A) (by C-G formula based on SCr of 0.42 mg/dL (L)). Liver Function Tests:  Recent Labs Lab 07/24/16 1641  AST 17  ALT 14  ALKPHOS 171*  BILITOT 1.0  PROT 6.5  ALBUMIN 3.0*   No results for input(s): LIPASE, AMYLASE in the last 168 hours.  Recent Labs Lab 07/24/16 2115  AMMONIA 16   Coagulation Profile: No results for input(s): INR, PROTIME in the last 168 hours. Cardiac Enzymes: No results for input(s): CKTOTAL, CKMB, CKMBINDEX, TROPONINI in the last 168 hours. BNP (last 3 results) No results for input(s): PROBNP in the last 8760 hours. HbA1C: No results for input(s): HGBA1C in the last 72 hours. CBG:  Recent Labs Lab 07/25/16 0523  GLUCAP 106*   Lipid Profile: No results for input(s): CHOL, HDL, LDLCALC, TRIG, CHOLHDL, LDLDIRECT in the last 72 hours. Thyroid Function Tests:  Recent Labs  07/24/16 2115  TSH 0.732   Anemia Panel:  Recent Labs  07/24/16 2115    VITAMINB12 356   Urine analysis:    Component Value Date/Time   COLORURINE YELLOW 07/25/2016 2225   APPEARANCEUR CLOUDY (A) 07/25/2016 2225   LABSPEC 1.013 07/25/2016 2225   PHURINE 6.0 07/25/2016 2225   GLUCOSEU NEGATIVE 07/25/2016 2225   HGBUR NEGATIVE 07/25/2016 2225   HGBUR trace-intact 10/02/2008 1200   BILIRUBINUR NEGATIVE 07/25/2016 2225   KETONESUR NEGATIVE 07/25/2016 2225   PROTEINUR NEGATIVE 07/25/2016 2225   UROBILINOGEN 1.0 04/28/2014 1607   NITRITE NEGATIVE 07/25/2016 2225   LEUKOCYTESUR SMALL (A) 07/25/2016 2225   Recent Results (from the past 240 hour(s))  MRSA PCR Screening     Status: None   Collection Time: 07/24/16  9:43 PM  Result Value Ref Range Status   MRSA by PCR NEGATIVE NEGATIVE Final    Comment:        The GeneXpert MRSA Assay (FDA approved for NASAL  specimens only), is one component of a comprehensive MRSA colonization surveillance program. It is not intended to diagnose MRSA infection nor to guide or monitor treatment for MRSA infections.       Radiology Studies: Dg Chest 2 View  Result Date: 07/24/2016 CLINICAL DATA:  Altered mental status, weakness EXAM: CHEST  2 VIEW COMPARISON:  07/11/2016 FINDINGS: Cardiac shadow is mildly enlarged but stable. Aortic calcifications are again seen. The lungs are well aerated bilaterally. Patchy changes are noted in right lower lobe which may represent some very early infiltrate. No sizable effusion is seen. No bony abnormality is noted. IMPRESSION: Early right lower lobe infiltrate. Electronically Signed   By: Inez Catalina M.D.   On: 07/24/2016 17:20   Ct Head Wo Contrast  Result Date: 07/24/2016 CLINICAL DATA:  AMS which the staff reports worsened over the coarse of taking Amoxicillin. Per ems patient was alert to self in route, able to verify that she lives in a nursing facility but can't remember the name, continously asked why am I going to the hospital my legs always hurt EXAM: CT HEAD WITHOUT  CONTRAST TECHNIQUE: Contiguous axial images were obtained from the base of the skull through the vertex without intravenous contrast. COMPARISON:  02/15/2016 FINDINGS: Brain: No evidence of acute infarction, hemorrhage, hydrocephalus, extra-axial collection or mass lesion/mass effect. There is ventricular, and a lesser degree of sulcal, enlargement reflecting moderate atrophy, stable. Periventricular white matter hypoattenuation is also noted consistent with mild chronic microvascular ischemic change. Vascular: No hyperdense vessel or unexpected calcification. Skull: Normal. Negative for fracture or focal lesion. Sinuses/Orbits: Globes and orbits are unremarkable. Visualized sinuses and mastoid air cells are clear. Other: None. IMPRESSION: 1. No acute intracranial abnormalities. 2. Atrophy and chronic microvascular ischemic change. Stable appearance from the prior study. Electronically Signed   By: Lajean Manes M.D.   On: 07/24/2016 20:20    Scheduled Meds: . aspirin EC  81 mg Oral Daily  . buPROPion  300 mg Oral Daily  . busPIRone  15 mg Oral BID  . calcitonin (salmon)  1 spray Alternating Nares Daily  . cholecalciferol  2,000 Units Oral Daily  . cholestyramine  4 g Oral Q lunch  . DULoxetine  120 mg Oral Daily  . DULoxetine  30 mg Oral QPM  . enoxaparin (LOVENOX) injection  40 mg Subcutaneous Q24H  . feeding supplement (ENSURE ENLIVE)  237 mL Oral BID BM  . folic acid  1 mg Oral Daily  . gabapentin  100 mg Oral BID   And  . gabapentin  200 mg Oral QHS  . mirabegron ER  50 mg Oral Daily  . multivitamin  1 tablet Oral Daily  . risperiDONE  0.25 mg Oral QHS   Continuous Infusions: . ceFEPime (MAXIPIME) IV Stopped (07/25/16 2229)     LOS: 1 day   Time spent: 25 minutes.  Vance Gather, MD [facility] Pager [phone]  If 7PM-7AM, please contact night-coverage [email or web address] Password [facility] 07/26/2016, 2:29 PM

## 2016-07-27 DIAGNOSIS — F418 Other specified anxiety disorders: Secondary | ICD-10-CM

## 2016-07-27 DIAGNOSIS — I350 Nonrheumatic aortic (valve) stenosis: Secondary | ICD-10-CM

## 2016-07-27 DIAGNOSIS — I10 Essential (primary) hypertension: Secondary | ICD-10-CM

## 2016-07-27 LAB — COMPREHENSIVE METABOLIC PANEL
ALK PHOS: 156 U/L — AB (ref 38–126)
ALT: 11 U/L — AB (ref 14–54)
ANION GAP: 5 (ref 5–15)
AST: 12 U/L — ABNORMAL LOW (ref 15–41)
Albumin: 2.7 g/dL — ABNORMAL LOW (ref 3.5–5.0)
BUN: 17 mg/dL (ref 6–20)
CALCIUM: 8.8 mg/dL — AB (ref 8.9–10.3)
CHLORIDE: 105 mmol/L (ref 101–111)
CO2: 29 mmol/L (ref 22–32)
Creatinine, Ser: 0.45 mg/dL (ref 0.44–1.00)
Glucose, Bld: 113 mg/dL — ABNORMAL HIGH (ref 65–99)
Potassium: 4 mmol/L (ref 3.5–5.1)
Sodium: 139 mmol/L (ref 135–145)
Total Bilirubin: 0.4 mg/dL (ref 0.3–1.2)
Total Protein: 6 g/dL — ABNORMAL LOW (ref 6.5–8.1)

## 2016-07-27 LAB — CBC
HCT: 32.8 % — ABNORMAL LOW (ref 36.0–46.0)
HEMOGLOBIN: 10.3 g/dL — AB (ref 12.0–15.0)
MCH: 28.5 pg (ref 26.0–34.0)
MCHC: 31.4 g/dL (ref 30.0–36.0)
MCV: 90.6 fL (ref 78.0–100.0)
Platelets: 374 10*3/uL (ref 150–400)
RBC: 3.62 MIL/uL — AB (ref 3.87–5.11)
RDW: 14.1 % (ref 11.5–15.5)
WBC: 8.4 10*3/uL (ref 4.0–10.5)

## 2016-07-27 LAB — FOLATE RBC
FOLATE, HEMOLYSATE: 552.3 ng/mL
FOLATE, RBC: 1710 ng/mL (ref >498)
Hematocrit: 32.3 % — ABNORMAL LOW (ref 34.0–46.6)

## 2016-07-27 MED ORDER — CEFUROXIME AXETIL 500 MG PO TABS
500.0000 mg | ORAL_TABLET | Freq: Two times a day (BID) | ORAL | Status: DC
  Administered 2016-07-27 – 2016-07-28 (×2): 500 mg via ORAL
  Filled 2016-07-27 (×2): qty 1

## 2016-07-27 NOTE — Progress Notes (Addendum)
PROGRESS NOTE  Tracy Sharp  VZC:[id number] DOB: March 02, 1924 DOA: 07/24/2016 PCP: Harlan Stains, MD   Brief Narrative: Tracy Sharp is an 83 y.o. female with a history of RA, depression, anxiety, chronic pain and memory impairment who presented from memory care for decline in functional status. Her daughter-in-law assists with history, statin she has noticed about 1 month of gradually worsening memory, decreased appetite, increasing sleep, social withdrawal, and over the past week this has developed to include imbalance. This was in the wake of recent C. diff infection and UTI, seems like she hasn't bounced back. The patient noted mild coughing without fever and improvement in urinary symptoms with keflex prescribed 6/11.   On arrival, she was afebrile, saturating adequately on room air. CXR remonstrated early RLL infiltrate. No leukocytosis. Blood cultures were drawn, NS administered, and broad spectrum antibiotics started for HCAP. She continues to be diffusely weak, so PT evaluation is requested for disposition. Vancomycin was stopped due to negative MRSA PCR, and treatment continued with cefepime, since transitioned to cefdinir 6/27.   Assessment & Plan: Principal Problem:   HCAP (healthcare-associated pneumonia) Active Problems:   Depression with anxiety   Essential hypertension   GAIT DISTURBANCE   Chronic leg pain   General weakness   Rheumatoid arthritis ([place])   Spinal stenosis of lumbar region with neurogenic claudication   Anemia   Acute encephalopathy   RLL pneumonia ([facility])   Weakness  Acute encephalopathy: Decline x1 month punctuated over the past few days, suspect due to infection. CT head shows stable microvascular disease without stroke. TSH normal at 0.732, RPR and HIV non-reactive, B12 normal at 356.  - Still more confused than baseline per DIL, but improved with treatments thus far, appetite picking up some. - Treat pneumonia as below - Continue treating mood disorders,  consider psychiatry referral for possible pseudodementia.  - Delirium precautions - Minimize sedating and anticholinergic medications  Right lower lobar pneumonia: Treating as HCAP due institutionalization. No leukocytosis, hypoxia, or fever, though pt is persistently tachypneic.   - Continue cefepime, transition to cefdinir - Monitor sputum and blood cultures (NGTD)  Asymptomatic pyuria:  - Treating PNA as above - Urine culture ordered.   Depression, anxiety:   - Continue home medications: buspar, wellbutrin, cymbalta, risperdal.   Moderate aortic stenosis: Not likely to be contributing to symptoms. Euvolemic.  - Monitor.   Rheumatoid arthritis, chronic pain: Recently attempted wean from pain medications unsuccessfully.  - Continue home-regimen: MTX, oxyIR prn.   Generalized weakness/fatigue:  - PT consulted, recommending SNF.  DVT prophylaxis: Lovenox Code Status: Full Family Communication: None at bedside Disposition Plan: Transition to po abx, DC to SNF 6/28 if remains stable.   Consultants:   None  Procedures:   None  Antimicrobials:  Vancomycin 6/24 - 6/25  Zosyn 6/24  Cefepime 6/24 - 6/27  Levaquin to complete 7 days.  Subjective: Only complaint is weakness. Cough is mild, no dyspnea at rest, but still more winded than usual when moving around. Says she's not eating much.  Objective: Vitals:   07/26/16 1338 07/26/16 2010 07/27/16 0540 07/27/16 1430  BP: (!) 155/75 126/73 116/73 125/71  Pulse: 79 87 71 87  Resp: 20 18 16 18   Temp: 97.8 F (36.6 C) 98 F (36.7 C) 97.9 F (36.6 C) 98.3 F (36.8 C)  TempSrc: Oral Oral Oral Oral  SpO2: 99% 98% 99% 97%  Weight:      Height:        Intake/Output Summary (Last  24 hours) at 07/27/16 1634 Last data filed at 07/27/16 1546  Gross per 24 hour  Intake             1020 ml  Output              100 ml  Net              920 ml   Filed Weights   07/24/16 1631 07/24/16 2049  Weight: 79.4 kg (175 lb)  80.1 kg (176 lb 9.4 oz)    Examination: General exam: Elderly, frail-appearing female in no distress sitting in chair HEENT: Edentulous Respiratory system: Tachypnea improving, still very dyspneic with minimal exertion (to chair, 2 steps). Clear to auscultation bilaterally.  Cardiovascular system: Regular rate and rhythm. III/VI systolic murmur at RUSB, no rub, or gallop. No JVD, and trace pitting pedal edema. Gastrointestinal system: Abdomen soft, non-tender, non-distended, with normoactive bowel sounds. No organomegaly or masses felt. Central nervous system: Alert, more conversant, still disoriented. Diffusely weak, though no focal neurological deficits. Extremities: Warm, no deformities Skin: No rashes, lesions no ulcers Psychiatry: Judgement and insight appear fair. Mood & affect appropriate.   Data Reviewed: I have personally reviewed following labs and imaging studies  CBC:  Recent Labs Lab 07/24/16 1641 07/24/16 2115 07/25/16 0432 07/27/16 0336  WBC 10.3  --  7.9 8.4  NEUTROABS 7.8*  --  5.8  --   HGB 11.3*  --  10.2* 10.3*  HCT 34.7* 32.3* 32.0* 32.8*  MCV 89.2  --  90.7 90.6  PLT 398  --  354 409   Basic Metabolic Panel:  Recent Labs Lab 07/24/16 1641 07/25/16 0432 07/26/16 0341 07/27/16 0336  NA 137  --   --  139  K 3.8  --   --  4.0  CL 104  --   --  105  CO2 25  --   --  29  GLUCOSE 97  --   --  113*  BUN 12  --   --  17  CREATININE 0.55 0.45 0.42* 0.45  CALCIUM 8.8*  --   --  8.8*   GFR: Estimated Creatinine Clearance: 49.8 mL/min (by C-G formula based on SCr of 0.45 mg/dL). Liver Function Tests:  Recent Labs Lab 07/24/16 1641 07/27/16 0336  AST 17 12*  ALT 14 11*  ALKPHOS 171* 156*  BILITOT 1.0 0.4  PROT 6.5 6.0*  ALBUMIN 3.0* 2.7*    Recent Labs Lab 07/24/16 2115  AMMONIA 16   Thyroid Function Tests:  Recent Labs  07/24/16 2115  TSH 0.732   Anemia Panel:  Recent Labs  07/24/16 2115  VITAMINB12 356   Urine analysis:      Component Value Date/Time   COLORURINE YELLOW 07/25/2016 2225   APPEARANCEUR CLOUDY (A) 07/25/2016 2225   LABSPEC 1.013 07/25/2016 2225   PHURINE 6.0 07/25/2016 2225   GLUCOSEU NEGATIVE 07/25/2016 2225   HGBUR NEGATIVE 07/25/2016 2225   HGBUR trace-intact 10/02/2008 1200   BILIRUBINUR NEGATIVE 07/25/2016 2225   KETONESUR NEGATIVE 07/25/2016 2225   PROTEINUR NEGATIVE 07/25/2016 2225   UROBILINOGEN 1.0 04/28/2014 1607   NITRITE NEGATIVE 07/25/2016 2225   LEUKOCYTESUR SMALL (A) 07/25/2016 2225   Recent Results (from the past 240 hour(s))  Blood culture (routine x 2)     Status: None (Preliminary result)   Collection Time: 07/24/16  6:43 PM  Result Value Ref Range Status   Specimen Description BLOOD RIGHT ARM  Final   Special Requests   Final  BOTTLES DRAWN AEROBIC AND ANAEROBIC Blood Culture adequate volume   Culture   Final    NO GROWTH 2 DAYS Performed at [facility], [place] [address], [place], [place] [address]    Report Status PENDING  Incomplete  Blood culture (routine x 2)     Status: None (Preliminary result)   Collection Time: 07/24/16  6:46 PM  Result Value Ref Range Status   Specimen Description BLOOD LEFT ANTECUBITAL  Final   Special Requests   Final    BOTTLES DRAWN AEROBIC AND ANAEROBIC Blood Culture adequate volume   Culture   Final    NO GROWTH 2 DAYS Performed at [facility], [place] [address], [place], [place] [address]    Report Status PENDING  Incomplete  MRSA PCR Screening     Status: None   Collection Time: 07/24/16  9:43 PM  Result Value Ref Range Status   MRSA by PCR NEGATIVE NEGATIVE Final    Comment:        The GeneXpert MRSA Assay (FDA approved for NASAL specimens only), is one component of a comprehensive MRSA colonization surveillance program. It is not intended to diagnose MRSA infection nor to guide or monitor treatment for MRSA infections.       Radiology Studies: No results found.  Scheduled Meds: . aspirin EC  81 mg  Oral Daily  . buPROPion  300 mg Oral Daily  . busPIRone  15 mg Oral BID  . calcitonin (salmon)  1 spray Alternating Nares Daily  . cholecalciferol  2,000 Units Oral Daily  . cholestyramine  4 g Oral Q lunch  . DULoxetine  120 mg Oral Daily  . DULoxetine  30 mg Oral QPM  . enoxaparin (LOVENOX) injection  40 mg Subcutaneous Q24H  . feeding supplement (ENSURE ENLIVE)  237 mL Oral BID BM  . folic acid  1 mg Oral Daily  . gabapentin  100 mg Oral BID   And  . gabapentin  200 mg Oral QHS  . mirabegron ER  50 mg Oral Daily  . multivitamin  1 tablet Oral Daily  . risperiDONE  0.25 mg Oral QHS   Continuous Infusions: . ceFEPime (MAXIPIME) IV Stopped (07/26/16 2115)     LOS: 2 days   Time spent: 25 minutes.  Vance Gather, MD [facility] Pager [phone]  If 7PM-7AM, please contact night-coverage [email or web address] Password [email or web address] 07/27/2016, 4:34 PM

## 2016-07-27 NOTE — [place] FL2 (Signed)
[place] MEDICAID FL2 LEVEL OF CARE SCREENING TOOL     IDENTIFICATION  Patient Name: Tracy Sharp Birthdate: 1924/06/02 Sex: female Admission Date (Current Location): 07/24/2016  [facility] and [place] Number:  Herbalist and Address:  [facility],  [place], [place]      Provider Number: [id number]  Attending Physician Name and Address:  Patrecia Pour, MD  Relative Name and Phone Number:       Current Level of Care: Hospital Recommended Level of Care: [place] Prior Approval Number:    Date Approved/Denied:   PASRR Number: [id number]  Discharge Plan: SNF    Current Diagnoses: Patient Active Problem List   Diagnosis Date Noted  . RLL pneumonia ([place]) 07/25/2016  . Weakness   . HCAP (healthcare-associated pneumonia) 07/24/2016  . Anemia 07/24/2016  . Acute encephalopathy 07/24/2016  . Chronic instability of right knee 03/10/2016  . Chronic postoperative pain 12/14/2015  . Spinal stenosis of lumbar region with neurogenic claudication 12/14/2015  . Malnutrition of moderate degree 07/22/2015  . UTI (lower urinary tract infection) 07/21/2015  . Fall 07/21/2015  . UTI (urinary tract infection) 07/21/2015  . Intractable pain 07/21/2015  . Physical deconditioning 07/21/2015  . Compression fracture of L1 lumbar vertebra ([place]) 07/21/2015  . Rheumatoid arthritis ([place]) 07/21/2015  . Bladder spasm 07/21/2015  . Peripheral neuropathy 07/21/2015  . Insomnia 07/21/2015  . Elevated sedimentation rate 09/24/2014  . Chronic pain of right knee 09/24/2014  . Bilateral lower extremity edema 09/24/2014  . Rheumatoid arthritis flare ([place]) 09/24/2014  . GERD (gastroesophageal reflux disease) 09/17/2014  . Recurrent falls 09/17/2014  . Seronegative arthritis 09/17/2014  . General weakness   . Chronic leg pain 09/16/2014  . Compression fracture   . Varicose veins 07/11/2012  . Osteoporosis 12/09/2009  . Obesity 07/07/2008  .  SHOULDER PAIN, BILATERAL 05/01/2008  . GAIT DISTURBANCE 01/09/2007  . C A D 01/08/2007  . HLD (hyperlipidemia) 11/03/2006  . Depression with anxiety 11/03/2006  . Essential hypertension 11/03/2006  . GASTRITIS 11/03/2006  . Osteoarthritis 11/03/2006  . SYMPTOM, INCONTINENCE, MIXED, URGE/STRESS 11/03/2006    Orientation RESPIRATION BLADDER Height & Weight     Self, Situation, Place  Normal Continent Weight: 176 lb 9.4 oz (80.1 kg) Height:  5\' 5"  (165.1 cm)  BEHAVIORAL SYMPTOMS/MOOD NEUROLOGICAL BOWEL NUTRITION STATUS      Continent Diet (regular, thin fluid consistency)  AMBULATORY STATUS COMMUNICATION OF NEEDS Skin   Extensive Assist Verbally Normal                       Personal Care Assistance Level of Assistance  Bathing, Feeding, Dressing Bathing Assistance: Maximum assistance Feeding assistance: Limited assistance Dressing Assistance: Maximum assistance     Functional Limitations Info  Sight, Hearing, Speech Sight Info: Adequate Hearing Info: Adequate Speech Info: Adequate    SPECIAL CARE FACTORS FREQUENCY  PT (By licensed PT), OT (By licensed OT)     PT Frequency: 5x OT Frequency: 5x            Contractures Contractures Info: Not present    Additional Factors Info  Code Status, Allergies Code Status Info: full Allergies Info: Doxycycline, Hydrocodone, Norco Hydrocodone-acetaminophen, Toviaz Fesoterodine Fumarate Er, Plaquenil Hydroxychloroquine           Current Medications (07/27/2016):  This is the current hospital active medication list Current Facility-Administered Medications  Medication Dose Route Frequency Provider Last Rate Last Dose  . acetaminophen (TYLENOL) tablet  650 mg  650 mg Oral Q6H PRN Opyd, Ilene Qua, MD      . aspirin EC tablet 81 mg  81 mg Oral Daily Opyd, Ilene Qua, MD   81 mg at 07/26/16 1059  . buPROPion (WELLBUTRIN XL) 24 hr tablet 300 mg  300 mg Oral Daily Opyd, Ilene Qua, MD   300 mg at 07/26/16 1059  . busPIRone  (BUSPAR) tablet 15 mg  15 mg Oral BID Vianne Bulls, MD   15 mg at 07/26/16 2203  . calcitonin (salmon) (MIACALCIN/FORTICAL) nasal spray 1 spray  1 spray Alternating Nares Daily Opyd, Ilene Qua, MD   1 spray at 07/26/16 1100  . ceFEPIme (MAXIPIME) 2 g in dextrose 5 % 50 mL IVPB  2 g Intravenous Q24H Opyd, Ilene Qua, MD   Stopped at 07/26/16 2115  . cholecalciferol (VITAMIN D) tablet 2,000 Units  2,000 Units Oral Daily Opyd, Ilene Qua, MD   2,000 Units at 07/26/16 1059  . cholestyramine (QUESTRAN) packet 4 g  4 g Oral Q lunch Patrecia Pour, MD   4 g at 07/26/16 1253  . diclofenac sodium (VOLTAREN) 1 % transdermal gel 4 g  4 g Topical QID PRN Opyd, Ilene Qua, MD      . docusate sodium (COLACE) capsule 100 mg  100 mg Oral Daily PRN Opyd, Ilene Qua, MD      . DULoxetine (CYMBALTA) DR capsule 120 mg  120 mg Oral Daily Opyd, Ilene Qua, MD   120 mg at 07/26/16 1059  . DULoxetine (CYMBALTA) DR capsule 30 mg  30 mg Oral QPM Opyd, Ilene Qua, MD   30 mg at 07/26/16 1741  . enoxaparin (LOVENOX) injection 40 mg  40 mg Subcutaneous Q24H Opyd, Ilene Qua, MD   40 mg at 07/25/16 2203  . feeding supplement (ENSURE ENLIVE) (ENSURE ENLIVE) liquid 237 mL  237 mL Oral BID BM Opyd, Ilene Qua, MD   237 mL at 07/26/16 1741  . folic acid (FOLVITE) tablet 1 mg  1 mg Oral Daily Opyd, Ilene Qua, MD   1 mg at 07/26/16 1059  . gabapentin (NEURONTIN) capsule 100 mg  100 mg Oral BID Minda Ditto, RPH   100 mg at 07/26/16 1741   And  . gabapentin (NEURONTIN) capsule 200 mg  200 mg Oral QHS Minda Ditto, RPH   200 mg at 07/26/16 2204  . guaiFENesin (MUCINEX) 12 hr tablet 1,200 mg  1,200 mg Oral Daily PRN Opyd, Ilene Qua, MD      . hydrALAZINE (APRESOLINE) injection 10 mg  10 mg Intravenous Q4H PRN Opyd, Ilene Qua, MD      . hydrocerin (EUCERIN) cream 1 application  1 application Topical BID PRN Opyd, Ilene Qua, MD      . loratadine (CLARITIN) tablet 10 mg  10 mg Oral Daily PRN Opyd, Ilene Qua, MD      . mirabegron ER  (MYRBETRIQ) tablet 50 mg  50 mg Oral Daily Opyd, Ilene Qua, MD   50 mg at 07/26/16 1058  . multivitamin (PROSIGHT) tablet 1 tablet  1 tablet Oral Daily Opyd, Ilene Qua, MD   1 tablet at 07/26/16 1059  . oxyCODONE (Oxy IR/ROXICODONE) immediate release tablet 5 mg  5 mg Oral BID PRN Opyd, Ilene Qua, MD   5 mg at 07/26/16 1253  . risperiDONE (RISPERDAL) tablet 0.25 mg  0.25 mg Oral QHS Opyd, Ilene Qua, MD   0.25 mg at 07/26/16 2206     Discharge Medications:  Please see discharge summary for a list of discharge medications.  Relevant Imaging Results:  Relevant Lab Results:   Additional Information SS# [id number]. IV antibiotics through 08/01/16  Nila Nephew, LCSW

## 2016-07-27 NOTE — Progress Notes (Signed)
Followed up with pt's family (daughter in law and son) re: plans for DC. Pt residing in ALF and receiving PT there, however SNF recommended due to deconditioning per therapy eval, also IV antibiotics. Daughter-in-law states they would like to pursue SNF at this point. Discussed that they feel long term plan will be to transition pt to SNF for custodial care as well, however they are not prepared financially for that transition currently (in early stages of pursuing Medicaid application). States tentative plan would be to return to ALF once completed rehab and IV antibiotics.  Per chart review, pt inpatient status began 07/25/16. Discussed with family MCR requirement for 3 night inpatient stay needed to qualify for SNF.  Completed FL2 and referred to area facilities. Will follow up with bed offers.   Sharren Bridge, MSW, LCSW Clinical Social Work 07/27/2016 [phone]

## 2016-07-28 ENCOUNTER — Non-Acute Institutional Stay (SKILLED_NURSING_FACILITY): Payer: Medicare Other | Admitting: Adult Health

## 2016-07-28 ENCOUNTER — Encounter: Payer: Self-pay | Admitting: Adult Health

## 2016-07-28 DIAGNOSIS — G609 Hereditary and idiopathic neuropathy, unspecified: Secondary | ICD-10-CM

## 2016-07-28 DIAGNOSIS — R4182 Altered mental status, unspecified: Secondary | ICD-10-CM | POA: Diagnosis not present

## 2016-07-28 DIAGNOSIS — D649 Anemia, unspecified: Secondary | ICD-10-CM | POA: Diagnosis not present

## 2016-07-28 DIAGNOSIS — J189 Pneumonia, unspecified organism: Secondary | ICD-10-CM | POA: Diagnosis not present

## 2016-07-28 DIAGNOSIS — M069 Rheumatoid arthritis, unspecified: Secondary | ICD-10-CM

## 2016-07-28 DIAGNOSIS — N3281 Overactive bladder: Secondary | ICD-10-CM | POA: Diagnosis not present

## 2016-07-28 DIAGNOSIS — I10 Essential (primary) hypertension: Secondary | ICD-10-CM

## 2016-07-28 DIAGNOSIS — G8929 Other chronic pain: Secondary | ICD-10-CM | POA: Diagnosis not present

## 2016-07-28 DIAGNOSIS — I350 Nonrheumatic aortic (valve) stenosis: Secondary | ICD-10-CM | POA: Diagnosis not present

## 2016-07-28 DIAGNOSIS — H353221 Exudative age-related macular degeneration, left eye, with active choroidal neovascularization: Secondary | ICD-10-CM | POA: Diagnosis not present

## 2016-07-28 DIAGNOSIS — R2689 Other abnormalities of gait and mobility: Secondary | ICD-10-CM | POA: Diagnosis not present

## 2016-07-28 DIAGNOSIS — F419 Anxiety disorder, unspecified: Secondary | ICD-10-CM | POA: Diagnosis not present

## 2016-07-28 DIAGNOSIS — G9340 Encephalopathy, unspecified: Secondary | ICD-10-CM | POA: Diagnosis not present

## 2016-07-28 DIAGNOSIS — G9009 Other idiopathic peripheral autonomic neuropathy: Secondary | ICD-10-CM | POA: Diagnosis not present

## 2016-07-28 DIAGNOSIS — G8928 Other chronic postprocedural pain: Secondary | ICD-10-CM

## 2016-07-28 DIAGNOSIS — M48062 Spinal stenosis, lumbar region with neurogenic claudication: Secondary | ICD-10-CM | POA: Diagnosis not present

## 2016-07-28 DIAGNOSIS — E785 Hyperlipidemia, unspecified: Secondary | ICD-10-CM | POA: Diagnosis not present

## 2016-07-28 DIAGNOSIS — F329 Major depressive disorder, single episode, unspecified: Secondary | ICD-10-CM | POA: Diagnosis not present

## 2016-07-28 DIAGNOSIS — R531 Weakness: Secondary | ICD-10-CM | POA: Diagnosis not present

## 2016-07-28 DIAGNOSIS — F4320 Adjustment disorder, unspecified: Secondary | ICD-10-CM | POA: Diagnosis not present

## 2016-07-28 DIAGNOSIS — R0902 Hypoxemia: Secondary | ICD-10-CM

## 2016-07-28 DIAGNOSIS — M79605 Pain in left leg: Secondary | ICD-10-CM | POA: Diagnosis not present

## 2016-07-28 DIAGNOSIS — S20211A Contusion of right front wall of thorax, initial encounter: Secondary | ICD-10-CM | POA: Diagnosis not present

## 2016-07-28 DIAGNOSIS — F29 Unspecified psychosis not due to a substance or known physiological condition: Secondary | ICD-10-CM | POA: Diagnosis not present

## 2016-07-28 DIAGNOSIS — F4323 Adjustment disorder with mixed anxiety and depressed mood: Secondary | ICD-10-CM | POA: Diagnosis not present

## 2016-07-28 DIAGNOSIS — N390 Urinary tract infection, site not specified: Secondary | ICD-10-CM | POA: Diagnosis not present

## 2016-07-28 DIAGNOSIS — M810 Age-related osteoporosis without current pathological fracture: Secondary | ICD-10-CM | POA: Diagnosis not present

## 2016-07-28 DIAGNOSIS — M79606 Pain in leg, unspecified: Secondary | ICD-10-CM | POA: Diagnosis not present

## 2016-07-28 DIAGNOSIS — R5381 Other malaise: Secondary | ICD-10-CM | POA: Diagnosis not present

## 2016-07-28 DIAGNOSIS — J181 Lobar pneumonia, unspecified organism: Secondary | ICD-10-CM | POA: Diagnosis not present

## 2016-07-28 DIAGNOSIS — M159 Polyosteoarthritis, unspecified: Secondary | ICD-10-CM | POA: Diagnosis not present

## 2016-07-28 DIAGNOSIS — M6281 Muscle weakness (generalized): Secondary | ICD-10-CM | POA: Diagnosis not present

## 2016-07-28 DIAGNOSIS — H353112 Nonexudative age-related macular degeneration, right eye, intermediate dry stage: Secondary | ICD-10-CM | POA: Diagnosis not present

## 2016-07-28 DIAGNOSIS — G894 Chronic pain syndrome: Secondary | ICD-10-CM | POA: Diagnosis not present

## 2016-07-28 DIAGNOSIS — F418 Other specified anxiety disorders: Secondary | ICD-10-CM | POA: Diagnosis not present

## 2016-07-28 LAB — CREATININE, SERUM
Creatinine, Ser: 0.59 mg/dL (ref 0.44–1.00)
GFR calc Af Amer: >60 mL/min (ref >60)
GFR calc non Af Amer: >60 mL/min (ref >60)

## 2016-07-28 MED ORDER — ACETAMINOPHEN 325 MG PO TABS: 650.0000 mg | ORAL_TABLET | Freq: Four times a day (QID) | ORAL | Status: DC | PRN

## 2016-07-28 MED ORDER — CEFUROXIME AXETIL 500 MG PO TABS: 500.0000 mg | ORAL_TABLET | Freq: Two times a day (BID) | ORAL | 0 refills | Status: AC

## 2016-07-28 MED ORDER — OXYCODONE HCL 5 MG PO TABS: 5.0000 mg | ORAL_TABLET | Freq: Two times a day (BID) | ORAL | 0 refills | Status: DC | PRN
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
## 2016-07-28 NOTE — Clinical Social Work Placement (Signed)
Pt's family has selected [facility], room 117B. Pt will transfer there at DC today. Family informed- daughter-in-law [name]. Pt's ALF [facility]) also updated on DC plan. Pt will transport via [facility] completed medical necessity form and arranged transportation.  All information provided to facility via the [facility]. Report [phone]  See below for placement details   CLINICAL SOCIAL WORK PLACEMENT  NOTE  Date:  07/28/2016  Patient Details  Name: Tracy Sharp MRN: [id number] Date of Birth: 1924-11-17  Clinical Social Work is seeking post-discharge placement for this patient at the   level of care (*CSW will initial, date and re-position this form in  chart as items are completed):  Yes   Patient/family provided with [facility] Work Department's list of facilities offering this level of care within the geographic area requested by the patient (or if unable, by the patient's family).  Yes   Patient/family informed of their freedom to choose among providers that offer the needed level of care, that participate in Medicare, Medicaid or managed care program needed by the patient, have an available bed and are willing to accept the patient.      Patient/family informed of [facility]'s ownership interest in [facility] and [facility], as well as of the fact that they are under no obligation to receive care at these facilities.  PASRR submitted to EDS on       PASRR number received on       Existing PASRR number confirmed on 07/27/16     FL2 transmitted to all facilities in geographic area requested by pt/family on 07/27/16     FL2 transmitted to all facilities within larger geographic area on       Patient informed that his/her managed care company has contracts with or will negotiate with certain facilities, including the following:        Yes   Patient/family informed of bed offers received.  Patient chooses bed at [facility]      Physician recommends and patient chooses bed at [facility]    Patient to be transferred to [facility] on 07/28/16.  Patient to be transferred to facility by       Patient family notified on 07/28/16 of transfer.  Name of family member notified:  daughter-in-law [name]     PHYSICIAN       Additional Comment:    _______________________________________________ Nila Nephew, LCSW 07/28/2016, 12:28 PM

## 2016-07-28 NOTE — Discharge Summary (Addendum)
Physician Discharge Summary  Tracy Sharp DDU:[id number] DOB: 1924/03/24 DOA: 07/24/2016  PCP: Harlan Stains, MD  Admit date: 07/24/2016 Discharge date: 07/28/2016  Admitted From: Memory Care Disposition: SNF   Recommendations for Outpatient Follow-up:  1. Follow up with PCP in 1-2 weeks after SNF discharge 2. Continue cefdinir 500mg  BID for 6 more days. Follow up final urine culture data (not available at discharge) 3. Consider neuropsychiatric evaluation for mood disorder and cognitive impairment.  Home Health: N/A Equipment/Devices: Per SNF Discharge Condition: Stable CODE STATUS: Full Diet recommendation: Regular as tolerated  Brief/Interim Summary: Tracy Sharp an 83 y.o.femalewith a history of RA, depression, anxiety, chronic pain and memory impairment who presented from memory care for decline in functional status. Her daughter-in-law assists with history, statin she has noticed about 1 month of gradually worsening memory, decreased appetite, increasing sleep, social withdrawal, and over Tracy past week this has developed to include imbalance. This was in Tracy wake of recent C. diff infection and UTI, seems like she hasn't bounced back. Tracy Sharp noted mild coughing without fever and improvement in urinary symptoms with keflex prescribed 6/11.   On arrival, she was afebrile, saturating adequately on room air. CXR remonstrated early RLL infiltrate. No leukocytosis. Blood cultures were drawn, NS administered, and broad spectrum antibiotics started for HCAP. She continued to be diffusely weak, so PT evaluation was requested for disposition and recommended SNF. Vancomycin was stopped due to negative MRSA PCR, and treatment continued with cefepime, since transitioned to cefdinir 6/27. She remains hemodynamically stable and will complete a course of antibiotics and continue physical therapy at SNF.  Discharge Diagnoses:  Principal Problem:   HCAP (healthcare-associated  pneumonia) Active Problems:   Depression with anxiety   Essential hypertension   GAIT DISTURBANCE   Chronic leg pain   General weakness   Rheumatoid arthritis ([place])   Spinal stenosis of lumbar region with neurogenic claudication   Anemia   Acute encephalopathy   RLL pneumonia (HCC)   Weakness   Moderate aortic stenosis  Acute toxic metabolic encephalopathy: Decline x1 month punctuated over Tracy past few days, suspect due to infection. CT head shows stable microvascular disease without stroke. TSH normal at 0.732, RPR and HIV non-reactive, B12 normal at 356.  - Acute mental status changes have resolved with treatment of pneumonia, though mental status remains at abnormal baseline, cognitive impairment noted.  - Continue treating mood disorders, consider psychiatry referral for possible pseudodementia.  - Delirium precautions - Minimize sedating and anticholinergic medications  Right lower lobar pneumonia: Treating as HCAP due institutionalization. No leukocytosis, hypoxia, or fever, though pt is persistently tachypneic.   - Continue cefdinir - Monitor sputum and blood cultures (NGTD at discharge)  Asymptomatic pyuria:  - Treating PNA as above - Urine culture sent but not resulted at time of discharge.   Depression, anxiety:   - Continue home medications: buspar, wellbutrin, cymbalta, risperdal.   Moderate aortic stenosis: Not likely to be contributing to symptoms. See full report below. Euvolemic.  - Monitor.   Moderate malnutrition: - High protein diet  Rheumatoid arthritis, chronic pain: Recently attempted wean from pain medications unsuccessfully.  - Continue home-regimen: MTX, diclofenac, oxyIR prn.   Generalized weakness/fatigue:  - PT consulted, recommending SNF.  Discharge Instructions  Allergies as of 07/28/2016      Reactions   Doxycycline Other (See Comments)   intolerance   Hydrocodone Other (See Comments)   intolerance   Norco  [hydrocodone-acetaminophen] Other (See Comments)   Nausea, dizziness  Toviaz [fesoterodine Fumarate Er] Other (See Comments)   dizzy   Plaquenil [hydroxychloroquine] Rash      Medication List    STOP taking these medications   cephALEXin 500 MG capsule Commonly known as:  KEFLEX     TAKE these medications   acetaminophen 325 MG tablet Commonly known as:  TYLENOL Take 2 tablets (650 mg total) by mouth every 6 (six) hours as needed for mild pain or headache. What changed:  medication strength  how much to take  when to take this  reasons to take this   ACIDOPHILUS PO Take 1 tablet by mouth daily.   antiseptic oral rinse Liqd 15 mLs by Mouth Rinse route every 12 (twelve) hours as needed for dry mouth.   aspirin EC 81 MG tablet Take 81 mg by mouth daily.   buPROPion 150 MG 24 hr tablet Commonly known as:  WELLBUTRIN XL Take 300 mg by mouth daily.   busPIRone 15 MG tablet Commonly known as:  BUSPAR Take 15 mg by mouth 2 (two) times daily.   calcitonin (salmon) 200 UNIT/ACT nasal spray Commonly known as:  MIACALCIN/FORTICAL Place 1 spray into alternate nostrils daily.   cefUROXime 500 MG tablet Commonly known as:  CEFTIN Take 1 tablet (500 mg total) by mouth 2 (two) times daily.   diclofenac sodium 1 % Gel Commonly known as:  VOLTAREN Apply 4 g topically 4 (four) times daily as needed (pain).   docusate sodium 100 MG capsule Commonly known as:  COLACE Take 100 mg by mouth daily as needed for mild constipation.   DULoxetine 30 MG capsule Commonly known as:  CYMBALTA Take 30 mg by mouth every evening.   DULoxetine 60 MG capsule Commonly known as:  CYMBALTA Take 120 mg by mouth every morning.   eucerin cream Apply 1 application topically 2 (two) times daily as needed for dry skin.   feeding supplement (ENSURE ENLIVE) Liqd Take 237 mLs by mouth 2 (two) times daily between meals.   folic acid 1 MG tablet Commonly known as:  FOLVITE Take 1 mg by mouth  daily.   gabapentin 100 MG capsule Commonly known as:  NEURONTIN Take 1 capsule (100 mg total) by mouth 3 (three) times daily. What changed:  additional instructions   guaiFENesin 600 MG 12 hr tablet Commonly known as:  MUCINEX Take 1,200 mg by mouth daily as needed for cough or to loosen phlegm.   HCA TRIPLE ANTIBIOTIC OINTMENT EX Apply 1 application topically every 6 (six) hours as needed. abrasions   loratadine 10 MG tablet Commonly known as:  CLARITIN Take 10 mg by mouth daily as needed (congestion).   Melatonin 5 MG Tabs Take 10 mg by mouth at bedtime.   methotrexate 2.5 MG tablet Commonly known as:  RHEUMATREX Take 10 mg by mouth See admin instructions. Take 10 mg ( 4 tablets ) once a week on wednesdays   multivitamin-lutein Caps capsule Take 1 capsule by mouth daily.   MYRBETRIQ 50 MG Tb24 tablet Generic drug:  mirabegron ER Take 50 mg by mouth daily.   NYSTOP EX Apply 1 application topically 2 (two) times daily as needed (skin irritation).   OVER Tracy COUNTER MEDICATION Take 2 sprays by mouth 2 (two) times daily as needed. "Biotene Mouth Spray" as needed for dry mouth   oxyCODONE 5 MG immediate release tablet Commonly known as:  Oxy IR/ROXICODONE Take 1 tablet (5 mg total) by mouth 2 (two) times daily as needed for severe pain.  PREVALITE 4 GM/DOSE powder Generic drug:  cholestyramine light Take 4 g by mouth daily.   risperiDONE 0.25 MG tablet Commonly known as:  RISPERDAL Take 0.25 mg by mouth at bedtime.   ROLAIDS PO Take 2 tablets by mouth every 12 (twelve) hours as needed. indigestion   Vitamin D 2000 units tablet Take 2,000 Units by mouth daily.      Follow-up Information    Harlan Stains, MD Follow up.   Specialty:  Family Medicine Contact information: [place] [address] [phone]          Allergies  Allergen Reactions  . Doxycycline Other (See Comments)    intolerance  . Hydrocodone Other  (See Comments)    intolerance  . Norco [Hydrocodone-Acetaminophen] Other (See Comments)    Nausea, dizziness  . Toviaz [Fesoterodine Fumarate Er] Other (See Comments)    dizzy  . Plaquenil [Hydroxychloroquine] Rash    Consultations:  None  Procedures/Studies: Dg Chest 2 View  Result Date: 07/24/2016 CLINICAL DATA:  Altered mental status, weakness EXAM: CHEST  2 VIEW COMPARISON:  07/11/2016 FINDINGS: Cardiac shadow is mildly enlarged but stable. Aortic calcifications are again seen. Tracy lungs are well aerated bilaterally. Patchy changes are noted in right lower lobe which may represent some very early infiltrate. No sizable effusion is seen. No bony abnormality is noted. IMPRESSION: Early right lower lobe infiltrate. Electronically Signed   By: Inez Catalina M.D.   On: 07/24/2016 17:20   Dg Chest 2 View  Result Date: 07/11/2016 CLINICAL DATA:  Bilateral leg pain for Tracy past 2 days.  Weakness. EXAM: CHEST  2 VIEW COMPARISON:  11/20/2015 FINDINGS: Stable cardiomegaly with aortic atherosclerosis. No acute pulmonary consolidation or pneumothorax. No effusion. Mild central vascular congestion. No acute nor suspicious osseous abnormalities. IMPRESSION: Stable cardiomegaly with aortic atherosclerosis. Mild central vascular congestion. Electronically Signed   By: Ashley Royalty M.D.   On: 07/11/2016 18:36   Dg Lumbar Spine Complete  Result Date: 07/11/2016 CLINICAL DATA:  Lower extremity pain and generalized weakness EXAM: LUMBAR SPINE - COMPLETE 4+ VIEW COMPARISON:  MRI 09/16/2014, CT 07/21/2015 FINDINGS: There is no evidence of acute lumbar spine fracture. Chronic stable mild compression of L1 is unchanged. Degenerative disc space narrowing L3 through S1. Surgical clips are seen in Tracy right lower quadrant. There is aortic atherosclerosis without aneurysm. Multilevel degenerative facet arthropathy and osteophyte formation off Tracy endplates. Osteoarthritis of Tracy SI joints. IMPRESSION: Chronic stable  mild L1 compression. Degenerative disc disease L3 through S1 with lumbar facet arthropathy. No acute osseous appearing abnormality. Electronically Signed   By: Ashley Royalty M.D.   On: 07/11/2016 18:39   Ct Head Wo Contrast  Result Date: 07/24/2016 CLINICAL DATA:  AMS which Tracy staff reports worsened over Tracy coarse of taking Amoxicillin. Per ems Sharp was alert to self in route, able to verify that she lives in a nursing facility but can't remember Tracy name, continously asked why am I going to [facility] hospital my legs always hurt EXAM: CT HEAD WITHOUT CONTRAST TECHNIQUE: Contiguous axial images were obtained from Tracy base of Tracy skull through Tracy vertex without intravenous contrast. COMPARISON:  02/15/2016 FINDINGS: Brain: No evidence of acute infarction, hemorrhage, hydrocephalus, extra-axial collection or mass lesion/mass effect. There is ventricular, and a lesser degree of sulcal, enlargement reflecting moderate atrophy, stable. Periventricular white matter hypoattenuation is also noted consistent with mild chronic microvascular ischemic change. Vascular: No hyperdense vessel or unexpected calcification. Skull: Normal. Negative for fracture or  focal lesion. Sinuses/Orbits: Globes and orbits are unremarkable. Visualized sinuses and mastoid air cells are clear. Other: None. IMPRESSION: 1. No acute intracranial abnormalities. 2. Atrophy and chronic microvascular ischemic change. Stable appearance from Tracy prior study. Electronically Signed   By: Lajean Manes M.D.   On: 07/24/2016 20:20   ECHOCARDIOGRAM  - Left ventricle: Tracy cavity size was normal. There was moderate   concentric hypertrophy. Systolic function was normal. Tracy   estimated ejection fraction was in Tracy range of 60% to 65%. Wall   motion was normal; there were no regional wall motion   abnormalities. Doppler parameters are consistent with abnormal   left ventricular relaxation (grade 1 diastolic dysfunction). Tracy   E/e&' ratio is between  8-15, suggesting indeterminate LV filling   pressure. - Aortic valve: Mildly calcified leaflets. Moderate stenosis. Mean   gradient (S): 20 mm Hg. Peak gradient (S): 33 mm Hg. Valve area   (VTI): 1.45 cm^2. Valve area (Vmax): 1.42 cm^2. - Mitral valve: Mildly thickened leaflets . There was mild   regurgitation. - Left atrium: Tracy atrium was at Tracy upper limits of normal in   size. - Right atrium: Tracy atrium was mildly dilated. - Tricuspid valve: There was moderate regurgitation. - Pulmonary arteries: PA peak pressure: 37 mm Hg (S). - Inferior vena cava: Tracy vessel was normal in size. Tracy   respirophasic diameter changes were in Tracy normal range (>= 50%),   consistent with normal central venous pressure.  Impressions:  - LVEF 60-65%, moderate LVH, normal wall motion, grade 1 DD,   indeterminate LV filling pressure, moderate aortic stenosis -   mean gradient 20 mmHg, AVA of around 1.40-1.5 cm2, mild MR, mild   RAE, moderate TR, RVSP 37 mmHg, normal IVC.  Subjective: Pt without dyspnea or chest pain. No fevers. Arthritis flaring in both knees, improved with cold compress and topical diclofenac.  Discharge Exam: Vitals:   07/27/16 2100 07/28/16 0500  BP: 135/89 127/77  Pulse: 82 74  Resp: 16 16  Temp: 98 F (36.7 C) 98.1 F (36.7 C)   General: Frail elderly female in no distress Cardiovascular: RRR, III/VI systolic murmur at RUSB radiating to carotids, no JVD, no edema Respiratory: CTA bilaterally, no wheezing, no rhonchi Abdominal: Soft, NT, ND, bowel sounds + Extremities: Enlarged knees bilaterally without erythema or warmth.   Labs: Basic Metabolic Panel:  Recent Labs Lab 07/24/16 1641 07/25/16 0432 07/26/16 0341 07/27/16 0336 07/28/16 0348  NA 137  --   --  139  --   K 3.8  --   --  4.0  --   CL 104  --   --  105  --   CO2 25  --   --  29  --   GLUCOSE 97  --   --  113*  --   BUN 12  --   --  17  --   CREATININE 0.55 0.45 0.42* 0.45 0.59  CALCIUM 8.8*  --    --  8.8*  --    Liver Function Tests:  Recent Labs Lab 07/24/16 1641 07/27/16 0336  AST 17 12*  ALT 14 11*  ALKPHOS 171* 156*  BILITOT 1.0 0.4  PROT 6.5 6.0*  ALBUMIN 3.0* 2.7*    Recent Labs Lab 07/24/16 2115  AMMONIA 16   CBC:  Recent Labs Lab 07/24/16 1641 07/24/16 2115 07/25/16 0432 07/27/16 0336  WBC 10.3  --  7.9 8.4  NEUTROABS 7.8*  --  5.8  --  HGB 11.3*  --  10.2* 10.3*  HCT 34.7* 32.3* 32.0* 32.8*  MCV 89.2  --  90.7 90.6  PLT 398  --  354 374   Urinalysis    Component Value Date/Time   COLORURINE YELLOW 07/25/2016 2225   APPEARANCEUR CLOUDY (A) 07/25/2016 2225   LABSPEC 1.013 07/25/2016 2225   PHURINE 6.0 07/25/2016 2225   GLUCOSEU NEGATIVE 07/25/2016 2225   HGBUR NEGATIVE 07/25/2016 2225   HGBUR trace-intact 10/02/2008 1200   BILIRUBINUR NEGATIVE 07/25/2016 2225   KETONESUR NEGATIVE 07/25/2016 2225   PROTEINUR NEGATIVE 07/25/2016 2225   UROBILINOGEN 1.0 04/28/2014 1607   NITRITE NEGATIVE 07/25/2016 2225   LEUKOCYTESUR SMALL (A) 07/25/2016 2225    Microbiology Recent Results (from Tracy past 240 hour(s))  Blood culture (routine x 2)     Status: None (Preliminary result)   Collection Time: 07/24/16  6:43 PM  Result Value Ref Range Status   Specimen Description BLOOD RIGHT ARM  Final   Special Requests   Final    BOTTLES DRAWN AEROBIC AND ANAEROBIC Blood Culture adequate volume   Culture   Final    NO GROWTH 2 DAYS Performed at [facility], [place] [address], [place], [place] [address]    Report Status PENDING  Incomplete  Blood culture (routine x 2)     Status: None (Preliminary result)   Collection Time: 07/24/16  6:46 PM  Result Value Ref Range Status   Specimen Description BLOOD LEFT ANTECUBITAL  Final   Special Requests   Final    BOTTLES DRAWN AEROBIC AND ANAEROBIC Blood Culture adequate volume   Culture   Final    NO GROWTH 2 DAYS Performed at [facility], [place] [address] [place], [place] [address]    Report  Status PENDING  Incomplete  MRSA PCR Screening     Status: None   Collection Time: 07/24/16  9:43 PM  Result Value Ref Range Status   MRSA by PCR NEGATIVE NEGATIVE Final    Comment:        Tracy GeneXpert MRSA Assay (FDA approved for NASAL specimens only), is one component of a comprehensive MRSA colonization surveillance program. It is not intended to diagnose MRSA infection nor to guide or monitor treatment for MRSA infections.     Time coordinating discharge: Approximately 40 minutes  Vance Gather, MD  [facility] 07/28/2016, 11:24 AM Pager [phone]

## 2016-07-28 NOTE — Progress Notes (Signed)
Initial Nutrition Assessment  DOCUMENTATION CODES:   Non-severe (moderate) malnutrition in context of chronic illness  INTERVENTION:   -Continue Ensure Enlive po BID, each supplement provides 350 kcal and 20 grams of protein -Will downgrade diet to Dysphagia 3 (mechanical soft)  -Encourage PO intake  NUTRITION DIAGNOSIS:   Malnutrition(moderate) related to chronic illness, lethargy/confusion (depression) as evidenced by severe depletion of muscle mass, moderate depletion of body fat.  GOAL:   Patient will meet greater than or equal to 90% of their needs  MONITOR:   PO intake, Supplement acceptance, Labs, Weight trends, I & O's  REASON FOR ASSESSMENT:   Low [place]    ASSESSMENT:   83 y.o. female with a history of RA, depression, anxiety, chronic pain and memory impairment who presented from memory care for decline in functional status. Her daughter-in-law assists with history, statin she has noticed about 1 month of gradually worsening memory, decreased appetite, increasing sleep, social withdrawal, and over the past week this has developed to include imbalance. This was in the wake of recent C. diff infection and UTI, seems like she hasn't bounced back. The patient noted mild coughing without fever and improvement in urinary symptoms with keflex prescribed 6/11.   Patient in room with no family at bedside. Per RN, pt has been confused since admission. Pt soft spoken but answered most of RD's questions appropriately. Some confusion if she had ordered lunch yet. Pt states she has had poor appetite but PO intakes documented have been 50-100%. States she had eggs and pancakes for breakfast with OJ and coffee. Pt reports having trouble swallowing certain foods and prefers to have her meats chopped. Placed order for dysphagia 3 diet. Per chart, she follows a mechanically soft diet at home.   Per chart, weight is stable. Nutrition-Focused physical exam completed. Findings are moderate fat  depletion, severe muscle depletion, and no edema.   Medications: Calcitonin nasal spray daily, Vitamin D tablet daily, Folic acid tablet daily, Prosight(MVI) tablet daily,  Labs reviewed: Vitamin B-12 WNL Folate levels WNL  Diet Order:  DIET DYS 3 Room service appropriate? Yes with Assist; Fluid consistency: Thin  Skin:  Reviewed, no issues  Last BM:  6/26  Height:   Ht Readings from Last 1 Encounters:  07/24/16 5\' 5"  (1.651 m)    Weight:   Wt Readings from Last 1 Encounters:  07/24/16 176 lb 9.4 oz (80.1 kg)    Ideal Body Weight:  56.8 kg  BMI:  Body mass index is 29.39 kg/m.  Estimated Nutritional Needs:   Kcal:  1450-1650  Protein:  60-70g  Fluid:  1.6L/day  EDUCATION NEEDS:   No education needs identified at this time  Clayton Bibles, MS, RD, LDN Pager: [phone] After Hours Pager: [phone]

## 2016-07-29 ENCOUNTER — Other Ambulatory Visit: Payer: Self-pay

## 2016-07-29 MED ORDER — OXYCODONE HCL 5 MG PO TABS: 5.0000 mg | ORAL_TABLET | Freq: Two times a day (BID) | ORAL | 0 refills | Status: AC | PRN

## 2016-07-29 NOTE — Telephone Encounter (Signed)
RX faxed to [facility] @ [phone], phone number [phone] 

## 2016-07-30 LAB — CULTURE, BLOOD (ROUTINE X 2)
Culture: NO GROWTH
Culture: NO GROWTH
SPECIAL REQUESTS: ADEQUATE
Special Requests: ADEQUATE

## 2016-08-01 ENCOUNTER — Encounter: Payer: Self-pay | Admitting: Internal Medicine

## 2016-08-01 ENCOUNTER — Non-Acute Institutional Stay (SKILLED_NURSING_FACILITY): Payer: Medicare Other | Admitting: Internal Medicine

## 2016-08-01 DIAGNOSIS — M069 Rheumatoid arthritis, unspecified: Secondary | ICD-10-CM

## 2016-08-01 DIAGNOSIS — I10 Essential (primary) hypertension: Secondary | ICD-10-CM | POA: Diagnosis not present

## 2016-08-01 DIAGNOSIS — R5381 Other malaise: Secondary | ICD-10-CM | POA: Diagnosis not present

## 2016-08-01 DIAGNOSIS — F29 Unspecified psychosis not due to a substance or known physiological condition: Secondary | ICD-10-CM

## 2016-08-01 DIAGNOSIS — I350 Nonrheumatic aortic (valve) stenosis: Secondary | ICD-10-CM

## 2016-08-01 DIAGNOSIS — J189 Pneumonia, unspecified organism: Secondary | ICD-10-CM

## 2016-08-01 DIAGNOSIS — G894 Chronic pain syndrome: Secondary | ICD-10-CM | POA: Diagnosis not present

## 2016-08-01 DIAGNOSIS — S20211A Contusion of right front wall of thorax, initial encounter: Secondary | ICD-10-CM

## 2016-08-01 DIAGNOSIS — M48062 Spinal stenosis, lumbar region with neurogenic claudication: Secondary | ICD-10-CM | POA: Diagnosis not present

## 2016-08-01 NOTE — Progress Notes (Signed)
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 FINDINGS: Cardiac shadow is mildly enlarged but stable. Aortic calcifications are again seen. The lungs are well aerated bilaterally. Patchy changes are noted in right lower lobe which may represent some very early infiltrate. No sizable effusion is seen. No bony abnormality is noted. IMPRESSION: Early right lower lobe infiltrate. Electronically Signed   By: Inez Catalina M.D.   On: 07/24/2016 17:20   Dg Chest 2 View  Result Date: 07/11/2016 CLINICAL DATA:  Bilateral leg pain for the past 2 days.  Weakness. EXAM: CHEST  2 VIEW COMPARISON:  11/20/2015 FINDINGS: Stable cardiomegaly with aortic atherosclerosis. No acute pulmonary consolidation or pneumothorax. No effusion. Mild central  vascular congestion. No acute nor suspicious osseous abnormalities. IMPRESSION: Stable cardiomegaly with aortic atherosclerosis. Mild central vascular congestion. Electronically Signed   By: Ashley Royalty M.D.   On: 07/11/2016 18:36   Dg Lumbar Spine Complete  Result Date: 07/11/2016 CLINICAL DATA:  Lower extremity pain and generalized weakness EXAM: LUMBAR SPINE - COMPLETE 4+ VIEW COMPARISON:  MRI 09/16/2014, CT 07/21/2015 FINDINGS: There is no evidence of acute lumbar spine fracture. Chronic stable mild compression of L1 is unchanged. Degenerative disc space narrowing L3 through S1. Surgical clips are seen in the right lower quadrant. There is aortic atherosclerosis without aneurysm. Multilevel degenerative facet arthropathy and osteophyte formation off the endplates. Osteoarthritis of the SI joints. IMPRESSION: Chronic stable mild L1 compression. Degenerative disc disease L3 through S1 with lumbar facet arthropathy. No acute osseous appearing abnormality. Electronically Signed   By: Ashley Royalty M.D.   On: 07/11/2016 18:39   Ct Head Wo Contrast  Result Date: 07/24/2016 CLINICAL DATA:  AMS which the staff reports worsened over the coarse of taking Amoxicillin. Per ems patient was alert to self in route, able to verify that she lives in a nursing facility but can't remember the name, continously asked why am I going to the hospital my legs always hurt EXAM: CT HEAD WITHOUT CONTRAST TECHNIQUE: Contiguous axial images were obtained from the base of the skull through the vertex without intravenous contrast. COMPARISON:  02/15/2016 FINDINGS: Brain: No evidence of acute infarction, hemorrhage, hydrocephalus, extra-axial collection or mass lesion/mass effect. There is ventricular, and a lesser degree of sulcal, enlargement reflecting moderate atrophy, stable. Periventricular white matter hypoattenuation is also noted consistent with mild chronic microvascular ischemic change. Vascular: No hyperdense vessel or  unexpected calcification. Skull: Normal. Negative for fracture or focal lesion. Sinuses/Orbits: Globes and orbits are unremarkable. Visualized sinuses and mastoid air cells are clear. Other: None. IMPRESSION: 1. No acute intracranial abnormalities. 2. Atrophy and chronic microvascular ischemic change. Stable appearance from the prior study. Electronically Signed   By: Lajean Manes M.D.   On: 07/24/2016 20:20     Assessment/Plan   ICD-10-CM   1. Physical deconditioning R53.81   2. Psychosis, unspecified psychosis type - failing to change as expected F29   3. Contusion of right front wall of thorax, initial encounter S20.211A    and extends into back  4. Rheumatoid arthritis, involving unspecified site, unspecified rheumatoid factor presence ([place]) M06.9   5. Essential hypertension - elevated I10   6. HCAP (healthcare-associated pneumonia) - improving J18.9   7. Chronic pain syndrome G89.4   8. Moderate aortic stenosis I35.0    mean aortic valve gradient 20 mmHg and AVA 1.4-1.5 cm^2 (June 2018)  9. Spinal stenosis of lumbar region with neurogenic claudication M48.062    Apply cool compress to right flank qshift to reduce contusion  Cont  current meds as ordered. Finish abx  Cont nutritional supplements as ordered  PT/OT/ST as ordered  Refer to facility psych services for psychosis  Check CBC w diff  Monitor BP qshift  GOAL: short term rehab then long term care. Communicated with pt and nursing.  Will follow  Neithan Day S. Perlie Gold  [facility] [address] [place], [place] [address] [phone] Cell (Monday-Friday 8 AM - 5 PM) [phone] After 5 PM and follow prompts

## 2016-08-02 DIAGNOSIS — R2689 Other abnormalities of gait and mobility: Secondary | ICD-10-CM | POA: Diagnosis not present

## 2016-08-02 LAB — CBC AND DIFFERENTIAL
HCT: 36 (ref 36–46)
Hemoglobin: 11.8 — AB (ref 12.0–16.0)
Neutrophils Absolute: 6
Platelets: 346 (ref 150–399)
WBC: 8.8

## 2016-08-10 DIAGNOSIS — R2689 Other abnormalities of gait and mobility: Secondary | ICD-10-CM | POA: Diagnosis not present

## 2016-08-15 DIAGNOSIS — R2689 Other abnormalities of gait and mobility: Secondary | ICD-10-CM | POA: Diagnosis not present

## 2016-08-16 ENCOUNTER — Encounter: Payer: Self-pay | Admitting: Adult Health

## 2016-08-16 ENCOUNTER — Non-Acute Institutional Stay (SKILLED_NURSING_FACILITY): Payer: Medicare Other | Admitting: Adult Health

## 2016-08-16 DIAGNOSIS — F29 Unspecified psychosis not due to a substance or known physiological condition: Secondary | ICD-10-CM

## 2016-08-16 DIAGNOSIS — M069 Rheumatoid arthritis, unspecified: Secondary | ICD-10-CM

## 2016-08-16 DIAGNOSIS — R0902 Hypoxemia: Secondary | ICD-10-CM | POA: Diagnosis not present
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
## 2016-08-18 DIAGNOSIS — R2689 Other abnormalities of gait and mobility: Secondary | ICD-10-CM | POA: Diagnosis not present

## 2016-08-23 ENCOUNTER — Non-Acute Institutional Stay (SKILLED_NURSING_FACILITY): Payer: Medicare Other | Admitting: Adult Health

## 2016-08-23 ENCOUNTER — Encounter: Payer: Self-pay | Admitting: Adult Health

## 2016-08-23 DIAGNOSIS — M159 Polyosteoarthritis, unspecified: Secondary | ICD-10-CM | POA: Diagnosis not present

## 2016-08-23 DIAGNOSIS — G609 Hereditary and idiopathic neuropathy, unspecified: Secondary | ICD-10-CM

## 2016-08-23 DIAGNOSIS — M810 Age-related osteoporosis without current pathological fracture: Secondary | ICD-10-CM

## 2016-08-23 DIAGNOSIS — I10 Essential (primary) hypertension: Secondary | ICD-10-CM
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
## 2016-08-26 DIAGNOSIS — R2689 Other abnormalities of gait and mobility: Secondary | ICD-10-CM | POA: Diagnosis not present

## 2016-08-27 DIAGNOSIS — F209 Schizophrenia, unspecified: Secondary | ICD-10-CM | POA: Insufficient documentation

## 2016-08-30 DIAGNOSIS — H353112 Nonexudative age-related macular degeneration, right eye, intermediate dry stage: Secondary | ICD-10-CM | POA: Diagnosis not present

## 2016-08-30 DIAGNOSIS — H353221 Exudative age-related macular degeneration, left eye, with active choroidal neovascularization: Secondary | ICD-10-CM | POA: Diagnosis not present

## 2016-08-31 DIAGNOSIS — J181 Lobar pneumonia, unspecified organism: Secondary | ICD-10-CM | POA: Diagnosis not present

## 2016-08-31 DIAGNOSIS — R1311 Dysphagia, oral phase: Secondary | ICD-10-CM | POA: Diagnosis not present

## 2016-08-31 DIAGNOSIS — M6281 Muscle weakness (generalized): Secondary | ICD-10-CM | POA: Diagnosis not present

## 2016-09-01 DIAGNOSIS — J181 Lobar pneumonia, unspecified organism: Secondary | ICD-10-CM | POA: Diagnosis not present

## 2016-09-01 DIAGNOSIS — M6281 Muscle weakness (generalized): Secondary | ICD-10-CM | POA: Diagnosis not present

## 2016-09-01 DIAGNOSIS — R1311 Dysphagia, oral phase: Secondary | ICD-10-CM | POA: Diagnosis not present

## 2016-09-02 ENCOUNTER — Encounter: Payer: Self-pay | Admitting: Adult Health

## 2016-09-02 ENCOUNTER — Non-Acute Institutional Stay (SKILLED_NURSING_FACILITY): Payer: Medicare Other | Admitting: Adult Health

## 2016-09-02 DIAGNOSIS — J181 Lobar pneumonia, unspecified organism: Secondary | ICD-10-CM | POA: Diagnosis not present

## 2016-09-02 DIAGNOSIS — F418 Other specified anxiety disorders: Secondary | ICD-10-CM

## 2016-09-02 DIAGNOSIS — R0902 Hypoxemia: Secondary | ICD-10-CM | POA: Diagnosis not present

## 2016-09-02 DIAGNOSIS — M6281 Muscle weakness (generalized): Secondary | ICD-10-CM | POA: Diagnosis not present

## 2016-09-02 DIAGNOSIS — R1311 Dysphagia, oral phase: Secondary | ICD-10-CM | POA: Diagnosis not present
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
## 2016-09-05 DIAGNOSIS — J181 Lobar pneumonia, unspecified organism: Secondary | ICD-10-CM | POA: Diagnosis not present

## 2016-09-05 DIAGNOSIS — R1311 Dysphagia, oral phase: Secondary | ICD-10-CM | POA: Diagnosis not present

## 2016-09-05 DIAGNOSIS — M6281 Muscle weakness (generalized): Secondary | ICD-10-CM | POA: Diagnosis not present

## 2016-09-06 DIAGNOSIS — R1311 Dysphagia, oral phase: Secondary | ICD-10-CM | POA: Diagnosis not present

## 2016-09-06 DIAGNOSIS — M6281 Muscle weakness (generalized): Secondary | ICD-10-CM | POA: Diagnosis not present

## 2016-09-06 DIAGNOSIS — J181 Lobar pneumonia, unspecified organism: Secondary | ICD-10-CM | POA: Diagnosis not present

## 2016-09-07 DIAGNOSIS — R2689 Other abnormalities of gait and mobility: Secondary | ICD-10-CM | POA: Diagnosis not present

## 2016-09-07 DIAGNOSIS — J181 Lobar pneumonia, unspecified organism: Secondary | ICD-10-CM | POA: Diagnosis not present

## 2016-09-07 DIAGNOSIS — R1311 Dysphagia, oral phase: Secondary | ICD-10-CM | POA: Diagnosis not present

## 2016-09-07 DIAGNOSIS — M6281 Muscle weakness (generalized): Secondary | ICD-10-CM | POA: Diagnosis not present

## 2016-09-08 DIAGNOSIS — J181 Lobar pneumonia, unspecified organism: Secondary | ICD-10-CM | POA: Diagnosis not present

## 2016-09-08 DIAGNOSIS — R1311 Dysphagia, oral phase: Secondary | ICD-10-CM | POA: Diagnosis not present

## 2016-09-08 DIAGNOSIS — M6281 Muscle weakness (generalized): Secondary | ICD-10-CM | POA: Diagnosis not present

## 2016-09-09 DIAGNOSIS — R1311 Dysphagia, oral phase: Secondary | ICD-10-CM | POA: Diagnosis not present

## 2016-09-09 DIAGNOSIS — J181 Lobar pneumonia, unspecified organism: Secondary | ICD-10-CM | POA: Diagnosis not present

## 2016-09-09 DIAGNOSIS — M6281 Muscle weakness (generalized): Secondary | ICD-10-CM | POA: Diagnosis not present

## 2016-09-12 DIAGNOSIS — J181 Lobar pneumonia, unspecified organism: Secondary | ICD-10-CM | POA: Diagnosis not present

## 2016-09-12 DIAGNOSIS — R1311 Dysphagia, oral phase: Secondary | ICD-10-CM | POA: Diagnosis not present

## 2016-09-12 DIAGNOSIS — M6281 Muscle weakness (generalized): Secondary | ICD-10-CM | POA: Diagnosis not present

## 2016-09-13 DIAGNOSIS — M6281 Muscle weakness (generalized): Secondary | ICD-10-CM | POA: Diagnosis not present

## 2016-09-13 DIAGNOSIS — R1311 Dysphagia, oral phase: Secondary | ICD-10-CM | POA: Diagnosis not present

## 2016-09-13 DIAGNOSIS — J181 Lobar pneumonia, unspecified organism: Secondary | ICD-10-CM | POA: Diagnosis not present

## 2016-09-14 DIAGNOSIS — F419 Anxiety disorder, unspecified: Secondary | ICD-10-CM | POA: Diagnosis not present

## 2016-09-14 DIAGNOSIS — F329 Major depressive disorder, single episode, unspecified: Secondary | ICD-10-CM | POA: Diagnosis not present

## 2016-09-14 DIAGNOSIS — F29 Unspecified psychosis not due to a substance or known physiological condition: Secondary | ICD-10-CM | POA: Diagnosis not present

## 2016-09-14 DIAGNOSIS — R2689 Other abnormalities of gait and mobility: Secondary | ICD-10-CM | POA: Diagnosis not present

## 2016-09-14 DIAGNOSIS — J181 Lobar pneumonia, unspecified organism: Secondary | ICD-10-CM | POA: Diagnosis not present

## 2016-09-14 DIAGNOSIS — R1311 Dysphagia, oral phase: Secondary | ICD-10-CM | POA: Diagnosis not present

## 2016-09-14 DIAGNOSIS — M6281 Muscle weakness (generalized): Secondary | ICD-10-CM | POA: Diagnosis not present

## 2016-09-15 DIAGNOSIS — Z79899 Other long term (current) drug therapy: Secondary | ICD-10-CM | POA: Diagnosis not present

## 2016-09-15 DIAGNOSIS — J181 Lobar pneumonia, unspecified organism: Secondary | ICD-10-CM | POA: Diagnosis not present

## 2016-09-15 DIAGNOSIS — M2550 Pain in unspecified joint: Secondary | ICD-10-CM | POA: Diagnosis not present

## 2016-09-15 DIAGNOSIS — M6281 Muscle weakness (generalized): Secondary | ICD-10-CM | POA: Diagnosis not present

## 2016-09-15 DIAGNOSIS — R1311 Dysphagia, oral phase: Secondary | ICD-10-CM | POA: Diagnosis not present

## 2016-09-15 DIAGNOSIS — M810 Age-related osteoporosis without current pathological fracture: Secondary | ICD-10-CM | POA: Diagnosis not present

## 2016-09-15 DIAGNOSIS — M150 Primary generalized (osteo)arthritis: Secondary | ICD-10-CM | POA: Diagnosis not present

## 2016-09-15 DIAGNOSIS — M0609 Rheumatoid arthritis without rheumatoid factor, multiple sites: Secondary | ICD-10-CM | POA: Diagnosis not present

## 2016-09-16 DIAGNOSIS — J181 Lobar pneumonia, unspecified organism: Secondary | ICD-10-CM | POA: Diagnosis not present

## 2016-09-16 DIAGNOSIS — R1311 Dysphagia, oral phase: Secondary | ICD-10-CM | POA: Diagnosis not present

## 2016-09-16 DIAGNOSIS — M6281 Muscle weakness (generalized): Secondary | ICD-10-CM | POA: Diagnosis not present

## 2016-09-19 DIAGNOSIS — M6281 Muscle weakness (generalized): Secondary | ICD-10-CM | POA: Diagnosis not present

## 2016-09-19 DIAGNOSIS — J181 Lobar pneumonia, unspecified organism: Secondary | ICD-10-CM | POA: Diagnosis not present

## 2016-09-19 DIAGNOSIS — R1311 Dysphagia, oral phase: Secondary | ICD-10-CM | POA: Diagnosis not present

## 2016-09-20 DIAGNOSIS — R1311 Dysphagia, oral phase: Secondary | ICD-10-CM | POA: Diagnosis not present

## 2016-09-20 DIAGNOSIS — J181 Lobar pneumonia, unspecified organism: Secondary | ICD-10-CM | POA: Diagnosis not present

## 2016-09-20 DIAGNOSIS — M6281 Muscle weakness (generalized): Secondary | ICD-10-CM | POA: Diagnosis not present

## 2016-09-21 DIAGNOSIS — J181 Lobar pneumonia, unspecified organism: Secondary | ICD-10-CM | POA: Diagnosis not present

## 2016-09-21 DIAGNOSIS — M6281 Muscle weakness (generalized): Secondary | ICD-10-CM | POA: Diagnosis not present

## 2016-09-21 DIAGNOSIS — R1311 Dysphagia, oral phase: Secondary | ICD-10-CM | POA: Diagnosis not present

## 2016-09-22 DIAGNOSIS — M6281 Muscle weakness (generalized): Secondary | ICD-10-CM | POA: Diagnosis not present

## 2016-09-22 DIAGNOSIS — R2689 Other abnormalities of gait and mobility: Secondary | ICD-10-CM | POA: Diagnosis not present

## 2016-09-22 DIAGNOSIS — J181 Lobar pneumonia, unspecified organism: Secondary | ICD-10-CM | POA: Diagnosis not present

## 2016-09-22 DIAGNOSIS — R1311 Dysphagia, oral phase: Secondary | ICD-10-CM | POA: Diagnosis not present

## 2016-09-23 DIAGNOSIS — M6281 Muscle weakness (generalized): Secondary | ICD-10-CM | POA: Diagnosis not present

## 2016-09-23 DIAGNOSIS — R1311 Dysphagia, oral phase: Secondary | ICD-10-CM | POA: Diagnosis not present

## 2016-09-23 DIAGNOSIS — J181 Lobar pneumonia, unspecified organism: Secondary | ICD-10-CM | POA: Diagnosis not present

## 2016-09-25 ENCOUNTER — Encounter: Payer: Self-pay | Admitting: Adult Health

## 2016-09-25 ENCOUNTER — Non-Acute Institutional Stay (SKILLED_NURSING_FACILITY): Payer: Medicare Other | Admitting: Adult Health

## 2016-09-25 DIAGNOSIS — M069 Rheumatoid arthritis, unspecified: Secondary | ICD-10-CM | POA: Diagnosis not present

## 2016-09-25 DIAGNOSIS — K529 Noninfective gastroenteritis and colitis, unspecified: Secondary | ICD-10-CM | POA: Diagnosis not present

## 2016-09-25 DIAGNOSIS — N3946 Mixed incontinence: Secondary | ICD-10-CM

## 2016-09-25 DIAGNOSIS — F418 Other specified anxiety disorders: Secondary | ICD-10-CM | POA: Diagnosis not present
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
## 2016-09-26 DIAGNOSIS — J181 Lobar pneumonia, unspecified organism: Secondary | ICD-10-CM | POA: Diagnosis not present

## 2016-09-26 DIAGNOSIS — M6281 Muscle weakness (generalized): Secondary | ICD-10-CM | POA: Diagnosis not present

## 2016-09-26 DIAGNOSIS — R1311 Dysphagia, oral phase: Secondary | ICD-10-CM | POA: Diagnosis not present

## 2016-09-27 DIAGNOSIS — R1311 Dysphagia, oral phase: Secondary | ICD-10-CM | POA: Diagnosis not present

## 2016-09-27 DIAGNOSIS — M6281 Muscle weakness (generalized): Secondary | ICD-10-CM | POA: Diagnosis not present

## 2016-09-27 DIAGNOSIS — J181 Lobar pneumonia, unspecified organism: Secondary | ICD-10-CM | POA: Diagnosis not present

## 2016-09-28 DIAGNOSIS — R1311 Dysphagia, oral phase: Secondary | ICD-10-CM | POA: Diagnosis not present

## 2016-09-28 DIAGNOSIS — J181 Lobar pneumonia, unspecified organism: Secondary | ICD-10-CM | POA: Diagnosis not present

## 2016-09-28 DIAGNOSIS — M6281 Muscle weakness (generalized): Secondary | ICD-10-CM | POA: Diagnosis not present

## 2016-09-29 DIAGNOSIS — M6281 Muscle weakness (generalized): Secondary | ICD-10-CM | POA: Diagnosis not present

## 2016-09-29 DIAGNOSIS — J181 Lobar pneumonia, unspecified organism: Secondary | ICD-10-CM | POA: Diagnosis not present

## 2016-09-29 DIAGNOSIS — R1311 Dysphagia, oral phase: Secondary | ICD-10-CM | POA: Diagnosis not present

## 2016-09-30 DIAGNOSIS — J181 Lobar pneumonia, unspecified organism: Secondary | ICD-10-CM | POA: Diagnosis not present

## 2016-09-30 DIAGNOSIS — M6281 Muscle weakness (generalized): Secondary | ICD-10-CM | POA: Diagnosis not present

## 2016-09-30 DIAGNOSIS — R1311 Dysphagia, oral phase: Secondary | ICD-10-CM | POA: Diagnosis not present

## 2016-10-03 DIAGNOSIS — J181 Lobar pneumonia, unspecified organism: Secondary | ICD-10-CM | POA: Diagnosis not present

## 2016-10-03 DIAGNOSIS — R1311 Dysphagia, oral phase: Secondary | ICD-10-CM | POA: Diagnosis not present

## 2016-10-03 DIAGNOSIS — M6281 Muscle weakness (generalized): Secondary | ICD-10-CM | POA: Diagnosis not present

## 2016-10-04 DIAGNOSIS — M6281 Muscle weakness (generalized): Secondary | ICD-10-CM | POA: Diagnosis not present

## 2016-10-04 DIAGNOSIS — J181 Lobar pneumonia, unspecified organism: Secondary | ICD-10-CM | POA: Diagnosis not present

## 2016-10-04 DIAGNOSIS — R1311 Dysphagia, oral phase: Secondary | ICD-10-CM | POA: Diagnosis not present

## 2016-10-05 DIAGNOSIS — J181 Lobar pneumonia, unspecified organism: Secondary | ICD-10-CM | POA: Diagnosis not present

## 2016-10-05 DIAGNOSIS — M6281 Muscle weakness (generalized): Secondary | ICD-10-CM | POA: Diagnosis not present

## 2016-10-05 DIAGNOSIS — R1311 Dysphagia, oral phase: Secondary | ICD-10-CM | POA: Diagnosis not present

## 2016-10-06 DIAGNOSIS — J181 Lobar pneumonia, unspecified organism: Secondary | ICD-10-CM | POA: Diagnosis not present

## 2016-10-06 DIAGNOSIS — H353221 Exudative age-related macular degeneration, left eye, with active choroidal neovascularization: Secondary | ICD-10-CM | POA: Diagnosis not present

## 2016-10-06 DIAGNOSIS — R1311 Dysphagia, oral phase: Secondary | ICD-10-CM | POA: Diagnosis not present

## 2016-10-06 DIAGNOSIS — M6281 Muscle weakness (generalized): Secondary | ICD-10-CM | POA: Diagnosis not present

## 2016-10-07 DIAGNOSIS — R1311 Dysphagia, oral phase: Secondary | ICD-10-CM | POA: Diagnosis not present

## 2016-10-07 DIAGNOSIS — J181 Lobar pneumonia, unspecified organism: Secondary | ICD-10-CM | POA: Diagnosis not present

## 2016-10-07 DIAGNOSIS — M6281 Muscle weakness (generalized): Secondary | ICD-10-CM | POA: Diagnosis not present

## 2016-10-10 DIAGNOSIS — R1311 Dysphagia, oral phase: Secondary | ICD-10-CM | POA: Diagnosis not present

## 2016-10-10 DIAGNOSIS — J181 Lobar pneumonia, unspecified organism: Secondary | ICD-10-CM | POA: Diagnosis not present

## 2016-10-10 DIAGNOSIS — M6281 Muscle weakness (generalized): Secondary | ICD-10-CM | POA: Diagnosis not present

## 2016-10-11 ENCOUNTER — Encounter: Payer: Self-pay | Admitting: Adult Health

## 2016-10-11 ENCOUNTER — Non-Acute Institutional Stay (SKILLED_NURSING_FACILITY): Payer: Medicare Other | Admitting: Adult Health

## 2016-10-11 DIAGNOSIS — G8929 Other chronic pain: Secondary | ICD-10-CM

## 2016-10-11 DIAGNOSIS — M6281 Muscle weakness (generalized): Secondary | ICD-10-CM | POA: Diagnosis not present

## 2016-10-11 DIAGNOSIS — F418 Other specified anxiety disorders: Secondary | ICD-10-CM

## 2016-10-11 DIAGNOSIS — F29 Unspecified psychosis not due to a substance or known physiological condition: Secondary | ICD-10-CM | POA: Diagnosis not present

## 2016-10-11 DIAGNOSIS — M79605 Pain in left leg: Secondary | ICD-10-CM

## 2016-10-11 DIAGNOSIS — R1311 Dysphagia, oral phase: Secondary | ICD-10-CM | POA: Diagnosis not present

## 2016-10-11 DIAGNOSIS — M79604 Pain in right leg: Secondary | ICD-10-CM | POA: Diagnosis not present

## 2016-10-11 DIAGNOSIS — J181 Lobar pneumonia, unspecified organism: Secondary | ICD-10-CM | POA: Diagnosis not present
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
## 2016-10-12 DIAGNOSIS — J181 Lobar pneumonia, unspecified organism: Secondary | ICD-10-CM | POA: Diagnosis not present

## 2016-10-12 DIAGNOSIS — M6281 Muscle weakness (generalized): Secondary | ICD-10-CM | POA: Diagnosis not present

## 2016-10-12 DIAGNOSIS — R1311 Dysphagia, oral phase: Secondary | ICD-10-CM | POA: Diagnosis not present

## 2016-10-13 DIAGNOSIS — R1311 Dysphagia, oral phase: Secondary | ICD-10-CM | POA: Diagnosis not present

## 2016-10-13 DIAGNOSIS — M6281 Muscle weakness (generalized): Secondary | ICD-10-CM | POA: Diagnosis not present

## 2016-10-13 DIAGNOSIS — R2689 Other abnormalities of gait and mobility: Secondary | ICD-10-CM | POA: Diagnosis not present

## 2016-10-13 DIAGNOSIS — J181 Lobar pneumonia, unspecified organism: Secondary | ICD-10-CM | POA: Diagnosis not present

## 2016-10-14 ENCOUNTER — Other Ambulatory Visit: Payer: Self-pay

## 2016-10-14 DIAGNOSIS — M6281 Muscle weakness (generalized): Secondary | ICD-10-CM | POA: Diagnosis not present

## 2016-10-14 DIAGNOSIS — R1311 Dysphagia, oral phase: Secondary | ICD-10-CM | POA: Diagnosis not present

## 2016-10-14 DIAGNOSIS — J181 Lobar pneumonia, unspecified organism: Secondary | ICD-10-CM | POA: Diagnosis not present

## 2016-10-14 MED ORDER — TRAMADOL HCL 50 MG PO TABS: ORAL_TABLET | ORAL | 0 refills | Status: DC

## 2016-10-14 NOTE — Telephone Encounter (Signed)
RX faxed to [facility] @ [phone], phone number [phone] 

## 2016-10-17 DIAGNOSIS — R1311 Dysphagia, oral phase: Secondary | ICD-10-CM | POA: Diagnosis not present

## 2016-10-17 DIAGNOSIS — M6281 Muscle weakness (generalized): Secondary | ICD-10-CM | POA: Diagnosis not present

## 2016-10-17 DIAGNOSIS — J181 Lobar pneumonia, unspecified organism: Secondary | ICD-10-CM | POA: Diagnosis not present

## 2016-10-18 DIAGNOSIS — M6281 Muscle weakness (generalized): Secondary | ICD-10-CM | POA: Diagnosis not present

## 2016-10-18 DIAGNOSIS — J181 Lobar pneumonia, unspecified organism: Secondary | ICD-10-CM | POA: Diagnosis not present

## 2016-10-18 DIAGNOSIS — R1311 Dysphagia, oral phase: Secondary | ICD-10-CM | POA: Diagnosis not present

## 2016-10-19 DIAGNOSIS — R1311 Dysphagia, oral phase: Secondary | ICD-10-CM | POA: Diagnosis not present

## 2016-10-19 DIAGNOSIS — J181 Lobar pneumonia, unspecified organism: Secondary | ICD-10-CM | POA: Diagnosis not present

## 2016-10-19 DIAGNOSIS — R2689 Other abnormalities of gait and mobility: Secondary | ICD-10-CM | POA: Diagnosis not present

## 2016-10-19 DIAGNOSIS — M6281 Muscle weakness (generalized): Secondary | ICD-10-CM | POA: Diagnosis not present

## 2016-10-20 DIAGNOSIS — J181 Lobar pneumonia, unspecified organism: Secondary | ICD-10-CM | POA: Diagnosis not present

## 2016-10-20 DIAGNOSIS — R1311 Dysphagia, oral phase: Secondary | ICD-10-CM | POA: Diagnosis not present

## 2016-10-20 DIAGNOSIS — M6281 Muscle weakness (generalized): Secondary | ICD-10-CM | POA: Diagnosis not present

## 2016-10-24 ENCOUNTER — Encounter: Payer: Self-pay | Admitting: Adult Health

## 2016-10-24 ENCOUNTER — Non-Acute Institutional Stay (SKILLED_NURSING_FACILITY): Payer: Medicare Other | Admitting: Adult Health

## 2016-10-24 DIAGNOSIS — I10 Essential (primary) hypertension: Secondary | ICD-10-CM

## 2016-10-24 DIAGNOSIS — R0902 Hypoxemia: Secondary | ICD-10-CM | POA: Diagnosis not present

## 2016-10-24 DIAGNOSIS — F29 Unspecified psychosis not due to a substance or known physiological condition: Secondary | ICD-10-CM

## 2016-10-24 DIAGNOSIS — M79604 Pain in right leg: Secondary | ICD-10-CM

## 2016-10-24 DIAGNOSIS — M79605 Pain in left leg: Secondary | ICD-10-CM

## 2016-10-24 DIAGNOSIS — G8929 Other chronic pain: Secondary | ICD-10-CM | POA: Diagnosis not present
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
## 2016-10-25 DIAGNOSIS — M6281 Muscle weakness (generalized): Secondary | ICD-10-CM | POA: Diagnosis not present

## 2016-10-25 DIAGNOSIS — J181 Lobar pneumonia, unspecified organism: Secondary | ICD-10-CM | POA: Diagnosis not present

## 2016-10-25 DIAGNOSIS — R2689 Other abnormalities of gait and mobility: Secondary | ICD-10-CM | POA: Diagnosis not present

## 2016-10-25 DIAGNOSIS — R1311 Dysphagia, oral phase: Secondary | ICD-10-CM | POA: Diagnosis not present

## 2016-10-26 DIAGNOSIS — M6281 Muscle weakness (generalized): Secondary | ICD-10-CM | POA: Diagnosis not present

## 2016-10-26 DIAGNOSIS — R1311 Dysphagia, oral phase: Secondary | ICD-10-CM | POA: Diagnosis not present

## 2016-10-26 DIAGNOSIS — J181 Lobar pneumonia, unspecified organism: Secondary | ICD-10-CM | POA: Diagnosis not present

## 2016-10-27 DIAGNOSIS — J181 Lobar pneumonia, unspecified organism: Secondary | ICD-10-CM | POA: Diagnosis not present

## 2016-10-27 DIAGNOSIS — M6281 Muscle weakness (generalized): Secondary | ICD-10-CM | POA: Diagnosis not present

## 2016-10-27 DIAGNOSIS — R1311 Dysphagia, oral phase: Secondary | ICD-10-CM | POA: Diagnosis not present

## 2016-10-28 DIAGNOSIS — R1311 Dysphagia, oral phase: Secondary | ICD-10-CM | POA: Diagnosis not present

## 2016-10-28 DIAGNOSIS — J181 Lobar pneumonia, unspecified organism: Secondary | ICD-10-CM | POA: Diagnosis not present

## 2016-10-28 DIAGNOSIS — M6281 Muscle weakness (generalized): Secondary | ICD-10-CM | POA: Diagnosis not present

## 2016-10-30 ENCOUNTER — Encounter (HOSPITAL_COMMUNITY): Payer: Self-pay

## 2016-10-30 ENCOUNTER — Emergency Department (HOSPITAL_COMMUNITY)
Admission: EM | Admit: 2016-10-30 | Discharge: 2016-10-31 | Disposition: A | Discharge status: Home / Self Care | Payer: Medicare Other | Attending: Emergency Medicine | Admitting: Emergency Medicine

## 2016-10-30 DIAGNOSIS — F23 Brief psychotic disorder: Secondary | ICD-10-CM | POA: Diagnosis not present

## 2016-10-30 DIAGNOSIS — M069 Rheumatoid arthritis, unspecified: Secondary | ICD-10-CM | POA: Insufficient documentation

## 2016-10-30 DIAGNOSIS — I2510 Atherosclerotic heart disease of native coronary artery without angina pectoris: Secondary | ICD-10-CM | POA: Insufficient documentation

## 2016-10-30 DIAGNOSIS — Z79899 Other long term (current) drug therapy: Secondary | ICD-10-CM | POA: Diagnosis not present

## 2016-10-30 DIAGNOSIS — F4323 Adjustment disorder with mixed anxiety and depressed mood: Secondary | ICD-10-CM

## 2016-10-30 DIAGNOSIS — R4589 Other symptoms and signs involving emotional state: Secondary | ICD-10-CM

## 2016-10-30 DIAGNOSIS — R4587 Impulsiveness: Secondary | ICD-10-CM | POA: Diagnosis not present

## 2016-10-30 DIAGNOSIS — F329 Major depressive disorder, single episode, unspecified: Secondary | ICD-10-CM

## 2016-10-30 DIAGNOSIS — R45851 Suicidal ideations: Secondary | ICD-10-CM | POA: Diagnosis not present

## 2016-10-30 DIAGNOSIS — Z7982 Long term (current) use of aspirin: Secondary | ICD-10-CM | POA: Insufficient documentation

## 2016-10-30 NOTE — ED Triage Notes (Signed)
States staff at [facility] that pt became angry and said she wanted to choke herself with her O2 tubing, but on arrival denies wanting to hurt herself no pain voiced.
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
## 2016-10-31 DIAGNOSIS — F329 Major depressive disorder, single episode, unspecified: Secondary | ICD-10-CM | POA: Diagnosis not present

## 2016-10-31 DIAGNOSIS — R4182 Altered mental status, unspecified: Secondary | ICD-10-CM | POA: Diagnosis not present

## 2016-10-31 DIAGNOSIS — R4587 Impulsiveness: Secondary | ICD-10-CM | POA: Diagnosis not present

## 2016-10-31 DIAGNOSIS — F4323 Adjustment disorder with mixed anxiety and depressed mood: Secondary | ICD-10-CM | POA: Diagnosis not present

## 2016-10-31 LAB — CBC
HEMATOCRIT: 36.4 % (ref 36.0–46.0)
HEMOGLOBIN: 11.4 g/dL — AB (ref 12.0–15.0)
MCH: 28.4 pg (ref 26.0–34.0)
MCHC: 31.3 g/dL (ref 30.0–36.0)
MCV: 90.5 fL (ref 78.0–100.0)
Platelets: 223 10*3/uL (ref 150–400)
RBC: 4.02 MIL/uL (ref 3.87–5.11)
RDW: 15.9 % — ABNORMAL HIGH (ref 11.5–15.5)
WBC: 6.5 10*3/uL (ref 4.0–10.5)

## 2016-10-31 LAB — COMPREHENSIVE METABOLIC PANEL
ALBUMIN: 2.9 g/dL — AB (ref 3.5–5.0)
ALT: 10 U/L — ABNORMAL LOW (ref 14–54)
AST: 15 U/L (ref 15–41)
Alkaline Phosphatase: 82 U/L (ref 38–126)
Anion gap: 7 (ref 5–15)
BILIRUBIN TOTAL: 0.5 mg/dL (ref 0.3–1.2)
BUN: 13 mg/dL (ref 6–20)
CO2: 28 mmol/L (ref 22–32)
Calcium: 9.4 mg/dL (ref 8.9–10.3)
Chloride: 105 mmol/L (ref 101–111)
Creatinine, Ser: 0.67 mg/dL (ref 0.44–1.00)
GFR calc Af Amer: >60 mL/min (ref >60)
GFR calc non Af Amer: >60 mL/min (ref >60)
GLUCOSE: 93 mg/dL (ref 65–99)
POTASSIUM: 3.6 mmol/L (ref 3.5–5.1)
Sodium: 140 mmol/L (ref 135–145)
Total Protein: 6.6 g/dL (ref 6.5–8.1)

## 2016-10-31 LAB — SALICYLATE LEVEL: Salicylate Lvl: <7 mg/dL (ref 2.8–30.0)

## 2016-10-31 LAB — ACETAMINOPHEN LEVEL: Acetaminophen (Tylenol), Serum: <10 ug/mL — ABNORMAL LOW (ref 10–30)

## 2016-10-31 LAB — RAPID URINE DRUG SCREEN, HOSP PERFORMED
Amphetamines: NOT DETECTED
BARBITURATES: NOT DETECTED
BENZODIAZEPINES: NOT DETECTED
Cocaine: NOT DETECTED
Opiates: NOT DETECTED
Tetrahydrocannabinol: NOT DETECTED

## 2016-10-31 LAB — ETHANOL: Alcohol, Ethyl (B): <10 mg/dL (ref <10)

## 2016-10-31 MED ORDER — DICLOFENAC SODIUM 1 % TD GEL
4.0000 g | Freq: Four times a day (QID) | TRANSDERMAL | Status: DC | PRN
  Filled 2016-10-31: qty 100

## 2016-10-31 MED ORDER — RISAQUAD PO CAPS
1.0000 | ORAL_CAPSULE | Freq: Every day | ORAL | Status: DC
  Administered 2016-10-31: 1 via ORAL
  Filled 2016-10-31: qty 1

## 2016-10-31 MED ORDER — VITAMIN D 1000 UNITS PO TABS
2000.0000 [IU] | ORAL_TABLET | Freq: Every day | ORAL | Status: DC
  Administered 2016-10-31: 2000 [IU] via ORAL
  Filled 2016-10-31: qty 2

## 2016-10-31 MED ORDER — PROSIGHT PO TABS
1.0000 | ORAL_TABLET | Freq: Every day | ORAL | Status: DC
  Administered 2016-10-31: 1 via ORAL
  Filled 2016-10-31: qty 1

## 2016-10-31 MED ORDER — MELATONIN 5 MG PO TABS: 5.0000 mg | ORAL_TABLET | Freq: Every day | ORAL | Status: DC

## 2016-10-31 MED ORDER — BUPROPION HCL ER (XL) 150 MG PO TB24
300.0000 mg | ORAL_TABLET | Freq: Every day | ORAL | Status: DC
  Administered 2016-10-31: 300 mg via ORAL
  Filled 2016-10-31: qty 2

## 2016-10-31 MED ORDER — RISPERIDONE 0.5 MG PO TABS
0.2500 mg | ORAL_TABLET | Freq: Every day | ORAL | Status: DC
  Administered 2016-10-31: 0.25 mg via ORAL
  Filled 2016-10-31: qty 1

## 2016-10-31 MED ORDER — ASPIRIN EC 81 MG PO TBEC
81.0000 mg | DELAYED_RELEASE_TABLET | Freq: Every day | ORAL | Status: DC
  Administered 2016-10-31: 81 mg via ORAL
  Filled 2016-10-31: qty 1

## 2016-10-31 MED ORDER — METHOTREXATE 2.5 MG PO TABS: 10.0000 mg | ORAL_TABLET | ORAL | Status: DC

## 2016-10-31 MED ORDER — DULOXETINE HCL 30 MG PO CPEP
60.0000 mg | ORAL_CAPSULE | Freq: Two times a day (BID) | ORAL | Status: DC
  Administered 2016-10-31 (×2): 60 mg via ORAL
  Filled 2016-10-31 (×2): qty 2

## 2016-10-31 MED ORDER — DOCUSATE SODIUM 100 MG PO CAPS: 100.0000 mg | ORAL_CAPSULE | Freq: Every day | ORAL | Status: DC | PRN

## 2016-10-31 MED ORDER — OXYCODONE HCL 5 MG PO TABS: 5.0000 mg | ORAL_TABLET | Freq: Two times a day (BID) | ORAL | Status: DC | PRN

## 2016-10-31 MED ORDER — FOLIC ACID 1 MG PO TABS
1.0000 mg | ORAL_TABLET | Freq: Every day | ORAL | Status: DC
  Administered 2016-10-31: 1 mg via ORAL
  Filled 2016-10-31: qty 1

## 2016-10-31 MED ORDER — GABAPENTIN 100 MG PO CAPS
100.0000 mg | ORAL_CAPSULE | Freq: Three times a day (TID) | ORAL | Status: DC
  Administered 2016-10-31: 100 mg via ORAL
  Filled 2016-10-31: qty 1

## 2016-10-31 MED ORDER — ENSURE ENLIVE PO LIQD
120.0000 mL | Freq: Every day | ORAL | Status: DC
  Administered 2016-10-31: 120 mL via ORAL
  Filled 2016-10-31: qty 237

## 2016-10-31 MED ORDER — CALCITONIN (SALMON) 200 UNIT/ACT NA SOLN
1.0000 | Freq: Every day | NASAL | Status: DC
  Filled 2016-10-31: qty 3.7

## 2016-10-31 MED ORDER — ACETAMINOPHEN 500 MG PO TABS
1000.0000 mg | ORAL_TABLET | Freq: Three times a day (TID) | ORAL | Status: DC
  Administered 2016-10-31: 1000 mg via ORAL
  Filled 2016-10-31: qty 2

## 2016-10-31 MED ORDER — MIRABEGRON ER 25 MG PO TB24
50.0000 mg | ORAL_TABLET | Freq: Every day | ORAL | Status: DC
  Administered 2016-10-31: 50 mg via ORAL
  Filled 2016-10-31: qty 2

## 2016-10-31 MED ORDER — CHOLESTYRAMINE LIGHT 4 G PO PACK
4.0000 g | PACK | Freq: Every day | ORAL | Status: DC
  Administered 2016-10-31: 4 g via ORAL
  Filled 2016-10-31: qty 1

## 2016-10-31 MED ORDER — BUSPIRONE HCL 10 MG PO TABS
15.0000 mg | ORAL_TABLET | Freq: Three times a day (TID) | ORAL | Status: DC
  Administered 2016-10-31: 15 mg via ORAL
  Filled 2016-10-31: qty 2
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
## 2016-10-31 NOTE — ED Notes (Signed)
TTS at bedside. 

## 2016-10-31 NOTE — [facility] Suicide Risk Assessment (Signed)
Suicide Risk Assessment  Discharge Assessment   [facility] Discharge Suicide Risk Assessment   Principal Problem: Adjustment disorder with mixed anxiety and depressed mood Discharge Diagnoses:  Patient Active Problem List   Diagnosis Date Noted  . Adjustment disorder with mixed anxiety and depressed mood [F43.23] 10/31/2016  . Chronic diarrhea [K52.9] 09/25/2016  . Psychosis ([place]) [F29] 08/27/2016  . Hypoxia [R09.02] 07/28/2016  . Moderate aortic stenosis [I35.0] 07/27/2016  . RLL pneumonia ([place]) [J18.1] 07/25/2016  . Weakness [R53.1]   . HCAP (healthcare-associated pneumonia) [J18.9] 07/24/2016  . Anemia [D64.9] 07/24/2016  . Acute encephalopathy [G93.40] 07/24/2016  . Chronic instability of right knee [M23.51] 03/10/2016  . Spinal stenosis of lumbar region with neurogenic claudication [M48.062] 12/14/2015  . Malnutrition of moderate degree [E44.0] 07/22/2015  . Physical deconditioning [R53.81] 07/21/2015  . Compression fracture of L1 lumbar vertebra ([place]) [S32.010A] 07/21/2015  . Rheumatoid arthritis ([place]) [M06.9] 07/21/2015  . Bladder spasm [N32.89] 07/21/2015  . Peripheral neuropathy [G62.9] 07/21/2015  . Insomnia [G47.00] 07/21/2015  . Bilateral lower extremity edema [R60.0] 09/24/2014  . GERD (gastroesophageal reflux disease) [K21.9] 09/17/2014  . Recurrent falls [R29.6] 09/17/2014  . General weakness [R53.1]   . Chronic leg pain [M79.606, G89.29] 09/16/2014  . Compression fracture [IMO0002]   . Varicose veins [I83.90] 07/11/2012  . Osteoporosis [M81.0] 12/09/2009  . Obesity [E66.9] 07/07/2008  . SHOULDER PAIN, BILATERAL [M25.519] 05/01/2008  . GAIT DISTURBANCE [R26.9] 01/09/2007  . C A D [I25.10] 01/08/2007  . HLD (hyperlipidemia) [E78.5] 11/03/2006  . Depression with anxiety [F41.8] 11/03/2006  . Essential hypertension [I10] 11/03/2006  . Osteoarthritis [M19.90] 11/03/2006  . SYMPTOM, INCONTINENCE, MIXED, URGE/STRESS [N39.46] 11/03/2006   Per notes: Tracy Sharp is an 83  y.o. female, who presents voluntary and unaccompanied to [facility] Pt reported, she was playing with her oxygen cord, and staff at [facility] thought she was trying to commit suicide. Pt reported, she has been sad because her grandson was found dead on his mothers' patio, last night. Pt denies, SI, HI, AVH, self-injurious behaviors and access to weapons.  Today,  Pt denies suicidal/homicidal ideation, denies auditory/visual hallucinations and does not appear to be responding to internal stimuli. Pt stated she wants to go back to the rehab facility. Pt is psychiatrically cleared for discharge.  Total Time spent with patient: 45 minutes  Musculoskeletal: Strength & Muscle Tone: within normal limits Gait & Station: normal Patient leans: N/A  Psychiatric Specialty Exam:   Blood pressure (!) 154/64, pulse 77, temperature 97.8 F (36.6 C), temperature source Oral, resp. rate 19, height 5\' 4"  (1.626 m), weight 73.9 kg (163 lb), SpO2 100 %.Body mass index is 27.98 kg/m.  General Appearance: Casual  Eye Contact::  Fair  Speech:  Clear and Coherent and Normal Rate409  Volume:  Normal  Mood:  Anxious and Depressed  Affect:  Congruent, Depressed and Tearful  Thought Process:  Coherent, Goal Directed and Linear  Orientation:  Other:  self and time  Thought Content:  Logical  Suicidal Thoughts:  No  Homicidal Thoughts:  No  Memory:  Immediate;   Fair Recent;   Fair Remote;   Fair  Judgement:  Fair  Insight:  Fair  Psychomotor Activity:  Normal  Concentration:  Good  Recall:  [place] of Knowledge:Good  Language: Good  Akathisia:  No  Handed:  Right  AIMS (if indicated):     Assets:  Agricultural consultant Housing Leisure Time Resilience Social Support  Sleep:     Cognition: WNL  ADL's:  Intact   Mental Status Per Nursing Assessment::   On Admission:     Demographic Factors:  Divorced or widowed and Caucasian  Loss Factors: Decline in physical  health  Historical Factors: NA  Risk Reduction Factors:   Sense of responsibility to family and Positive social support  Continued Clinical Symptoms:  Severe Anxiety and/or Agitation Depression:   Impulsivity  Cognitive Features That Contribute To Risk:  Closed-mindedness    Suicide Risk:  Minimal: No identifiable suicidal ideation.  Patients presenting with no risk factors but with morbid ruminations; may be classified as minimal risk based on the severity of the depressive symptoms  Contact information for after-discharge care    Destination    [facility] .   Specialty:  [place] information: [address] [place] [phone]              Plan Of Care/Follow-up recommendations:  Activity:  as tolerated  Diet:  Heart Healthy  Ethelene Hal, NP 10/31/2016, 12:07 PM

## 2016-10-31 NOTE — ED Notes (Addendum)
Per night RN, patient tired of laying in bed and prefers to stay sitting in wheelchair.  Patient given cup coffee

## 2016-10-31 NOTE — Progress Notes (Addendum)
LCSW consulted:  Patient is from [facility]  Location:   [place] Room Number: [place] of Service:  SNF (31)  Call placed to Wells Guiles who is covering for [place] as regular admissions coordinator is off. Wells Guiles reports patient is able to return. Clinicals sent in [place] as requested to show ED course of treatment. Patient is medically and psychiatrically cleared at this time. Chantell from [facility] also called and confirmed patient returning. Call placed to RN as patient is in hallway bed to alert of discharge and can return at any time.   DC back to SNF today.  Lane Hacker, MSW Clinical Social Work: [employer] Coverage for :  [phone]

## 2016-10-31 NOTE — ED Notes (Signed)
Crushed medications and put in applesauce

## 2016-10-31 NOTE — ED Notes (Signed)
Called Patient's daughtet-in-law and made aware patient was up for discharge and if she was able to transport patient back to SNF, she isnt.   Called [facility], per [place], no transport available at this time.    Called [facility] and moade aware of transport back to [facility].

## 2016-10-31 NOTE — BH Assessment (Signed)
[place] Assessment Progress Note  Per Corena Pilgrim, MD, this pt does not require psychiatric hospitalization at this time.  Pt is to be discharged from [facility], and is to return to [place].  Megan, LCSW, has been notified, as has pt's nurse.  Jalene Mullet, [place] Triage Specialist [phone]

## 2016-11-01 ENCOUNTER — Non-Acute Institutional Stay (SKILLED_NURSING_FACILITY): Payer: Medicare Other | Admitting: Adult Health

## 2016-11-01 ENCOUNTER — Encounter: Payer: Self-pay | Admitting: Adult Health

## 2016-11-01 DIAGNOSIS — F29 Unspecified psychosis not due to a substance or known physiological condition: Secondary | ICD-10-CM | POA: Diagnosis not present

## 2016-11-01 DIAGNOSIS — F4323 Adjustment disorder with mixed anxiety and depressed mood: Secondary | ICD-10-CM
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
## 2016-11-03 ENCOUNTER — Non-Acute Institutional Stay (SKILLED_NURSING_FACILITY): Payer: Medicare Other | Admitting: Adult Health

## 2016-11-03 ENCOUNTER — Encounter: Payer: Self-pay | Admitting: Adult Health

## 2016-11-03 DIAGNOSIS — F29 Unspecified psychosis not due to a substance or known physiological condition: Secondary | ICD-10-CM

## 2016-11-03 DIAGNOSIS — F4323 Adjustment disorder with mixed anxiety and depressed mood: Secondary | ICD-10-CM | POA: Diagnosis not present
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
## 2016-11-04 DIAGNOSIS — R2689 Other abnormalities of gait and mobility: Secondary | ICD-10-CM | POA: Diagnosis not present

## 2016-11-09 DIAGNOSIS — F329 Major depressive disorder, single episode, unspecified: Secondary | ICD-10-CM | POA: Diagnosis not present

## 2016-11-09 DIAGNOSIS — F4320 Adjustment disorder, unspecified: Secondary | ICD-10-CM | POA: Diagnosis not present

## 2016-11-09 DIAGNOSIS — F29 Unspecified psychosis not due to a substance or known physiological condition: Secondary | ICD-10-CM | POA: Diagnosis not present

## 2016-11-09 DIAGNOSIS — F419 Anxiety disorder, unspecified: Secondary | ICD-10-CM | POA: Diagnosis not present

## 2016-11-15 ENCOUNTER — Other Ambulatory Visit: Payer: Self-pay

## 2016-11-15 DIAGNOSIS — F29 Unspecified psychosis not due to a substance or known physiological condition: Secondary | ICD-10-CM | POA: Diagnosis not present

## 2016-11-15 DIAGNOSIS — F4320 Adjustment disorder, unspecified: Secondary | ICD-10-CM | POA: Diagnosis not present

## 2016-11-15 DIAGNOSIS — F329 Major depressive disorder, single episode, unspecified: Secondary | ICD-10-CM | POA: Diagnosis not present

## 2016-11-15 DIAGNOSIS — F419 Anxiety disorder, unspecified: Secondary | ICD-10-CM | POA: Diagnosis not present

## 2016-11-15 DIAGNOSIS — H353221 Exudative age-related macular degeneration, left eye, with active choroidal neovascularization: Secondary | ICD-10-CM | POA: Diagnosis not present

## 2016-11-15 MED ORDER — TRAMADOL HCL 50 MG PO TABS: ORAL_TABLET | ORAL | 0 refills | Status: DC

## 2016-11-15 NOTE — Telephone Encounter (Signed)
RX faxed to [facility] @ [phone], phone number [phone] 

## 2016-11-16 ENCOUNTER — Encounter: Payer: Self-pay | Admitting: Adult Health

## 2016-11-16 ENCOUNTER — Non-Acute Institutional Stay (SKILLED_NURSING_FACILITY): Payer: Medicare Other | Admitting: Adult Health

## 2016-11-16 DIAGNOSIS — M069 Rheumatoid arthritis, unspecified: Secondary | ICD-10-CM | POA: Diagnosis not present

## 2016-11-16 DIAGNOSIS — R0902 Hypoxemia: Secondary | ICD-10-CM | POA: Diagnosis not present

## 2016-11-16 DIAGNOSIS — F4323 Adjustment disorder with mixed anxiety and depressed mood: Secondary | ICD-10-CM
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
## 2016-11-21 DIAGNOSIS — R2689 Other abnormalities of gait and mobility: Secondary | ICD-10-CM | POA: Diagnosis not present

## 2016-11-22 DIAGNOSIS — J181 Lobar pneumonia, unspecified organism: Secondary | ICD-10-CM | POA: Diagnosis not present

## 2016-11-22 DIAGNOSIS — M6281 Muscle weakness (generalized): Secondary | ICD-10-CM | POA: Diagnosis not present

## 2016-11-22 DIAGNOSIS — R1311 Dysphagia, oral phase: Secondary | ICD-10-CM | POA: Diagnosis not present

## 2016-11-23 DIAGNOSIS — J181 Lobar pneumonia, unspecified organism: Secondary | ICD-10-CM | POA: Diagnosis not present

## 2016-11-23 DIAGNOSIS — M6281 Muscle weakness (generalized): Secondary | ICD-10-CM | POA: Diagnosis not present

## 2016-11-23 DIAGNOSIS — R1311 Dysphagia, oral phase: Secondary | ICD-10-CM | POA: Diagnosis not present

## 2016-11-24 DIAGNOSIS — R1311 Dysphagia, oral phase: Secondary | ICD-10-CM | POA: Diagnosis not present

## 2016-11-24 DIAGNOSIS — M6281 Muscle weakness (generalized): Secondary | ICD-10-CM | POA: Diagnosis not present

## 2016-11-24 DIAGNOSIS — J181 Lobar pneumonia, unspecified organism: Secondary | ICD-10-CM | POA: Diagnosis not present

## 2016-11-25 ENCOUNTER — Encounter: Payer: Self-pay | Admitting: Adult Health

## 2016-11-25 ENCOUNTER — Non-Acute Institutional Stay (SKILLED_NURSING_FACILITY): Payer: Medicare Other | Admitting: Adult Health

## 2016-11-25 DIAGNOSIS — F4323 Adjustment disorder with mixed anxiety and depressed mood: Secondary | ICD-10-CM

## 2016-11-25 DIAGNOSIS — M6281 Muscle weakness (generalized): Secondary | ICD-10-CM | POA: Diagnosis not present

## 2016-11-25 DIAGNOSIS — R1311 Dysphagia, oral phase: Secondary | ICD-10-CM | POA: Diagnosis not present

## 2016-11-25 DIAGNOSIS — J181 Lobar pneumonia, unspecified organism: Secondary | ICD-10-CM | POA: Diagnosis not present
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
## 2016-11-26 DIAGNOSIS — J181 Lobar pneumonia, unspecified organism: Secondary | ICD-10-CM | POA: Diagnosis not present

## 2016-11-26 DIAGNOSIS — R1311 Dysphagia, oral phase: Secondary | ICD-10-CM | POA: Diagnosis not present

## 2016-11-26 DIAGNOSIS — M6281 Muscle weakness (generalized): Secondary | ICD-10-CM | POA: Diagnosis not present

## 2016-11-28 ENCOUNTER — Non-Acute Institutional Stay (SKILLED_NURSING_FACILITY): Payer: Medicare Other

## 2016-11-28 DIAGNOSIS — M6281 Muscle weakness (generalized): Secondary | ICD-10-CM | POA: Diagnosis not present

## 2016-11-28 DIAGNOSIS — R1311 Dysphagia, oral phase: Secondary | ICD-10-CM | POA: Diagnosis not present

## 2016-11-28 DIAGNOSIS — Z0000 Encounter for general adult medical examination without abnormal findings: Secondary | ICD-10-CM | POA: Diagnosis not present

## 2016-11-28 DIAGNOSIS — J181 Lobar pneumonia, unspecified organism: Secondary | ICD-10-CM | POA: Diagnosis not present

## 2016-11-28 NOTE — Patient Instructions (Signed)
Tracy Sharp , Thank you for taking time to come for your Medicare Wellness Visit. I appreciate your ongoing commitment to your health goals. Please review the following plan we discussed and let me know if I can assist you in the future.   Screening recommendations/referrals: Colonoscopy excluded, pt is over age 81 Mammogram excluded,pt is over age 14 Bone Density due, ordered Recommended yearly ophthalmology/optometry visit for glaucoma screening and checkup Recommended yearly dental visit for hygiene and checkup  Vaccinations: Influenza vaccine due, facility will give it Pneumococcal vaccine up to date Tdap vaccine due, not ordered facility declined Shingles vaccine not in records    Advanced directives: In Chart  Conditions/risks identified: None  Next appointment: Dr. Eulas Post will makes rounds   Preventive Care 65 Years and Older, Female Preventive care refers to lifestyle choices and visits with your health care provider that can promote health and wellness. What does preventive care include?  A yearly physical exam. This is also called an annual well check.  Dental exams once or twice a year.  Routine eye exams. Ask your health care provider how often you should have your eyes checked.  Personal lifestyle choices, including:  Daily care of your teeth and gums.  Regular physical activity.  Eating a healthy diet.  Avoiding tobacco and drug use.  Limiting alcohol use.  Practicing safe sex.  Taking low-dose aspirin every day.  Taking vitamin and mineral supplements as recommended by your health care provider. What happens during an annual well check? The services and screenings done by your health care provider during your annual well check will depend on your age, overall health, lifestyle risk factors, and family history of disease. Counseling  Your health care provider may ask you questions about your:  Alcohol use.  Tobacco use.  Drug use.  Emotional  well-being.  Home and relationship well-being.  Sexual activity.  Eating habits.  History of falls.  Memory and ability to understand (cognition).  Work and work Statistician.  Reproductive health. Screening  You may have the following tests or measurements:  Height, weight, and BMI.  Blood pressure.  Lipid and cholesterol levels. These may be checked every 5 years, or more frequently if you are over 72 years old.  Skin check.  Lung cancer screening. You may have this screening every year starting at age 69 if you have a 30-pack-year history of smoking and currently smoke or have quit within the past 15 years.  Fecal occult blood test (FOBT) of the stool. You may have this test every year starting at age 81.  Flexible sigmoidoscopy or colonoscopy. You may have a sigmoidoscopy every 5 years or a colonoscopy every 10 years starting at age 28.  Hepatitis C blood test.  Hepatitis B blood test.  Sexually transmitted disease (STD) testing.  Diabetes screening. This is done by checking your blood sugar (glucose) after you have not eaten for a while (fasting). You may have this done every 1-3 years.  Bone density scan. This is done to screen for osteoporosis. You may have this done starting at age 21.  Mammogram. This may be done every 1-2 years. Talk to your health care provider about how often you should have regular mammograms. Talk with your health care provider about your test results, treatment options, and if necessary, the need for more tests. Vaccines  Your health care provider may recommend certain vaccines, such as:  Influenza vaccine. This is recommended every year.  Tetanus, diphtheria, and acellular pertussis (Tdap, Td)  vaccine. You may need a Td booster every 10 years.  Zoster vaccine. You may need this after age 42.  Pneumococcal 13-valent conjugate (PCV13) vaccine. One dose is recommended after age 4.  Pneumococcal polysaccharide (PPSV23) vaccine. One  dose is recommended after age 2. Talk to your health care provider about which screenings and vaccines you need and how often you need them. This information is not intended to replace advice given to you by your health care provider. Make sure you discuss any questions you have with your health care provider. Document Released: 02/13/2015 Document Revised: 10/07/2015 Document Reviewed: 11/18/2014 Elsevier Interactive Patient Education  2017 [employer] Prevention in the Home Falls can cause injuries. They can happen to people of all ages. There are many things you can do to make your home safe and to help prevent falls. What can I do on the outside of my home?  Regularly fix the edges of walkways and driveways and fix any cracks.  Remove anything that might make you trip as you walk through a door, such as a raised step or threshold.  Trim any bushes or trees on the path to your home.  Use bright outdoor lighting.  Clear any walking paths of anything that might make someone trip, such as rocks or tools.  Regularly check to see if handrails are loose or broken. Make sure that both sides of any steps have handrails.  Any raised decks and porches should have guardrails on the edges.  Have any leaves, snow, or ice cleared regularly.  Use sand or salt on walking paths during winter.  Clean up any spills in your garage right away. This includes oil or grease spills. What can I do in the bathroom?  Use night lights.  Install grab bars by the toilet and in the tub and shower. Do not use towel bars as grab bars.  Use non-skid mats or decals in the tub or shower.  If you need to sit down in the shower, use a plastic, non-slip stool.  Keep the floor dry. Clean up any water that spills on the floor as soon as it happens.  Remove soap buildup in the tub or shower regularly.  Attach bath mats securely with double-sided non-slip rug tape.  Do not have throw rugs and other  things on the floor that can make you trip. What can I do in the bedroom?  Use night lights.  Make sure that you have a light by your bed that is easy to reach.  Do not use any sheets or blankets that are too big for your bed. They should not hang down onto the floor.  Have a firm chair that has side arms. You can use this for support while you get dressed.  Do not have throw rugs and other things on the floor that can make you trip. What can I do in the kitchen?  Clean up any spills right away.  Avoid walking on wet floors.  Keep items that you use a lot in easy-to-reach places.  If you need to reach something above you, use a strong step stool that has a grab bar.  Keep electrical cords out of the way.  Do not use floor polish or wax that makes floors slippery. If you must use wax, use non-skid floor wax.  Do not have throw rugs and other things on the floor that can make you trip. What can I do with my stairs?  Do not leave  any items on the stairs.  Make sure that there are handrails on both sides of the stairs and use them. Fix handrails that are broken or loose. Make sure that handrails are as long as the stairways.  Check any carpeting to make sure that it is firmly attached to the stairs. Fix any carpet that is loose or worn.  Avoid having throw rugs at the top or bottom of the stairs. If you do have throw rugs, attach them to the floor with carpet tape.  Make sure that you have a light switch at the top of the stairs and the bottom of the stairs. If you do not have them, ask someone to add them for you. What else can I do to help prevent falls?  Wear shoes that:  Do not have high heels.  Have rubber bottoms.  Are comfortable and fit you well.  Are closed at the toe. Do not wear sandals.  If you use a stepladder:  Make sure that it is fully opened. Do not climb a closed stepladder.  Make sure that both sides of the stepladder are locked into place.  Ask  someone to hold it for you, if possible.  Clearly mark and make sure that you can see:  Any grab bars or handrails.  First and last steps.  Where the edge of each step is.  Use tools that help you move around (mobility aids) if they are needed. These include:  Canes.  Walkers.  Scooters.  Crutches.  Turn on the lights when you go into a dark area. Replace any light bulbs as soon as they burn out.  Set up your furniture so you have a clear path. Avoid moving your furniture around.  If any of your floors are uneven, fix them.  If there are any pets around you, be aware of where they are.  Review your medicines with your doctor. Some medicines can make you feel dizzy. This can increase your chance of falling. Ask your doctor what other things that you can do to help prevent falls. This information is not intended to replace advice given to you by your health care provider. Make sure you discuss any questions you have with your health care provider. Document Released: 11/13/2008 Document Revised: 06/25/2015 Document Reviewed: 02/21/2014 Elsevier Interactive Patient Education  2017 [employer].
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
## 2016-11-29 ENCOUNTER — Non-Acute Institutional Stay (SKILLED_NURSING_FACILITY): Payer: Medicare Other | Admitting: Adult Health

## 2016-11-29 ENCOUNTER — Encounter: Payer: Self-pay | Admitting: Adult Health

## 2016-11-29 DIAGNOSIS — F418 Other specified anxiety disorders: Secondary | ICD-10-CM | POA: Diagnosis not present

## 2016-11-29 DIAGNOSIS — R1311 Dysphagia, oral phase: Secondary | ICD-10-CM | POA: Diagnosis not present

## 2016-11-29 DIAGNOSIS — G609 Hereditary and idiopathic neuropathy, unspecified: Secondary | ICD-10-CM

## 2016-11-29 DIAGNOSIS — M6281 Muscle weakness (generalized): Secondary | ICD-10-CM | POA: Diagnosis not present

## 2016-11-29 DIAGNOSIS — N393 Stress incontinence (female) (male): Secondary | ICD-10-CM

## 2016-11-29 DIAGNOSIS — K529 Noninfective gastroenteritis and colitis, unspecified: Secondary | ICD-10-CM | POA: Diagnosis not present

## 2016-11-29 DIAGNOSIS — J181 Lobar pneumonia, unspecified organism: Secondary | ICD-10-CM | POA: Diagnosis not present

## 2016-11-29 DIAGNOSIS — M069 Rheumatoid arthritis, unspecified: Secondary | ICD-10-CM
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
## 2016-11-30 DIAGNOSIS — M6281 Muscle weakness (generalized): Secondary | ICD-10-CM | POA: Diagnosis not present

## 2016-11-30 DIAGNOSIS — R1311 Dysphagia, oral phase: Secondary | ICD-10-CM | POA: Diagnosis not present

## 2016-11-30 DIAGNOSIS — J181 Lobar pneumonia, unspecified organism: Secondary | ICD-10-CM | POA: Diagnosis not present

## 2016-12-01 ENCOUNTER — Other Ambulatory Visit: Payer: Self-pay | Admitting: Internal Medicine

## 2016-12-01 DIAGNOSIS — M6281 Muscle weakness (generalized): Secondary | ICD-10-CM | POA: Diagnosis not present

## 2016-12-01 DIAGNOSIS — J181 Lobar pneumonia, unspecified organism: Secondary | ICD-10-CM | POA: Diagnosis not present

## 2016-12-01 DIAGNOSIS — R1311 Dysphagia, oral phase: Secondary | ICD-10-CM | POA: Diagnosis not present

## 2016-12-01 DIAGNOSIS — E2839 Other primary ovarian failure: Secondary | ICD-10-CM

## 2016-12-02 DIAGNOSIS — J181 Lobar pneumonia, unspecified organism: Secondary | ICD-10-CM | POA: Diagnosis not present

## 2016-12-02 DIAGNOSIS — M6281 Muscle weakness (generalized): Secondary | ICD-10-CM | POA: Diagnosis not present

## 2016-12-02 DIAGNOSIS — R1311 Dysphagia, oral phase: Secondary | ICD-10-CM | POA: Diagnosis not present

## 2016-12-02 DIAGNOSIS — R2689 Other abnormalities of gait and mobility: Secondary | ICD-10-CM | POA: Diagnosis not present

## 2016-12-05 DIAGNOSIS — M6281 Muscle weakness (generalized): Secondary | ICD-10-CM | POA: Diagnosis not present

## 2016-12-05 DIAGNOSIS — J181 Lobar pneumonia, unspecified organism: Secondary | ICD-10-CM | POA: Diagnosis not present

## 2016-12-05 DIAGNOSIS — R1311 Dysphagia, oral phase: Secondary | ICD-10-CM | POA: Diagnosis not present

## 2016-12-06 DIAGNOSIS — M6281 Muscle weakness (generalized): Secondary | ICD-10-CM | POA: Diagnosis not present

## 2016-12-06 DIAGNOSIS — J181 Lobar pneumonia, unspecified organism: Secondary | ICD-10-CM | POA: Diagnosis not present

## 2016-12-06 DIAGNOSIS — R1311 Dysphagia, oral phase: Secondary | ICD-10-CM | POA: Diagnosis not present

## 2016-12-07 DIAGNOSIS — J181 Lobar pneumonia, unspecified organism: Secondary | ICD-10-CM | POA: Diagnosis not present

## 2016-12-07 DIAGNOSIS — M6281 Muscle weakness (generalized): Secondary | ICD-10-CM | POA: Diagnosis not present

## 2016-12-07 DIAGNOSIS — R1311 Dysphagia, oral phase: Secondary | ICD-10-CM | POA: Diagnosis not present

## 2016-12-08 DIAGNOSIS — J181 Lobar pneumonia, unspecified organism: Secondary | ICD-10-CM | POA: Diagnosis not present

## 2016-12-08 DIAGNOSIS — M6281 Muscle weakness (generalized): Secondary | ICD-10-CM | POA: Diagnosis not present

## 2016-12-08 DIAGNOSIS — R1311 Dysphagia, oral phase: Secondary | ICD-10-CM | POA: Diagnosis not present

## 2016-12-09 DIAGNOSIS — J181 Lobar pneumonia, unspecified organism: Secondary | ICD-10-CM | POA: Diagnosis not present

## 2016-12-09 DIAGNOSIS — R1311 Dysphagia, oral phase: Secondary | ICD-10-CM | POA: Diagnosis not present

## 2016-12-09 DIAGNOSIS — M6281 Muscle weakness (generalized): Secondary | ICD-10-CM | POA: Diagnosis not present

## 2016-12-12 DIAGNOSIS — J181 Lobar pneumonia, unspecified organism: Secondary | ICD-10-CM | POA: Diagnosis not present

## 2016-12-12 DIAGNOSIS — M79675 Pain in left toe(s): Secondary | ICD-10-CM | POA: Diagnosis not present

## 2016-12-12 DIAGNOSIS — B351 Tinea unguium: Secondary | ICD-10-CM | POA: Diagnosis not present

## 2016-12-12 DIAGNOSIS — M79674 Pain in right toe(s): Secondary | ICD-10-CM | POA: Diagnosis not present

## 2016-12-12 DIAGNOSIS — M6281 Muscle weakness (generalized): Secondary | ICD-10-CM | POA: Diagnosis not present

## 2016-12-12 DIAGNOSIS — R1311 Dysphagia, oral phase: Secondary | ICD-10-CM | POA: Diagnosis not present

## 2016-12-13 ENCOUNTER — Non-Acute Institutional Stay (SKILLED_NURSING_FACILITY): Payer: Medicare Other | Admitting: Adult Health

## 2016-12-13 ENCOUNTER — Encounter: Payer: Self-pay | Admitting: Adult Health

## 2016-12-13 DIAGNOSIS — M6281 Muscle weakness (generalized): Secondary | ICD-10-CM | POA: Diagnosis not present

## 2016-12-13 DIAGNOSIS — B029 Zoster without complications: Secondary | ICD-10-CM

## 2016-12-13 DIAGNOSIS — L03211 Cellulitis of face: Secondary | ICD-10-CM | POA: Diagnosis not present

## 2016-12-13 DIAGNOSIS — R1311 Dysphagia, oral phase: Secondary | ICD-10-CM | POA: Diagnosis not present

## 2016-12-13 DIAGNOSIS — J181 Lobar pneumonia, unspecified organism: Secondary | ICD-10-CM | POA: Diagnosis not present
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
## 2016-12-14 DIAGNOSIS — R2689 Other abnormalities of gait and mobility: Secondary | ICD-10-CM | POA: Diagnosis not present

## 2016-12-15 DIAGNOSIS — F329 Major depressive disorder, single episode, unspecified: Secondary | ICD-10-CM | POA: Diagnosis not present

## 2016-12-15 DIAGNOSIS — G4700 Insomnia, unspecified: Secondary | ICD-10-CM | POA: Diagnosis not present

## 2016-12-15 DIAGNOSIS — M6281 Muscle weakness (generalized): Secondary | ICD-10-CM | POA: Diagnosis not present

## 2016-12-15 DIAGNOSIS — R1311 Dysphagia, oral phase: Secondary | ICD-10-CM | POA: Diagnosis not present

## 2016-12-15 DIAGNOSIS — J181 Lobar pneumonia, unspecified organism: Secondary | ICD-10-CM | POA: Diagnosis not present

## 2016-12-15 DIAGNOSIS — F419 Anxiety disorder, unspecified: Secondary | ICD-10-CM | POA: Diagnosis not present

## 2016-12-15 DIAGNOSIS — F29 Unspecified psychosis not due to a substance or known physiological condition: Secondary | ICD-10-CM | POA: Diagnosis not present

## 2016-12-17 DIAGNOSIS — R1311 Dysphagia, oral phase: Secondary | ICD-10-CM | POA: Diagnosis not present

## 2016-12-17 DIAGNOSIS — J181 Lobar pneumonia, unspecified organism: Secondary | ICD-10-CM | POA: Diagnosis not present

## 2016-12-17 DIAGNOSIS — M6281 Muscle weakness (generalized): Secondary | ICD-10-CM | POA: Diagnosis not present

## 2016-12-18 DIAGNOSIS — N393 Stress incontinence (female) (male): Secondary | ICD-10-CM | POA: Insufficient documentation

## 2016-12-19 DIAGNOSIS — J181 Lobar pneumonia, unspecified organism: Secondary | ICD-10-CM | POA: Diagnosis not present

## 2016-12-19 DIAGNOSIS — R1311 Dysphagia, oral phase: Secondary | ICD-10-CM | POA: Diagnosis not present

## 2016-12-19 DIAGNOSIS — M6281 Muscle weakness (generalized): Secondary | ICD-10-CM | POA: Diagnosis not present

## 2016-12-20 DIAGNOSIS — M6281 Muscle weakness (generalized): Secondary | ICD-10-CM | POA: Diagnosis not present

## 2016-12-20 DIAGNOSIS — R1311 Dysphagia, oral phase: Secondary | ICD-10-CM | POA: Diagnosis not present

## 2016-12-20 DIAGNOSIS — J181 Lobar pneumonia, unspecified organism: Secondary | ICD-10-CM | POA: Diagnosis not present

## 2016-12-21 DIAGNOSIS — R1311 Dysphagia, oral phase: Secondary | ICD-10-CM | POA: Diagnosis not present

## 2016-12-21 DIAGNOSIS — J181 Lobar pneumonia, unspecified organism: Secondary | ICD-10-CM | POA: Diagnosis not present

## 2016-12-21 DIAGNOSIS — M6281 Muscle weakness (generalized): Secondary | ICD-10-CM | POA: Diagnosis not present

## 2016-12-22 DIAGNOSIS — J181 Lobar pneumonia, unspecified organism: Secondary | ICD-10-CM | POA: Diagnosis not present

## 2016-12-22 DIAGNOSIS — M6281 Muscle weakness (generalized): Secondary | ICD-10-CM | POA: Diagnosis not present

## 2016-12-22 DIAGNOSIS — R1311 Dysphagia, oral phase: Secondary | ICD-10-CM | POA: Diagnosis not present

## 2016-12-23 DIAGNOSIS — M6281 Muscle weakness (generalized): Secondary | ICD-10-CM | POA: Diagnosis not present

## 2016-12-23 DIAGNOSIS — J181 Lobar pneumonia, unspecified organism: Secondary | ICD-10-CM | POA: Diagnosis not present

## 2016-12-23 DIAGNOSIS — R1311 Dysphagia, oral phase: Secondary | ICD-10-CM | POA: Diagnosis not present

## 2016-12-25 DIAGNOSIS — B029 Zoster without complications: Secondary | ICD-10-CM | POA: Insufficient documentation

## 2016-12-25 DIAGNOSIS — L03211 Cellulitis of face: Secondary | ICD-10-CM | POA: Insufficient documentation

## 2016-12-26 ENCOUNTER — Other Ambulatory Visit: Payer: Self-pay | Admitting: *Deleted

## 2016-12-26 ENCOUNTER — Other Ambulatory Visit: Payer: Self-pay | Admitting: Internal Medicine

## 2016-12-26 DIAGNOSIS — R1311 Dysphagia, oral phase: Secondary | ICD-10-CM | POA: Diagnosis not present

## 2016-12-26 DIAGNOSIS — J181 Lobar pneumonia, unspecified organism: Secondary | ICD-10-CM | POA: Diagnosis not present

## 2016-12-26 DIAGNOSIS — E2839 Other primary ovarian failure: Secondary | ICD-10-CM

## 2016-12-26 DIAGNOSIS — M6281 Muscle weakness (generalized): Secondary | ICD-10-CM | POA: Diagnosis not present

## 2016-12-27 ENCOUNTER — Non-Acute Institutional Stay (SKILLED_NURSING_FACILITY): Payer: Medicare Other | Admitting: Adult Health

## 2016-12-27 ENCOUNTER — Encounter: Payer: Self-pay | Admitting: Adult Health

## 2016-12-27 DIAGNOSIS — M6281 Muscle weakness (generalized): Secondary | ICD-10-CM | POA: Diagnosis not present

## 2016-12-27 DIAGNOSIS — F418 Other specified anxiety disorders: Secondary | ICD-10-CM | POA: Diagnosis not present

## 2016-12-27 DIAGNOSIS — J181 Lobar pneumonia, unspecified organism: Secondary | ICD-10-CM | POA: Diagnosis not present

## 2016-12-27 DIAGNOSIS — H353221 Exudative age-related macular degeneration, left eye, with active choroidal neovascularization: Secondary | ICD-10-CM | POA: Diagnosis not present

## 2016-12-27 DIAGNOSIS — F209 Schizophrenia, unspecified: Secondary | ICD-10-CM | POA: Diagnosis not present

## 2016-12-27 DIAGNOSIS — R1311 Dysphagia, oral phase: Secondary | ICD-10-CM | POA: Diagnosis not present
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
## 2016-12-28 ENCOUNTER — Ambulatory Visit
Admission: RE | Admit: 2016-12-28 | Discharge: 2016-12-28 | Disposition: A | Discharge status: Home / Self Care | Payer: Medicare Other | Source: Ambulatory Visit | Attending: Internal Medicine | Admitting: Internal Medicine

## 2016-12-28 DIAGNOSIS — Z780 Asymptomatic menopausal state: Secondary | ICD-10-CM | POA: Diagnosis not present

## 2016-12-28 DIAGNOSIS — J181 Lobar pneumonia, unspecified organism: Secondary | ICD-10-CM | POA: Diagnosis not present

## 2016-12-28 DIAGNOSIS — M6281 Muscle weakness (generalized): Secondary | ICD-10-CM | POA: Diagnosis not present

## 2016-12-28 DIAGNOSIS — R1311 Dysphagia, oral phase: Secondary | ICD-10-CM | POA: Diagnosis not present

## 2016-12-28 DIAGNOSIS — M810 Age-related osteoporosis without current pathological fracture: Secondary | ICD-10-CM | POA: Diagnosis not present

## 2016-12-28 DIAGNOSIS — E2839 Other primary ovarian failure: Secondary | ICD-10-CM

## 2016-12-29 ENCOUNTER — Non-Acute Institutional Stay (SKILLED_NURSING_FACILITY): Payer: Medicare Other | Admitting: Adult Health

## 2016-12-29 ENCOUNTER — Encounter: Payer: Self-pay | Admitting: Adult Health

## 2016-12-29 DIAGNOSIS — M79604 Pain in right leg: Secondary | ICD-10-CM | POA: Diagnosis not present

## 2016-12-29 DIAGNOSIS — M79605 Pain in left leg: Secondary | ICD-10-CM | POA: Diagnosis not present

## 2016-12-29 DIAGNOSIS — M6281 Muscle weakness (generalized): Secondary | ICD-10-CM | POA: Diagnosis not present

## 2016-12-29 DIAGNOSIS — M810 Age-related osteoporosis without current pathological fracture: Secondary | ICD-10-CM | POA: Diagnosis not present

## 2016-12-29 DIAGNOSIS — F209 Schizophrenia, unspecified: Secondary | ICD-10-CM | POA: Diagnosis not present

## 2016-12-29 DIAGNOSIS — M159 Polyosteoarthritis, unspecified: Secondary | ICD-10-CM | POA: Diagnosis not present

## 2016-12-29 DIAGNOSIS — J181 Lobar pneumonia, unspecified organism: Secondary | ICD-10-CM | POA: Diagnosis not present

## 2016-12-29 DIAGNOSIS — G8929 Other chronic pain: Secondary | ICD-10-CM

## 2016-12-29 DIAGNOSIS — R1311 Dysphagia, oral phase: Secondary | ICD-10-CM | POA: Diagnosis not present
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
## 2016-12-30 DIAGNOSIS — J181 Lobar pneumonia, unspecified organism: Secondary | ICD-10-CM | POA: Diagnosis not present

## 2016-12-30 DIAGNOSIS — R2689 Other abnormalities of gait and mobility: Secondary | ICD-10-CM | POA: Diagnosis not present

## 2016-12-30 DIAGNOSIS — M25561 Pain in right knee: Secondary | ICD-10-CM | POA: Diagnosis not present

## 2016-12-30 DIAGNOSIS — R1311 Dysphagia, oral phase: Secondary | ICD-10-CM | POA: Diagnosis not present

## 2016-12-30 DIAGNOSIS — M25562 Pain in left knee: Secondary | ICD-10-CM | POA: Diagnosis not present

## 2016-12-30 DIAGNOSIS — M6281 Muscle weakness (generalized): Secondary | ICD-10-CM | POA: Diagnosis not present

## 2017-01-02 DIAGNOSIS — J181 Lobar pneumonia, unspecified organism: Secondary | ICD-10-CM | POA: Diagnosis not present

## 2017-01-02 DIAGNOSIS — M6281 Muscle weakness (generalized): Secondary | ICD-10-CM | POA: Diagnosis not present

## 2017-01-02 DIAGNOSIS — R1311 Dysphagia, oral phase: Secondary | ICD-10-CM | POA: Diagnosis not present

## 2017-01-02 DIAGNOSIS — R2689 Other abnormalities of gait and mobility: Secondary | ICD-10-CM | POA: Diagnosis not present

## 2017-01-03 DIAGNOSIS — J181 Lobar pneumonia, unspecified organism: Secondary | ICD-10-CM | POA: Diagnosis not present

## 2017-01-03 DIAGNOSIS — R2689 Other abnormalities of gait and mobility: Secondary | ICD-10-CM | POA: Diagnosis not present

## 2017-01-03 DIAGNOSIS — R1311 Dysphagia, oral phase: Secondary | ICD-10-CM | POA: Diagnosis not present

## 2017-01-03 DIAGNOSIS — M6281 Muscle weakness (generalized): Secondary | ICD-10-CM | POA: Diagnosis not present

## 2017-01-04 DIAGNOSIS — F419 Anxiety disorder, unspecified: Secondary | ICD-10-CM | POA: Diagnosis not present

## 2017-01-04 DIAGNOSIS — R1311 Dysphagia, oral phase: Secondary | ICD-10-CM | POA: Diagnosis not present

## 2017-01-04 DIAGNOSIS — F329 Major depressive disorder, single episode, unspecified: Secondary | ICD-10-CM | POA: Diagnosis not present

## 2017-01-04 DIAGNOSIS — M6281 Muscle weakness (generalized): Secondary | ICD-10-CM | POA: Diagnosis not present

## 2017-01-04 DIAGNOSIS — F29 Unspecified psychosis not due to a substance or known physiological condition: Secondary | ICD-10-CM | POA: Diagnosis not present

## 2017-01-04 DIAGNOSIS — G4700 Insomnia, unspecified: Secondary | ICD-10-CM | POA: Diagnosis not present

## 2017-01-04 DIAGNOSIS — R2689 Other abnormalities of gait and mobility: Secondary | ICD-10-CM | POA: Diagnosis not present

## 2017-01-04 DIAGNOSIS — J181 Lobar pneumonia, unspecified organism: Secondary | ICD-10-CM | POA: Diagnosis not present

## 2017-01-05 DIAGNOSIS — R2689 Other abnormalities of gait and mobility: Secondary | ICD-10-CM | POA: Diagnosis not present

## 2017-01-05 DIAGNOSIS — M25561 Pain in right knee: Secondary | ICD-10-CM | POA: Diagnosis not present

## 2017-01-05 DIAGNOSIS — M6281 Muscle weakness (generalized): Secondary | ICD-10-CM | POA: Diagnosis not present

## 2017-01-05 DIAGNOSIS — J181 Lobar pneumonia, unspecified organism: Secondary | ICD-10-CM | POA: Diagnosis not present

## 2017-01-05 DIAGNOSIS — R1311 Dysphagia, oral phase: Secondary | ICD-10-CM | POA: Diagnosis not present

## 2017-01-05 DIAGNOSIS — M25562 Pain in left knee: Secondary | ICD-10-CM | POA: Diagnosis not present

## 2017-01-06 DIAGNOSIS — J181 Lobar pneumonia, unspecified organism: Secondary | ICD-10-CM | POA: Diagnosis not present

## 2017-01-06 DIAGNOSIS — R2689 Other abnormalities of gait and mobility: Secondary | ICD-10-CM | POA: Diagnosis not present

## 2017-01-06 DIAGNOSIS — M6281 Muscle weakness (generalized): Secondary | ICD-10-CM | POA: Diagnosis not present

## 2017-01-06 DIAGNOSIS — R1311 Dysphagia, oral phase: Secondary | ICD-10-CM | POA: Diagnosis not present

## 2017-01-09 DIAGNOSIS — M6281 Muscle weakness (generalized): Secondary | ICD-10-CM | POA: Diagnosis not present

## 2017-01-09 DIAGNOSIS — R1311 Dysphagia, oral phase: Secondary | ICD-10-CM | POA: Diagnosis not present

## 2017-01-09 DIAGNOSIS — J181 Lobar pneumonia, unspecified organism: Secondary | ICD-10-CM | POA: Diagnosis not present

## 2017-01-09 DIAGNOSIS — R2689 Other abnormalities of gait and mobility: Secondary | ICD-10-CM | POA: Diagnosis not present

## 2017-01-10 DIAGNOSIS — R1311 Dysphagia, oral phase: Secondary | ICD-10-CM | POA: Diagnosis not present

## 2017-01-10 DIAGNOSIS — R2689 Other abnormalities of gait and mobility: Secondary | ICD-10-CM | POA: Diagnosis not present

## 2017-01-10 DIAGNOSIS — J181 Lobar pneumonia, unspecified organism: Secondary | ICD-10-CM | POA: Diagnosis not present

## 2017-01-10 DIAGNOSIS — M6281 Muscle weakness (generalized): Secondary | ICD-10-CM | POA: Diagnosis not present

## 2017-01-11 ENCOUNTER — Encounter: Payer: Self-pay | Admitting: Adult Health

## 2017-01-11 DIAGNOSIS — M6281 Muscle weakness (generalized): Secondary | ICD-10-CM | POA: Diagnosis not present

## 2017-01-11 DIAGNOSIS — J181 Lobar pneumonia, unspecified organism: Secondary | ICD-10-CM | POA: Diagnosis not present

## 2017-01-11 DIAGNOSIS — R1311 Dysphagia, oral phase: Secondary | ICD-10-CM | POA: Diagnosis not present

## 2017-01-11 DIAGNOSIS — R2689 Other abnormalities of gait and mobility: Secondary | ICD-10-CM | POA: Diagnosis not present

## 2017-01-11 NOTE — Progress Notes (Signed)
This encounter was created in error - please disregard.
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
## 2017-01-12 DIAGNOSIS — R2689 Other abnormalities of gait and mobility: Secondary | ICD-10-CM | POA: Diagnosis not present

## 2017-01-12 DIAGNOSIS — M6281 Muscle weakness (generalized): Secondary | ICD-10-CM | POA: Diagnosis not present

## 2017-01-12 DIAGNOSIS — M25562 Pain in left knee: Secondary | ICD-10-CM | POA: Diagnosis not present

## 2017-01-12 DIAGNOSIS — R1311 Dysphagia, oral phase: Secondary | ICD-10-CM | POA: Diagnosis not present

## 2017-01-12 DIAGNOSIS — M25561 Pain in right knee: Secondary | ICD-10-CM | POA: Diagnosis not present

## 2017-01-12 DIAGNOSIS — J181 Lobar pneumonia, unspecified organism: Secondary | ICD-10-CM | POA: Diagnosis not present

## 2017-01-13 DIAGNOSIS — M6281 Muscle weakness (generalized): Secondary | ICD-10-CM | POA: Diagnosis not present

## 2017-01-13 DIAGNOSIS — R1311 Dysphagia, oral phase: Secondary | ICD-10-CM | POA: Diagnosis not present

## 2017-01-13 DIAGNOSIS — R2689 Other abnormalities of gait and mobility: Secondary | ICD-10-CM | POA: Diagnosis not present

## 2017-01-13 DIAGNOSIS — J181 Lobar pneumonia, unspecified organism: Secondary | ICD-10-CM | POA: Diagnosis not present

## 2017-01-16 DIAGNOSIS — J181 Lobar pneumonia, unspecified organism: Secondary | ICD-10-CM | POA: Diagnosis not present

## 2017-01-16 DIAGNOSIS — R1311 Dysphagia, oral phase: Secondary | ICD-10-CM | POA: Diagnosis not present

## 2017-01-16 DIAGNOSIS — R2689 Other abnormalities of gait and mobility: Secondary | ICD-10-CM | POA: Diagnosis not present

## 2017-01-16 DIAGNOSIS — M6281 Muscle weakness (generalized): Secondary | ICD-10-CM | POA: Diagnosis not present

## 2017-01-17 ENCOUNTER — Other Ambulatory Visit: Payer: Self-pay

## 2017-01-17 DIAGNOSIS — J181 Lobar pneumonia, unspecified organism: Secondary | ICD-10-CM | POA: Diagnosis not present

## 2017-01-17 DIAGNOSIS — R2689 Other abnormalities of gait and mobility: Secondary | ICD-10-CM | POA: Diagnosis not present

## 2017-01-17 DIAGNOSIS — M6281 Muscle weakness (generalized): Secondary | ICD-10-CM | POA: Diagnosis not present

## 2017-01-17 DIAGNOSIS — R1311 Dysphagia, oral phase: Secondary | ICD-10-CM | POA: Diagnosis not present

## 2017-01-17 MED ORDER — TRAMADOL HCL 50 MG PO TABS: ORAL_TABLET | ORAL | 0 refills | Status: DC

## 2017-01-17 NOTE — Telephone Encounter (Signed)
RX faxed to [facility] @ [phone], phone number [phone] 

## 2017-01-18 DIAGNOSIS — M6281 Muscle weakness (generalized): Secondary | ICD-10-CM | POA: Diagnosis not present

## 2017-01-18 DIAGNOSIS — J181 Lobar pneumonia, unspecified organism: Secondary | ICD-10-CM | POA: Diagnosis not present

## 2017-01-18 DIAGNOSIS — R1311 Dysphagia, oral phase: Secondary | ICD-10-CM | POA: Diagnosis not present

## 2017-01-18 DIAGNOSIS — R2689 Other abnormalities of gait and mobility: Secondary | ICD-10-CM | POA: Diagnosis not present

## 2017-01-19 DIAGNOSIS — R2689 Other abnormalities of gait and mobility: Secondary | ICD-10-CM | POA: Diagnosis not present

## 2017-01-19 DIAGNOSIS — R1311 Dysphagia, oral phase: Secondary | ICD-10-CM | POA: Diagnosis not present

## 2017-01-19 DIAGNOSIS — M6281 Muscle weakness (generalized): Secondary | ICD-10-CM | POA: Diagnosis not present

## 2017-01-19 DIAGNOSIS — J181 Lobar pneumonia, unspecified organism: Secondary | ICD-10-CM | POA: Diagnosis not present

## 2017-01-20 DIAGNOSIS — J181 Lobar pneumonia, unspecified organism: Secondary | ICD-10-CM | POA: Diagnosis not present

## 2017-01-20 DIAGNOSIS — M6281 Muscle weakness (generalized): Secondary | ICD-10-CM | POA: Diagnosis not present

## 2017-01-20 DIAGNOSIS — R2689 Other abnormalities of gait and mobility: Secondary | ICD-10-CM | POA: Diagnosis not present

## 2017-01-20 DIAGNOSIS — R1311 Dysphagia, oral phase: Secondary | ICD-10-CM | POA: Diagnosis not present

## 2017-01-22 DIAGNOSIS — R1311 Dysphagia, oral phase: Secondary | ICD-10-CM | POA: Diagnosis not present

## 2017-01-22 DIAGNOSIS — R2689 Other abnormalities of gait and mobility: Secondary | ICD-10-CM | POA: Diagnosis not present

## 2017-01-22 DIAGNOSIS — J181 Lobar pneumonia, unspecified organism: Secondary | ICD-10-CM | POA: Diagnosis not present

## 2017-01-22 DIAGNOSIS — M6281 Muscle weakness (generalized): Secondary | ICD-10-CM | POA: Diagnosis not present

## 2017-01-23 DIAGNOSIS — R1311 Dysphagia, oral phase: Secondary | ICD-10-CM | POA: Diagnosis not present

## 2017-01-23 DIAGNOSIS — R2689 Other abnormalities of gait and mobility: Secondary | ICD-10-CM | POA: Diagnosis not present

## 2017-01-23 DIAGNOSIS — M6281 Muscle weakness (generalized): Secondary | ICD-10-CM | POA: Diagnosis not present

## 2017-01-23 DIAGNOSIS — J181 Lobar pneumonia, unspecified organism: Secondary | ICD-10-CM | POA: Diagnosis not present

## 2017-01-24 DIAGNOSIS — R1311 Dysphagia, oral phase: Secondary | ICD-10-CM | POA: Diagnosis not present

## 2017-01-24 DIAGNOSIS — J181 Lobar pneumonia, unspecified organism: Secondary | ICD-10-CM | POA: Diagnosis not present

## 2017-01-24 DIAGNOSIS — M6281 Muscle weakness (generalized): Secondary | ICD-10-CM | POA: Diagnosis not present

## 2017-01-24 DIAGNOSIS — R2689 Other abnormalities of gait and mobility: Secondary | ICD-10-CM | POA: Diagnosis not present

## 2017-01-25 DIAGNOSIS — R2689 Other abnormalities of gait and mobility: Secondary | ICD-10-CM | POA: Diagnosis not present

## 2017-01-25 DIAGNOSIS — R1311 Dysphagia, oral phase: Secondary | ICD-10-CM | POA: Diagnosis not present

## 2017-01-25 DIAGNOSIS — M6281 Muscle weakness (generalized): Secondary | ICD-10-CM | POA: Diagnosis not present

## 2017-01-25 DIAGNOSIS — J181 Lobar pneumonia, unspecified organism: Secondary | ICD-10-CM | POA: Diagnosis not present

## 2017-01-26 ENCOUNTER — Encounter: Payer: Self-pay | Admitting: Adult Health

## 2017-01-26 ENCOUNTER — Non-Acute Institutional Stay (SKILLED_NURSING_FACILITY): Payer: Medicare Other | Admitting: Adult Health

## 2017-01-26 DIAGNOSIS — F418 Other specified anxiety disorders: Secondary | ICD-10-CM

## 2017-01-26 DIAGNOSIS — R0902 Hypoxemia: Secondary | ICD-10-CM | POA: Diagnosis not present

## 2017-01-26 DIAGNOSIS — K529 Noninfective gastroenteritis and colitis, unspecified: Secondary | ICD-10-CM | POA: Diagnosis not present

## 2017-01-26 DIAGNOSIS — R1311 Dysphagia, oral phase: Secondary | ICD-10-CM | POA: Diagnosis not present

## 2017-01-26 DIAGNOSIS — I10 Essential (primary) hypertension: Secondary | ICD-10-CM | POA: Diagnosis not present

## 2017-01-26 DIAGNOSIS — J181 Lobar pneumonia, unspecified organism: Secondary | ICD-10-CM | POA: Diagnosis not present

## 2017-01-26 DIAGNOSIS — M6281 Muscle weakness (generalized): Secondary | ICD-10-CM | POA: Diagnosis not present

## 2017-01-26 DIAGNOSIS — R2689 Other abnormalities of gait and mobility: Secondary | ICD-10-CM | POA: Diagnosis not present
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
## 2017-01-27 DIAGNOSIS — I10 Essential (primary) hypertension: Secondary | ICD-10-CM | POA: Diagnosis not present

## 2017-01-27 DIAGNOSIS — R2689 Other abnormalities of gait and mobility: Secondary | ICD-10-CM | POA: Diagnosis not present

## 2017-01-27 DIAGNOSIS — J181 Lobar pneumonia, unspecified organism: Secondary | ICD-10-CM | POA: Diagnosis not present

## 2017-01-27 DIAGNOSIS — M6281 Muscle weakness (generalized): Secondary | ICD-10-CM | POA: Diagnosis not present

## 2017-01-27 DIAGNOSIS — R1311 Dysphagia, oral phase: Secondary | ICD-10-CM | POA: Diagnosis not present

## 2017-01-27 DIAGNOSIS — D649 Anemia, unspecified: Secondary | ICD-10-CM | POA: Diagnosis not present

## 2017-01-27 LAB — CBC AND DIFFERENTIAL
HCT: 34 — AB (ref 36–46)
Hemoglobin: 11.6 — AB (ref 12.0–16.0)
PLATELETS: 162 (ref 150–399)
WBC: 5.7

## 2017-01-27 LAB — BASIC METABOLIC PANEL WITH GFR
BUN: 18 (ref 4–21)
Creatinine: 0.6 (ref 0.5–1.1)
Potassium: 4.6 (ref 3.4–5.3)
Sodium: 144 (ref 137–147)

## 2017-01-27 LAB — HEPATIC FUNCTION PANEL
ALT: 8 (ref 7–35)
AST: 11 — AB (ref 13–35)
Alkaline Phosphatase: 67 (ref 25–125)
Bilirubin, Total: 0.2

## 2017-01-28 DIAGNOSIS — M6281 Muscle weakness (generalized): Secondary | ICD-10-CM | POA: Diagnosis not present

## 2017-01-28 DIAGNOSIS — J181 Lobar pneumonia, unspecified organism: Secondary | ICD-10-CM | POA: Diagnosis not present

## 2017-01-28 DIAGNOSIS — R2689 Other abnormalities of gait and mobility: Secondary | ICD-10-CM | POA: Diagnosis not present

## 2017-01-28 DIAGNOSIS — R1311 Dysphagia, oral phase: Secondary | ICD-10-CM | POA: Diagnosis not present

## 2017-01-30 DIAGNOSIS — M6281 Muscle weakness (generalized): Secondary | ICD-10-CM | POA: Diagnosis not present

## 2017-01-30 DIAGNOSIS — R2689 Other abnormalities of gait and mobility: Secondary | ICD-10-CM | POA: Diagnosis not present

## 2017-01-30 DIAGNOSIS — R1311 Dysphagia, oral phase: Secondary | ICD-10-CM | POA: Diagnosis not present

## 2017-01-30 DIAGNOSIS — J181 Lobar pneumonia, unspecified organism: Secondary | ICD-10-CM | POA: Diagnosis not present

## 2017-02-28 LAB — BASIC METABOLIC PANEL
BUN: 14 (ref 4–21)
CREATININE: 0.5 (ref 0.5–1.1)
Glucose: 149
Potassium: 4.1 (ref 3.4–5.3)
Sodium: 141 (ref 137–147)

## 2017-02-28 LAB — HEPATIC FUNCTION PANEL
ALT: 5 — AB (ref 7–35)
AST: 8 — AB (ref 13–35)
Alkaline Phosphatase: 87 (ref 25–125)
BILIRUBIN, TOTAL: 0.4

## 2017-02-28 LAB — VITAMIN D 25 HYDROXY (VIT D DEFICIENCY, FRACTURES): VIT D 25 HYDROXY: 31.73

## 2017-02-28 LAB — CBC AND DIFFERENTIAL
HCT: 35 — AB (ref 36–46)
HEMOGLOBIN: 11.7 — AB (ref 12.0–16.0)
Platelets: 193 (ref 150–399)
WBC: 12.9

## 2017-02-28 LAB — LIPID PANEL
CHOLESTEROL: 135 (ref 0–200)
HDL: 49 (ref 35–70)
LDL Cholesterol: 69
Triglycerides: 83 (ref 40–160)

## 2017-03-30 ENCOUNTER — Non-Acute Institutional Stay (SKILLED_NURSING_FACILITY): Payer: Medicare Other | Admitting: Internal Medicine

## 2017-03-30 ENCOUNTER — Encounter: Payer: Self-pay | Admitting: Internal Medicine

## 2017-03-30 DIAGNOSIS — E441 Mild protein-calorie malnutrition: Secondary | ICD-10-CM | POA: Diagnosis not present

## 2017-03-30 DIAGNOSIS — M069 Rheumatoid arthritis, unspecified: Secondary | ICD-10-CM

## 2017-03-30 DIAGNOSIS — M159 Polyosteoarthritis, unspecified: Secondary | ICD-10-CM | POA: Diagnosis not present

## 2017-03-30 DIAGNOSIS — I10 Essential (primary) hypertension: Secondary | ICD-10-CM

## 2017-03-30 DIAGNOSIS — F209 Schizophrenia, unspecified: Secondary | ICD-10-CM | POA: Diagnosis not present

## 2017-03-30 DIAGNOSIS — R0902 Hypoxemia: Secondary | ICD-10-CM | POA: Diagnosis not present
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
## 2017-04-03 ENCOUNTER — Encounter: Payer: Self-pay | Admitting: Internal Medicine

## 2017-05-08 ENCOUNTER — Non-Acute Institutional Stay (SKILLED_NURSING_FACILITY): Payer: Medicare Other | Admitting: Internal Medicine

## 2017-05-08 ENCOUNTER — Encounter: Payer: Self-pay | Admitting: Internal Medicine

## 2017-05-08 DIAGNOSIS — F209 Schizophrenia, unspecified: Secondary | ICD-10-CM | POA: Diagnosis not present

## 2017-05-08 DIAGNOSIS — H3560 Retinal hemorrhage, unspecified eye: Secondary | ICD-10-CM | POA: Diagnosis not present

## 2017-05-08 DIAGNOSIS — M159 Polyosteoarthritis, unspecified: Secondary | ICD-10-CM

## 2017-05-08 DIAGNOSIS — M069 Rheumatoid arthritis, unspecified: Secondary | ICD-10-CM
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
## 2017-05-12 ENCOUNTER — Telehealth: Payer: Self-pay | Admitting: Internal Medicine

## 2017-05-12 ENCOUNTER — Inpatient Hospital Stay (HOSPITAL_COMMUNITY)
Admission: EM | Admit: 2017-05-12 | Discharge: 2017-05-17 | DRG: 378 | Disposition: A | Discharge status: Skilled Nursing Facility | Payer: Medicare Other | Attending: Family Medicine | Admitting: Family Medicine

## 2017-05-12 ENCOUNTER — Encounter (HOSPITAL_COMMUNITY): Payer: Self-pay | Admitting: Emergency Medicine

## 2017-05-12 ENCOUNTER — Emergency Department (HOSPITAL_COMMUNITY): Payer: Medicare Other

## 2017-05-12 DIAGNOSIS — K5733 Diverticulitis of large intestine without perforation or abscess with bleeding: Secondary | ICD-10-CM | POA: Diagnosis not present

## 2017-05-12 DIAGNOSIS — I808 Phlebitis and thrombophlebitis of other sites: Secondary | ICD-10-CM | POA: Diagnosis not present

## 2017-05-12 DIAGNOSIS — H9190 Unspecified hearing loss, unspecified ear: Secondary | ICD-10-CM | POA: Diagnosis present

## 2017-05-12 DIAGNOSIS — N3946 Mixed incontinence: Secondary | ICD-10-CM | POA: Diagnosis present

## 2017-05-12 DIAGNOSIS — I350 Nonrheumatic aortic (valve) stenosis: Secondary | ICD-10-CM | POA: Diagnosis present

## 2017-05-12 DIAGNOSIS — G629 Polyneuropathy, unspecified: Secondary | ICD-10-CM | POA: Diagnosis present

## 2017-05-12 DIAGNOSIS — K5791 Diverticulosis of intestine, part unspecified, without perforation or abscess with bleeding: Secondary | ICD-10-CM

## 2017-05-12 DIAGNOSIS — I10 Essential (primary) hypertension: Secondary | ICD-10-CM | POA: Diagnosis present

## 2017-05-12 DIAGNOSIS — Z885 Allergy status to narcotic agent status: Secondary | ICD-10-CM

## 2017-05-12 DIAGNOSIS — L539 Erythematous condition, unspecified: Secondary | ICD-10-CM

## 2017-05-12 DIAGNOSIS — Z972 Presence of dental prosthetic device (complete) (partial): Secondary | ICD-10-CM

## 2017-05-12 DIAGNOSIS — Z7982 Long term (current) use of aspirin: Secondary | ICD-10-CM

## 2017-05-12 DIAGNOSIS — Z9981 Dependence on supplemental oxygen: Secondary | ICD-10-CM

## 2017-05-12 DIAGNOSIS — Z96653 Presence of artificial knee joint, bilateral: Secondary | ICD-10-CM | POA: Diagnosis present

## 2017-05-12 DIAGNOSIS — Z974 Presence of external hearing-aid: Secondary | ICD-10-CM

## 2017-05-12 DIAGNOSIS — I119 Hypertensive heart disease without heart failure: Secondary | ICD-10-CM | POA: Diagnosis present

## 2017-05-12 DIAGNOSIS — F419 Anxiety disorder, unspecified: Secondary | ICD-10-CM | POA: Diagnosis present

## 2017-05-12 DIAGNOSIS — E785 Hyperlipidemia, unspecified: Secondary | ICD-10-CM | POA: Diagnosis present

## 2017-05-12 DIAGNOSIS — K922 Gastrointestinal hemorrhage, unspecified: Secondary | ICD-10-CM | POA: Diagnosis not present

## 2017-05-12 DIAGNOSIS — K589 Irritable bowel syndrome without diarrhea: Secondary | ICD-10-CM | POA: Diagnosis present

## 2017-05-12 DIAGNOSIS — Z90710 Acquired absence of both cervix and uterus: Secondary | ICD-10-CM

## 2017-05-12 DIAGNOSIS — Z973 Presence of spectacles and contact lenses: Secondary | ICD-10-CM

## 2017-05-12 DIAGNOSIS — I2510 Atherosclerotic heart disease of native coronary artery without angina pectoris: Secondary | ICD-10-CM | POA: Diagnosis present

## 2017-05-12 DIAGNOSIS — Z79899 Other long term (current) drug therapy: Secondary | ICD-10-CM

## 2017-05-12 DIAGNOSIS — N3281 Overactive bladder: Secondary | ICD-10-CM | POA: Diagnosis present

## 2017-05-12 DIAGNOSIS — Z8249 Family history of ischemic heart disease and other diseases of the circulatory system: Secondary | ICD-10-CM

## 2017-05-12 DIAGNOSIS — D62 Acute posthemorrhagic anemia: Secondary | ICD-10-CM | POA: Diagnosis present

## 2017-05-12 DIAGNOSIS — Z881 Allergy status to other antibiotic agents status: Secondary | ICD-10-CM

## 2017-05-12 DIAGNOSIS — M810 Age-related osteoporosis without current pathological fracture: Secondary | ICD-10-CM | POA: Diagnosis present

## 2017-05-12 DIAGNOSIS — Z888 Allergy status to other drugs, medicaments and biological substances status: Secondary | ICD-10-CM

## 2017-05-12 DIAGNOSIS — F209 Schizophrenia, unspecified: Secondary | ICD-10-CM | POA: Diagnosis present

## 2017-05-12 DIAGNOSIS — F418 Other specified anxiety disorders: Secondary | ICD-10-CM | POA: Diagnosis present

## 2017-05-12 DIAGNOSIS — K219 Gastro-esophageal reflux disease without esophagitis: Secondary | ICD-10-CM | POA: Diagnosis present

## 2017-05-12 DIAGNOSIS — Z89422 Acquired absence of other left toe(s): Secondary | ICD-10-CM

## 2017-05-12 DIAGNOSIS — Z9049 Acquired absence of other specified parts of digestive tract: Secondary | ICD-10-CM

## 2017-05-12 DIAGNOSIS — M069 Rheumatoid arthritis, unspecified: Secondary | ICD-10-CM | POA: Diagnosis present

## 2017-05-12 LAB — COMPREHENSIVE METABOLIC PANEL
ALBUMIN: 3 g/dL — AB (ref 3.5–5.0)
ALK PHOS: 52 U/L (ref 38–126)
ALT: 10 U/L — AB (ref 14–54)
ANION GAP: 9 (ref 5–15)
AST: 14 U/L — ABNORMAL LOW (ref 15–41)
BILIRUBIN TOTAL: 0.4 mg/dL (ref 0.3–1.2)
BUN: 19 mg/dL (ref 6–20)
CALCIUM: 9.2 mg/dL (ref 8.9–10.3)
CO2: 28 mmol/L (ref 22–32)
CREATININE: 0.64 mg/dL (ref 0.44–1.00)
Chloride: 105 mmol/L (ref 101–111)
GFR calc Af Amer: >60 mL/min (ref >60)
GFR calc non Af Amer: >60 mL/min (ref >60)
GLUCOSE: 98 mg/dL (ref 65–99)
Potassium: 4 mmol/L (ref 3.5–5.1)
Sodium: 142 mmol/L (ref 135–145)
TOTAL PROTEIN: 6.3 g/dL — AB (ref 6.5–8.1)

## 2017-05-12 LAB — BASIC METABOLIC PANEL
BUN: 16 (ref 4–21)
Creatinine: 0.5 (ref 0.5–1.1)
Glucose: 82
Potassium: 4.5 (ref 3.4–5.3)
Sodium: 138 (ref 137–147)

## 2017-05-12 LAB — CBC AND DIFFERENTIAL
HCT: 37 (ref 36–46)
HCT: 37 (ref 36–46)
HEMOGLOBIN: 12.1 (ref 12.0–16.0)
Hemoglobin: 12.1 (ref 12.0–16.0)
NEUTROS ABS: 5
Neutrophils Absolute: 5
PLATELETS: 233 (ref 150–399)
Platelets: 233 (ref 150–399)
WBC: 6.9
WBC: 6.9

## 2017-05-12 LAB — CBC
HCT: 36.5 % (ref 36.0–46.0)
HEMOGLOBIN: 11.4 g/dL — AB (ref 12.0–15.0)
MCH: 29.8 pg (ref 26.0–34.0)
MCHC: 31.2 g/dL (ref 30.0–36.0)
MCV: 95.3 fL (ref 78.0–100.0)
PLATELETS: 215 10*3/uL (ref 150–400)
RBC: 3.83 MIL/uL — ABNORMAL LOW (ref 3.87–5.11)
RDW: 13.8 % (ref 11.5–15.5)
WBC: 6.9 10*3/uL (ref 4.0–10.5)

## 2017-05-12 LAB — HEMOGLOBIN AND HEMATOCRIT, BLOOD
HEMATOCRIT: 34.2 % — AB (ref 36.0–46.0)
HEMOGLOBIN: 10.8 g/dL — AB (ref 12.0–15.0)

## 2017-05-12 LAB — TYPE AND SCREEN
ABO/RH(D): A POS
ANTIBODY SCREEN: NEGATIVE

## 2017-05-12 LAB — ABO/RH: ABO/RH(D): A POS

## 2017-05-12 IMAGING — CR DG THORACIC SPINE 2V
4 series · 4 of 4 positions shown · non-contrast
Comparison: Thoracic spine radiographs performed 04/28/2014

CLINICAL DATA: Recent falls, with upper back pain. Initial
encounter.

EXAM:
THORACIC SPINE 2 VIEWS

[t thoracic spine ap]
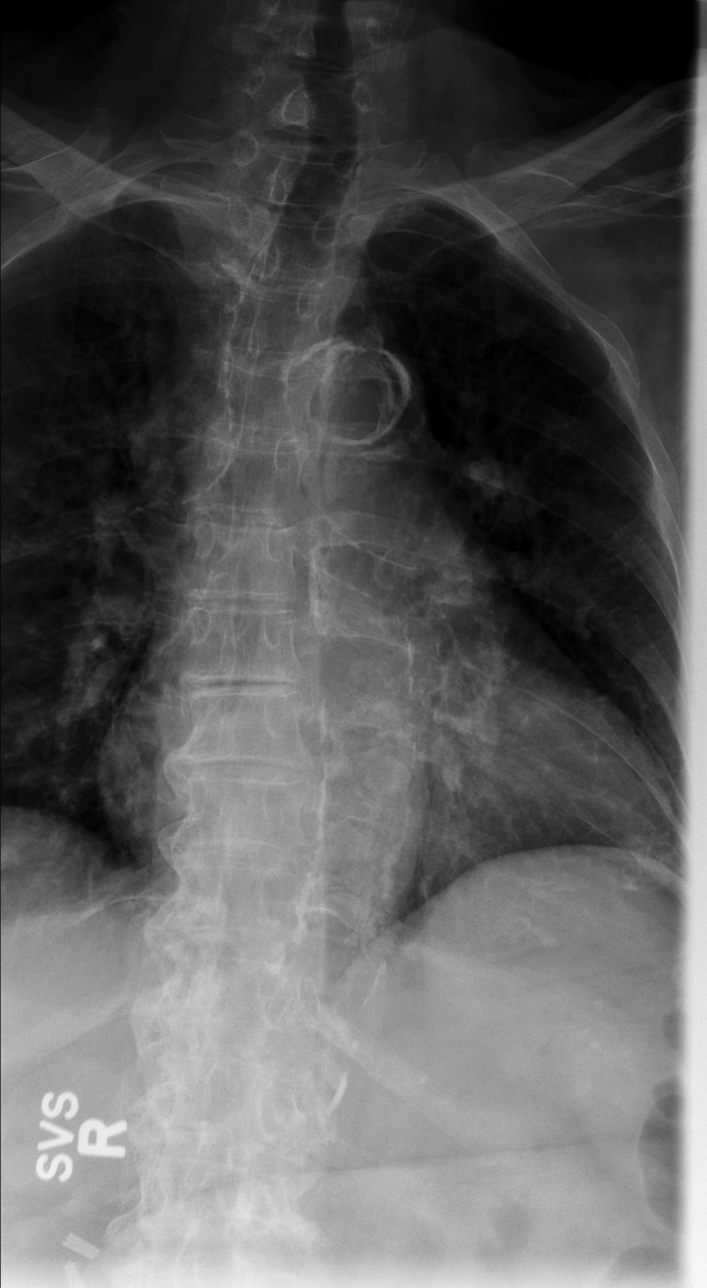

[t thoracic spine lat]
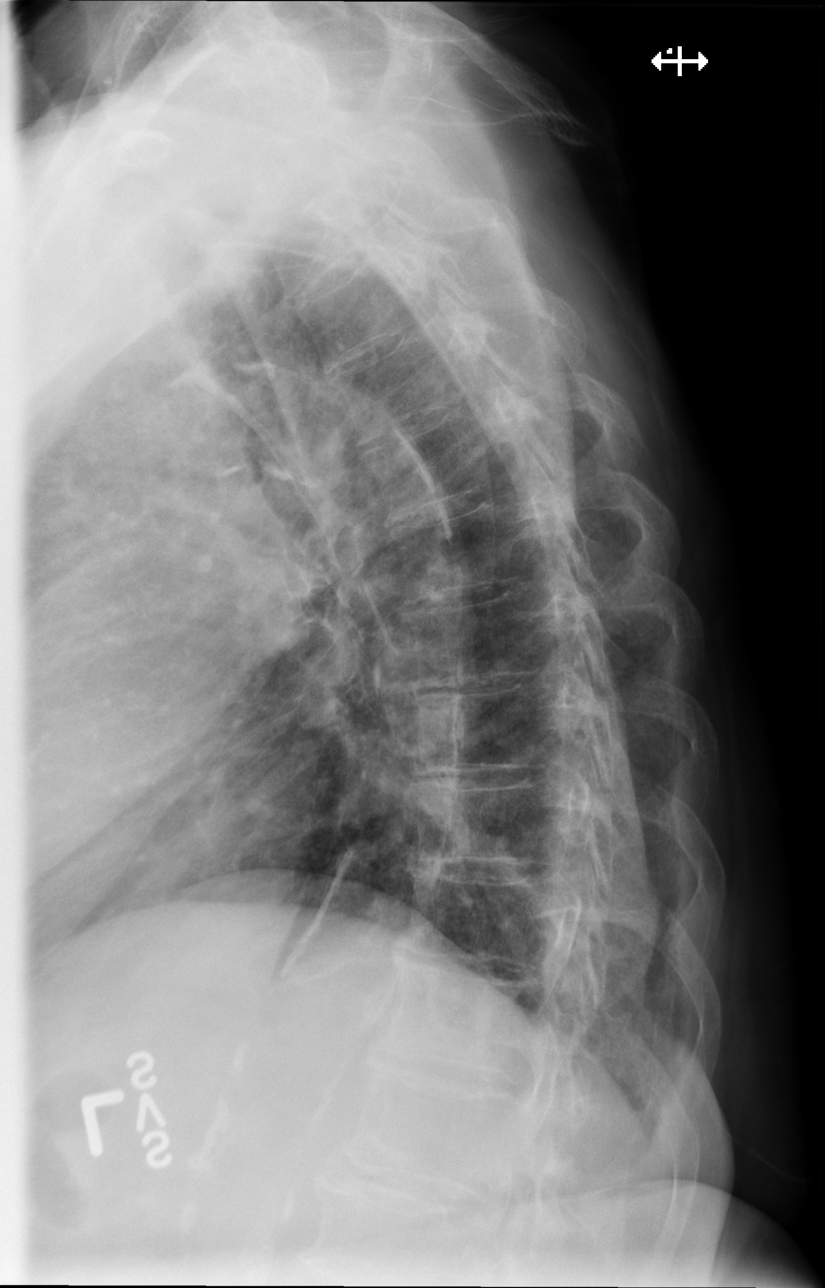

[t thoracic swimmers (1 of 2)]
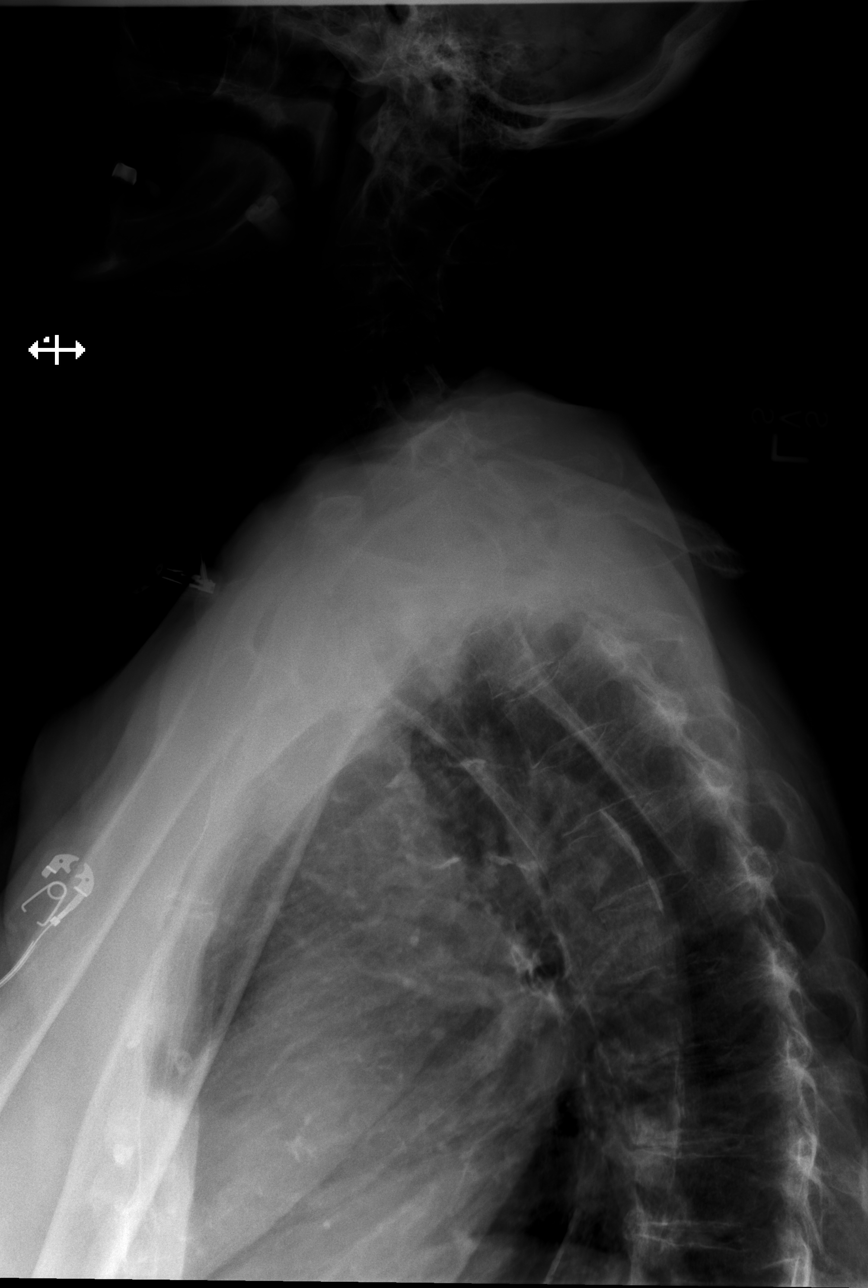

[t thoracic swimmers (2 of 2)]
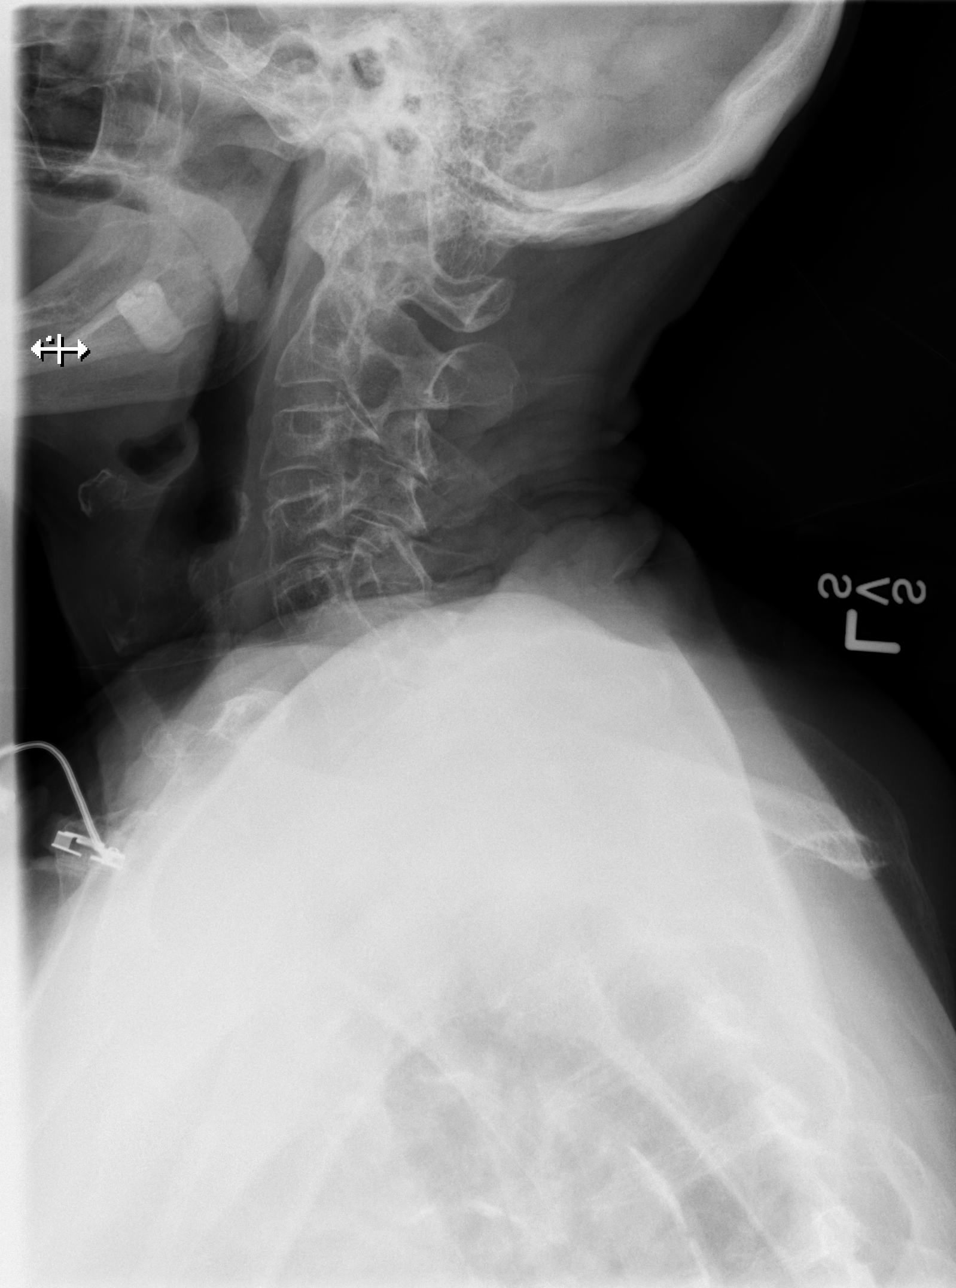

[4 of 4 positions shown; findings below may reference images not displayed]

FINDINGS: There is no evidence of fracture or subluxation. Vertebral bodies
demonstrate normal height and alignment. Intervertebral disc spaces
are preserved.

The visualized portions of both lungs are clear. The mediastinum is
unremarkable in appearance. Scattered vascular calcifications are
seen.
IMPRESSION: 1. No evidence of fracture or subluxation along the thoracic spine.
2. Scattered vascular calcifications seen.

## 2017-05-12 MED ORDER — ONDANSETRON HCL 4 MG/2ML IJ SOLN: 4.0000 mg | Freq: Four times a day (QID) | INTRAMUSCULAR | Status: DC | PRN

## 2017-05-12 MED ORDER — IOPAMIDOL (ISOVUE-300) INJECTION 61%
100.0000 mL | Freq: Once | INTRAVENOUS | Status: AC | PRN
  Administered 2017-05-12: 100 mL via INTRAVENOUS

## 2017-05-12 MED ORDER — RISAQUAD PO CAPS
1.0000 | ORAL_CAPSULE | Freq: Every day | ORAL | Status: DC
  Administered 2017-05-14 – 2017-05-17 (×4): 1 via ORAL
  Filled 2017-05-12 (×4): qty 1

## 2017-05-12 MED ORDER — ONDANSETRON HCL 4 MG PO TABS: 4.0000 mg | ORAL_TABLET | Freq: Four times a day (QID) | ORAL | Status: DC | PRN

## 2017-05-12 MED ORDER — SERTRALINE HCL 100 MG PO TABS
100.0000 mg | ORAL_TABLET | Freq: Every day | ORAL | Status: DC
  Administered 2017-05-14 – 2017-05-17 (×4): 100 mg via ORAL
  Filled 2017-05-12 (×4): qty 1

## 2017-05-12 MED ORDER — RISPERIDONE 0.5 MG PO TABS
0.5000 mg | ORAL_TABLET | Freq: Two times a day (BID) | ORAL | Status: DC
  Administered 2017-05-12 – 2017-05-17 (×9): 0.5 mg via ORAL
  Filled 2017-05-12 (×9): qty 1

## 2017-05-12 MED ORDER — MIRABEGRON ER 25 MG PO TB24
50.0000 mg | ORAL_TABLET | Freq: Every day | ORAL | Status: DC
  Administered 2017-05-14 – 2017-05-17 (×4): 50 mg via ORAL
  Filled 2017-05-12 (×5): qty 2

## 2017-05-12 MED ORDER — TRAMADOL HCL 50 MG PO TABS
50.0000 mg | ORAL_TABLET | Freq: Four times a day (QID) | ORAL | Status: DC | PRN
  Administered 2017-05-13 – 2017-05-15 (×3): 50 mg via ORAL
  Filled 2017-05-12 (×3): qty 1

## 2017-05-12 MED ORDER — IOPAMIDOL (ISOVUE-300) INJECTION 61%
INTRAVENOUS | Status: AC
  Filled 2017-05-12: qty 100

## 2017-05-12 MED ORDER — SODIUM CHLORIDE 0.9 % IV BOLUS
500.0000 mL | Freq: Once | INTRAVENOUS | Status: AC
  Administered 2017-05-12: 500 mL via INTRAVENOUS

## 2017-05-12 NOTE — ED Notes (Signed)
Patient had large amount of blood in brief.

## 2017-05-12 NOTE — Telephone Encounter (Signed)
Dr. Carlean Purl this is a nursing home patient that they are asking [place] to see today.  The contact from [facility]- NP is [phone].  You are MD of the day

## 2017-05-12 NOTE — ED Notes (Signed)
Patient transported to CT 

## 2017-05-12 NOTE — ED Notes (Signed)
ED TO INPATIENT HANDOFF REPORT  Name/Age/Gender Tracy Sharp 84 y.o. female  Code Status Code Status History    Date Active Date Inactive Code Status Order ID Comments User Context   07/24/2016 1927 07/28/2016 1642 Full Code [id number]  Vianne Bulls, MD ED   07/21/2015 1023 07/22/2015 2221 Full Code [id number]  Waldemar Dickens, MD ED   09/24/2014 1409 09/25/2014 1909 DNR [id number]  Samella Parr, NP Inpatient   09/16/2014 2244 09/17/2014 2225 DNR [id number]  Lavina Hamman, MD ED      Home/SNF/Other Nursing facility  Chief Complaint Abdominal Pain; Blood in Stool  Level of Care/Admitting Diagnosis ED Disposition    ED Disposition Condition [place]: [facility] [100102]  Level of Care: Telemetry [5]  Admit to tele based on following criteria: Other see comments  Comments: Gi bleed  Diagnosis: Lower GI bleed [174944]  Admitting Physician: [name] [[id number]]  Attending Physician: [name] Nessa.Cuff  PT Class (Do Not Modify): Observation [104]  PT Acc Code (Do Not Modify): Observation [10022]       Medical History Past Medical History:  Diagnosis Date  . Anxiety   . Back pain   . Bilateral shoulder pain   . Bladder spasms   . CAD (coronary artery disease)    cardiac cath '07 - no obstructive disease  . Depression   . Fall at home   . Family history of adverse reaction to anesthesia    son had trouble after intestinal surgery   . Full dentures   . Gait disturbance   . Gastritis   . Hyperlipidemia   . Hypertension   . IBS (irritable bowel syndrome)   . Mixed incontinence urge and stress (female)(female)    irritibile bladder  . Neuropathy, peripheral   . Osteoarthritis   . Osteoporosis   . Overweight(278.02)   . Phlebitis    one leg  . Rheumatoid arthritis ([place])   . Umbilical hernia   . UTI (urinary tract infection) 07/21/2015  . Wears glasses   . Wears hearing aid    both ears     Allergies Allergies  Allergen Reactions  . Doxycycline Other (See Comments)    intolerance  . Hydrocodone Other (See Comments)    intolerance  . Norco [Hydrocodone-Acetaminophen] Other (See Comments)    Nausea, dizziness  . Toviaz [Fesoterodine Fumarate Er] Other (See Comments)    dizzy  . Plaquenil [Hydroxychloroquine] Rash    IV Location/Drains/Wounds Patient Lines/Drains/Airways Status   Active Line/Drains/Airways    Name:   Placement date:   Placement time:   Site:   Days:   Peripheral IV 05/12/17 Right Antecubital   05/12/17    1608    Antecubital   less than 1   Incision (Closed) 01/03/14 Foot Left   01/03/14    1135     1225   Wound / Incision (Open or Dehisced) 08/01/15 Laceration Head Posterior 1 Sharp laceration bleeding controlled   08/01/15    -    Head   650          Labs/Imaging Results for orders placed or performed during the hospital encounter of 05/12/17 (from the past 48 hour(s))  Comprehensive metabolic panel     Status: Abnormal   Collection Time: 05/12/17  4:11 PM  Result Value Ref Range   Sodium 142 135 - 145 mmol/L   Potassium 4.0 3.5 - 5.1 mmol/L   Chloride  105 101 - 111 mmol/L   CO2 28 22 - 32 mmol/L   Glucose, Bld 98 65 - 99 mg/dL   BUN 19 6 - 20 mg/dL   Creatinine, Ser 0.64 0.44 - 1.00 mg/dL   Calcium 9.2 8.9 - 10.3 mg/dL   Total Protein 6.3 (L) 6.5 - 8.1 g/dL   Albumin 3.0 (L) 3.5 - 5.0 g/dL   AST 14 (L) 15 - 41 U/L   ALT 10 (L) 14 - 54 U/L   Alkaline Phosphatase 52 38 - 126 U/L   Total Bilirubin 0.4 0.3 - 1.2 mg/dL   GFR calc non Af Amer >60 >60 mL/min   GFR calc Af Amer >60 >60 mL/min    Comment: (NOTE) The eGFR has been calculated using the CKD EPI equation. This calculation has not been validated in all clinical situations. eGFR's persistently <60 mL/min signify possible Chronic Kidney Disease.    Anion gap 9 5 - 15    Comment: Performed at [facility], [place] [address], [place], [place] [address]  CBC      Status: Abnormal   Collection Time: 05/12/17  4:11 PM  Result Value Ref Range   WBC 6.9 4.0 - 10.5 K/uL   RBC 3.83 (L) 3.87 - 5.11 MIL/uL   Hemoglobin 11.4 (L) 12.0 - 15.0 g/dL   HCT 36.5 36.0 - 46.0 %   MCV 95.3 78.0 - 100.0 fL   MCH 29.8 26.0 - 34.0 pg   MCHC 31.2 30.0 - 36.0 g/dL   RDW 13.8 11.5 - 15.5 %   Platelets 215 150 - 400 K/uL    Comment: Performed at [facility], [place] [address] [place], [place] [address]  Type and screen [place]     Status: None   Collection Time: 05/12/17  4:11 PM  Result Value Ref Range   ABO/RH(D) A POS    Antibody Screen NEG    Sample Expiration      05/15/2017 Performed at [facility], [place] [address], [place], [place] [address]    Ct Abdomen Pelvis W Contrast  Result Date: 05/12/2017 CLINICAL DATA:  Rectal bleeding x2 today. EXAM: CT ABDOMEN AND PELVIS WITH CONTRAST TECHNIQUE: Multidetector CT imaging of the abdomen and pelvis was performed using the standard protocol following bolus administration of intravenous contrast. CONTRAST:  17m ISOVUE-300 IOPAMIDOL (ISOVUE-300) INJECTION 61% COMPARISON:  07/21/2015, 09/04/2010 CT FINDINGS: Lower chest: Cardiomegaly with coronary arteriosclerosis. No pericardial effusion. Small hiatal hernia. Subpleural atelectasis and/or scarring.8 mm noncalcified nodule in the right lower lobe, dating back to 2000 12 compatible with a benign finding. Bronchiectasis noted in both lower lobes. Hepatobiliary: Status post cholecystectomy. Reservoir effect from prior cholecystectomy accounting for mild intrahepatic and extrahepatic ductal dilatation without stones. Stable 10 and 7 mm hypodensities in the left hepatic lobe are unchanged in appearance from 2017 comparison. These are consistent with benign findings. Pancreas: No focal mass, ductal dilatation or inflammation. Spleen: Normal size spleen without mass. Adrenals/Urinary Tract: No adrenal mass. Stable appearance  of the right kidney without acute abnormality. Exophytic hyperdense 12 mm left renal mass is unchanged off the interpolar aspect dating back to 2012 comparison. Nonobstructing 3 mm left lower pole renal calculus. No hydroureteronephrosis. The urinary bladder is unremarkable for degree of distention. Stomach/Bowel: Small hiatal hernia. Decompressed stomach with normal small bowel rotation and no obstruction. Status post appendectomy by report. No acute large bowel obstruction. Circular muscle hypertrophy and extensive sigmoid diverticulosis is noted without  acute diverticulitis. Vascular/Lymphatic: Moderate aortoiliac atherosclerosis without aneurysm or adenopathy. Reproductive: Status post hysterectomy. No adnexal masses. Other: No free air nor free fluid. Musculoskeletal: No worrisome lytic or sclerotic osseous lesions. Inferior endplate compression of L1 is chronic. Diffuse degenerative disc disease of the lumbar spine. IMPRESSION: 1. Circular muscle hypertrophy and extensive sigmoid diverticulosis without acute diverticulitis. The diverticulosis may be contributing to the patient's rectal bleeding. 2. Stable interpolar left renal enhancing lesion though stable since 2012 suggesting a benign finding. 3. Nonobstructing left lower pole renal calculus measuring approximately 3 mm. 4. Stable subcentimeter left hepatic hypodensities statistically consistent with cysts or hemangiomata. 5. Aortoiliac atherosclerosis without aneurysm. Electronically Signed   By: Ashley Royalty M.D.   On: 05/12/2017 18:53    Pending Labs Unresulted Labs (From admission, onward)   Start     Ordered   05/13/17 0600  CBC  Now then every 8 hours,   R     05/12/17 2053   05/12/17 2200  Hemoglobin and hematocrit, blood  Once,   R     05/12/17 2053   05/12/17 1610  ABO/Rh  Once,   R     05/12/17 1610      Vitals/Pain Today's Vitals   05/12/17 1920 05/12/17 1930 05/12/17 2000 05/12/17 2030  BP: 133/65 (!) 119/56 115/74 123/61   Pulse: 66 66 65 62  Resp: _0 Temp:      TempSrc:      SpO2: 100% 100% 100% 100%  PainSc:        Isolation Precautions No active isolations  Medications Medications  sodium chloride 0.9 % bolus 500 mL (0 mLs Intravenous Stopped 05/12/17 1817)  iopamidol (ISOVUE-300) 61 % injection 100 mL (100 mLs Intravenous Contrast Given 05/12/17 1819)    Mobility non-ambulatory

## 2017-05-12 NOTE — Telephone Encounter (Signed)
2 stools with blood mixed in "moderate amount"  VSS not tachycardic or hypotensive  Nothing visible externally on rectum  CBC ordered stat  I recommended rectal exam to look for impaction - if brown stool likely hemorrhoid bleeding If bloody output then possibly diverticular bleeding - or other and if worsens and gets unstable would need to go to ED  Advised call me back prn but no office visit until above completed and if persistent/worsening then to ED
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
## 2017-05-12 NOTE — ED Triage Notes (Signed)
Per [facility] pt coming from [facility] home with c/o rectal bleeding x 2 today. History of diverticulitis. C/o lower abdominal pain.
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
## 2017-05-12 NOTE — ED Notes (Signed)
Bed: LF81 Expected date:  Expected time:  Means of arrival:  Comments: 84 yo GI bleed

## 2017-05-12 NOTE — Telephone Encounter (Signed)
Left message w/ NP tocall me back on my cell

## 2017-05-12 NOTE — ED Notes (Signed)
Patient had another large amount of rectal bleeding. Bedside cleaning done.

## 2017-05-13 ENCOUNTER — Inpatient Hospital Stay (HOSPITAL_COMMUNITY): Payer: Medicare Other

## 2017-05-13 ENCOUNTER — Other Ambulatory Visit: Payer: Self-pay

## 2017-05-13 DIAGNOSIS — F201 Disorganized schizophrenia: Secondary | ICD-10-CM | POA: Diagnosis not present

## 2017-05-13 DIAGNOSIS — M05711 Rheumatoid arthritis with rheumatoid factor of right shoulder without organ or systems involvement: Secondary | ICD-10-CM

## 2017-05-13 DIAGNOSIS — I119 Hypertensive heart disease without heart failure: Secondary | ICD-10-CM | POA: Diagnosis present

## 2017-05-13 DIAGNOSIS — R710 Precipitous drop in hematocrit: Secondary | ICD-10-CM | POA: Diagnosis not present

## 2017-05-13 DIAGNOSIS — E785 Hyperlipidemia, unspecified: Secondary | ICD-10-CM | POA: Diagnosis present

## 2017-05-13 DIAGNOSIS — F419 Anxiety disorder, unspecified: Secondary | ICD-10-CM | POA: Diagnosis present

## 2017-05-13 DIAGNOSIS — H9190 Unspecified hearing loss, unspecified ear: Secondary | ICD-10-CM | POA: Diagnosis present

## 2017-05-13 DIAGNOSIS — Z89422 Acquired absence of other left toe(s): Secondary | ICD-10-CM | POA: Diagnosis not present

## 2017-05-13 DIAGNOSIS — K5791 Diverticulosis of intestine, part unspecified, without perforation or abscess with bleeding: Secondary | ICD-10-CM | POA: Diagnosis not present

## 2017-05-13 DIAGNOSIS — I808 Phlebitis and thrombophlebitis of other sites: Secondary | ICD-10-CM | POA: Diagnosis not present

## 2017-05-13 DIAGNOSIS — Z973 Presence of spectacles and contact lenses: Secondary | ICD-10-CM | POA: Diagnosis not present

## 2017-05-13 DIAGNOSIS — M05712 Rheumatoid arthritis with rheumatoid factor of left shoulder without organ or systems involvement: Secondary | ICD-10-CM | POA: Diagnosis not present

## 2017-05-13 DIAGNOSIS — K219 Gastro-esophageal reflux disease without esophagitis: Secondary | ICD-10-CM | POA: Diagnosis present

## 2017-05-13 DIAGNOSIS — K922 Gastrointestinal hemorrhage, unspecified: Secondary | ICD-10-CM | POA: Diagnosis present

## 2017-05-13 DIAGNOSIS — K625 Hemorrhage of anus and rectum: Secondary | ICD-10-CM | POA: Diagnosis not present

## 2017-05-13 DIAGNOSIS — G629 Polyneuropathy, unspecified: Secondary | ICD-10-CM | POA: Diagnosis present

## 2017-05-13 DIAGNOSIS — D649 Anemia, unspecified: Secondary | ICD-10-CM | POA: Diagnosis not present

## 2017-05-13 DIAGNOSIS — Z7982 Long term (current) use of aspirin: Secondary | ICD-10-CM | POA: Diagnosis not present

## 2017-05-13 DIAGNOSIS — N3281 Overactive bladder: Secondary | ICD-10-CM | POA: Diagnosis present

## 2017-05-13 DIAGNOSIS — Z974 Presence of external hearing-aid: Secondary | ICD-10-CM | POA: Diagnosis not present

## 2017-05-13 DIAGNOSIS — M069 Rheumatoid arthritis, unspecified: Secondary | ICD-10-CM | POA: Diagnosis present

## 2017-05-13 DIAGNOSIS — I350 Nonrheumatic aortic (valve) stenosis: Secondary | ICD-10-CM | POA: Diagnosis present

## 2017-05-13 DIAGNOSIS — M810 Age-related osteoporosis without current pathological fracture: Secondary | ICD-10-CM | POA: Diagnosis present

## 2017-05-13 DIAGNOSIS — F418 Other specified anxiety disorders: Secondary | ICD-10-CM | POA: Diagnosis present

## 2017-05-13 DIAGNOSIS — K5733 Diverticulitis of large intestine without perforation or abscess with bleeding: Secondary | ICD-10-CM | POA: Diagnosis present

## 2017-05-13 DIAGNOSIS — I10 Essential (primary) hypertension: Secondary | ICD-10-CM | POA: Diagnosis not present

## 2017-05-13 DIAGNOSIS — N3946 Mixed incontinence: Secondary | ICD-10-CM | POA: Diagnosis present

## 2017-05-13 DIAGNOSIS — I2510 Atherosclerotic heart disease of native coronary artery without angina pectoris: Secondary | ICD-10-CM | POA: Diagnosis present

## 2017-05-13 DIAGNOSIS — F209 Schizophrenia, unspecified: Secondary | ICD-10-CM | POA: Diagnosis present

## 2017-05-13 DIAGNOSIS — Z972 Presence of dental prosthetic device (complete) (partial): Secondary | ICD-10-CM | POA: Diagnosis not present

## 2017-05-13 DIAGNOSIS — Z90710 Acquired absence of both cervix and uterus: Secondary | ICD-10-CM | POA: Diagnosis not present

## 2017-05-13 DIAGNOSIS — D62 Acute posthemorrhagic anemia: Secondary | ICD-10-CM | POA: Diagnosis present

## 2017-05-13 DIAGNOSIS — K589 Irritable bowel syndrome without diarrhea: Secondary | ICD-10-CM | POA: Diagnosis present

## 2017-05-13 HISTORY — PX: IR ANGIOGRAM VISCERAL SELECTIVE: IMG657

## 2017-05-13 HISTORY — PX: IR US GUIDE VASC ACCESS RIGHT: IMG2390

## 2017-05-13 LAB — CBC
HCT: 31.5 % — ABNORMAL LOW (ref 36.0–46.0)
HEMATOCRIT: 33.5 % — AB (ref 36.0–46.0)
HEMATOCRIT: 30.4 % — AB (ref 36.0–46.0)
HEMOGLOBIN: 10.7 g/dL — AB (ref 12.0–15.0)
HEMOGLOBIN: 9.8 g/dL — AB (ref 12.0–15.0)
Hemoglobin: 10 g/dL — ABNORMAL LOW (ref 12.0–15.0)
MCH: 30.3 pg (ref 26.0–34.0)
MCH: 30.1 pg (ref 26.0–34.0)
MCH: 30.5 pg (ref 26.0–34.0)
MCHC: 31.7 g/dL (ref 30.0–36.0)
MCHC: 31.9 g/dL (ref 30.0–36.0)
MCHC: 32.2 g/dL (ref 30.0–36.0)
MCV: 95.5 fL (ref 78.0–100.0)
MCV: 94.1 fL (ref 78.0–100.0)
MCV: 94.7 fL (ref 78.0–100.0)
Platelets: 197 10*3/uL (ref 150–400)
Platelets: 183 10*3/uL (ref 150–400)
Platelets: 192 10*3/uL (ref 150–400)
RBC: 3.3 MIL/uL — ABNORMAL LOW (ref 3.87–5.11)
RBC: 3.56 MIL/uL — AB (ref 3.87–5.11)
RBC: 3.21 MIL/uL — AB (ref 3.87–5.11)
RDW: 14 % (ref 11.5–15.5)
RDW: 13.6 % (ref 11.5–15.5)
RDW: 13.7 % (ref 11.5–15.5)
WBC: 5.5 10*3/uL (ref 4.0–10.5)
WBC: 7.4 10*3/uL (ref 4.0–10.5)
WBC: 6.9 10*3/uL (ref 4.0–10.5)

## 2017-05-13 LAB — PROTIME-INR
INR: 1.06
PROTHROMBIN TIME: 13.7 s (ref 11.4–15.2)

## 2017-05-13 LAB — MRSA PCR SCREENING: MRSA BY PCR: NEGATIVE

## 2017-05-13 MED ORDER — SODIUM CHLORIDE 0.9 % IV BOLUS
250.0000 mL | Freq: Once | INTRAVENOUS | Status: AC
  Administered 2017-05-13: 250 mL via INTRAVENOUS

## 2017-05-13 MED ORDER — MIDAZOLAM HCL 2 MG/2ML IJ SOLN
INTRAMUSCULAR | Status: AC | PRN
  Administered 2017-05-13: 0.5 mg via INTRAVENOUS

## 2017-05-13 MED ORDER — IOPAMIDOL (ISOVUE-300) INJECTION 61%
INTRAVENOUS | Status: AC
  Administered 2017-05-14: 121 mL
  Filled 2017-05-13: qty 150

## 2017-05-13 MED ORDER — TECHNETIUM TC 99M-LABELED RED BLOOD CELLS IV KIT
28.6000 | PACK | Freq: Once | INTRAVENOUS | Status: AC | PRN
  Administered 2017-05-13: 28.6 via INTRAVENOUS

## 2017-05-13 MED ORDER — MIDAZOLAM HCL 2 MG/2ML IJ SOLN
INTRAMUSCULAR | Status: AC
  Filled 2017-05-13: qty 2

## 2017-05-13 MED ORDER — LIDOCAINE HCL 1 % IJ SOLN
INTRAMUSCULAR | Status: AC
  Filled 2017-05-13: qty 20

## 2017-05-13 MED ORDER — LIDOCAINE HCL 1 % IJ SOLN
INTRAMUSCULAR | Status: AC | PRN
  Administered 2017-05-13: 5 mL via INTRADERMAL

## 2017-05-13 MED ORDER — FENTANYL CITRATE (PF) 100 MCG/2ML IJ SOLN
INTRAMUSCULAR | Status: AC
  Filled 2017-05-13: qty 2

## 2017-05-13 MED ORDER — FENTANYL CITRATE (PF) 100 MCG/2ML IJ SOLN
INTRAMUSCULAR | Status: AC | PRN
  Administered 2017-05-13: 25 ug via INTRAVENOUS

## 2017-05-13 NOTE — Progress Notes (Addendum)
@IPLOG @        PROGRESS NOTE                                                                                                                                                                                                             Patient Demographics:    Tracy Sharp, is a 84 y.o. female, DOB - 05-Nov-1924, PNT:[id number]  Admit date - 05/12/2017   Admitting Physician Ejiroghene Arlyce Dice, MD  Outpatient Primary MD for the patient is [name], DO  LOS - 0  Chief Complaint  Patient presents with  . Rectal Bleeding       Brief Narrative   Tracy Sharp is a 84 y.o. female with medical history significant Schizophrenia, RA, Depression, HTN, HLD, who was brought to ED with c/o rectal bleed that started today. Bright red blood with clots, twice today at the nursing home.  Also with suprapubic pain intermittent over the past several weeks per patient, but worse over the past 2-3 days. she reports chronic intermittent dizziness/vertigo, but none today.  No chest pain.  No fever or chills.  He denies dysuria.  Patient is on chronic 2 L O2 at the nursing home.  Patient is on daily baby aspirin.  ED Course: Stable vitals in the ED systolic blood pressure 086P to 160, regular pulse 60s.  In ED patient had 2 more bloody bowel movements mostly clots.  Hemoglobin stable at 11.4.  Platelets 215.  Unremarkable lytes and cr.  CT abdomen and pelvis W contrast  revealed extensive diverticulosis, acute diverticulitis. GI was called in the ED by EDP.  Pathologist was called to admit for Lower GI bleed.     Subjective:    Eliott Nine today has, No headache, No chest pain, No abdominal pain - No Nausea, No new weakness tingling or numbness, No Cough - SOB. 1 bloody BM this am.   Assessment  & Plan :    Lower GI bleed- blood per rectum, some abdominal cramping initially, CT consistent with diverticulosis, most likely diverticular lower GI bleed, H&H for now stable which we will continue to monitor, type  screen done, clear liquid diet, GI on board.  If bleeding is profuse first line of treatment will be IR consult for embolization per GI.   Addendum.  Bleeding reoccurred around 3 PM.  Tagged scan ordered, case discussed with IR physician Dr. and see the patient shortly embolization evaluation.     Schizophrenia-appears stable.  Per daughter-in-law no significant memory issues. - cont home risperidone  Rheumatoid arthritis-stable.  Diffuse small joint deformities.  - resume home methotrexate on d/c    Diet : Diet clear liquid Room service appropriate? Yes; Fluid consistency: Thin    Family Communication  :  None  Code Status :  Partial  Disposition Plan  : Home once beeding has resolved  Consults  :  GI  Procedures  :    CT - IMPRESSION: 1. Circular muscle hypertrophy and extensive sigmoid diverticulosis without acute diverticulitis. The diverticulosis may be contributing to the patient's rectal bleeding. 2. Stable interpolar left renal enhancing lesion though stable since 2012 suggesting a benign finding. 3. Nonobstructing left lower pole renal calculus measuring approximately 3 mm. 4. Stable subcentimeter left hepatic hypodensities statistically consistent with cysts or hemangiomata. 5. Aortoiliac atherosclerosis without aneurysm.  DVT Prophylaxis  :   SCDs   Lab Results  Component Value Date   PLT 197 05/13/2017    Inpatient Medications  Scheduled Meds: . acidophilus  1 capsule Oral Daily  . mirabegron ER  50 mg Oral Daily  . risperiDONE  0.5 mg Oral BID  . sertraline  100 mg Oral Daily   Continuous Infusions: PRN Meds:.ondansetron **OR** ondansetron (ZOFRAN) IV, traMADol  Antibiotics  :    Anti-infectives (From admission, onward)   None         Objective:   Vitals:   05/12/17 2130 05/12/17 2212 05/12/17 2257 05/13/17 0537  BP: 110/62  138/69 119/60  Pulse: 62  61 68  Resp: 17  18 18   Temp:   97.7 F (36.5 C) 97.7 F (36.5 C)  TempSrc:   Oral Oral   SpO2: 99%  100% 99%  Weight:  75.8 kg (167 lb 1.7 oz)    Height:  5\' 2"  (1.575 m)      Wt Readings from Last 3 Encounters:  05/12/17 75.8 kg (167 lb 1.7 oz)  05/08/17 74.5 kg (164 lb 3.2 oz)  03/30/17 69.4 kg (153 lb)     Intake/Output Summary (Last 24 hours) at 05/13/2017 1253 Last data filed at 05/12/2017 1817 Gross per 24 hour  Intake 500 ml  Output -  Net 500 ml     Physical Exam  Awake Alert,  No new F.N deficits, Normal affect .AT,PERRAL Supple Neck,No JVD, No cervical lymphadenopathy appriciated.  Symmetrical Chest wall movement, Good air movement bilaterally, CTAB RRR,No Gallops,Rubs or new Murmurs, No Parasternal Heave +ve B.Sounds, Abd Soft, No tenderness, No organomegaly appriciated, No rebound - guarding or rigidity. No Cyanosis, Clubbing or edema, No new Rash or bruise       Data Review:    CBC Recent Labs  Lab 05/12/17 1611 05/12/17 2212 05/13/17 0539  WBC 6.9  --  5.5  HGB 11.4* 10.8* 10.0*  HCT 36.5 34.2* 31.5*  PLT 215  --  197  MCV 95.3  --  95.5  MCH 29.8  --  30.3  MCHC 31.2  --  31.7  RDW 13.8  --  14.0    Chemistries  Recent Labs  Lab 05/12/17 1611  NA 142  K 4.0  CL 105  CO2 28  GLUCOSE 98  BUN 19  CREATININE 0.64  CALCIUM 9.2  AST 14*  ALT 10*  ALKPHOS 52  BILITOT 0.4   ------------------------------------------------------------------------------------------------------------------ No results for input(s): CHOL, HDL, LDLCALC, TRIG, CHOLHDL, LDLDIRECT in the last 72 hours.  No results found for: HGBA1C ------------------------------------------------------------------------------------------------------------------ No results for input(s): TSH, T4TOTAL, T3FREE, THYROIDAB in the last 72 hours.  Invalid input(s): FREET3 ------------------------------------------------------------------------------------------------------------------  No results for input(s): VITAMINB12, FOLATE, FERRITIN, TIBC, IRON, RETICCTPCT in the  last 72 hours.  Coagulation profile No results for input(s): INR, PROTIME in the last 168 hours.  No results for input(s): DDIMER in the last 72 hours.  Cardiac Enzymes No results for input(s): CKMB, TROPONINI, MYOGLOBIN in the last 168 hours.  Invalid input(s): CK ------------------------------------------------------------------------------------------------------------------    Component Value Date/Time   BNP 79.2 09/16/2014 2344    Micro Results Recent Results (from the past 240 hour(s))  MRSA PCR Screening     Status: None   Collection Time: 05/13/17  7:29 AM  Result Value Ref Range Status   MRSA by PCR NEGATIVE NEGATIVE Final    Comment:        The GeneXpert MRSA Assay (FDA approved for NASAL specimens only), is one component of a comprehensive MRSA colonization surveillance program. It is not intended to diagnose MRSA infection nor to guide or monitor treatment for MRSA infections. Performed at [facility], [place] [address], [place], [place] [address]     Radiology Reports Ct Abdomen Pelvis W Contrast  Result Date: 05/12/2017 CLINICAL DATA:  Rectal bleeding x2 today. EXAM: CT ABDOMEN AND PELVIS WITH CONTRAST TECHNIQUE: Multidetector CT imaging of the abdomen and pelvis was performed using the standard protocol following bolus administration of intravenous contrast. CONTRAST:  137mL ISOVUE-300 IOPAMIDOL (ISOVUE-300) INJECTION 61% COMPARISON:  07/21/2015, 09/04/2010 CT FINDINGS: Lower chest: Cardiomegaly with coronary arteriosclerosis. No pericardial effusion. Small hiatal hernia. Subpleural atelectasis and/or scarring.8 mm noncalcified nodule in the right lower lobe, dating back to 2000 12 compatible with a benign finding. Bronchiectasis noted in both lower lobes. Hepatobiliary: Status post cholecystectomy. Reservoir effect from prior cholecystectomy accounting for mild intrahepatic and extrahepatic ductal dilatation without stones. Stable 10 and 7 mm  hypodensities in the left hepatic lobe are unchanged in appearance from 2017 comparison. These are consistent with benign findings. Pancreas: No focal mass, ductal dilatation or inflammation. Spleen: Normal size spleen without mass. Adrenals/Urinary Tract: No adrenal mass. Stable appearance of the right kidney without acute abnormality. Exophytic hyperdense 12 mm left renal mass is unchanged off the interpolar aspect dating back to 2012 comparison. Nonobstructing 3 mm left lower pole renal calculus. No hydroureteronephrosis. The urinary bladder is unremarkable for degree of distention. Stomach/Bowel: Small hiatal hernia. Decompressed stomach with normal small bowel rotation and no obstruction. Status post appendectomy by report. No acute large bowel obstruction. Circular muscle hypertrophy and extensive sigmoid diverticulosis is noted without acute diverticulitis. Vascular/Lymphatic: Moderate aortoiliac atherosclerosis without aneurysm or adenopathy. Reproductive: Status post hysterectomy. No adnexal masses. Other: No free air nor free fluid. Musculoskeletal: No worrisome lytic or sclerotic osseous lesions. Inferior endplate compression of L1 is chronic. Diffuse degenerative disc disease of the lumbar spine. IMPRESSION: 1. Circular muscle hypertrophy and extensive sigmoid diverticulosis without acute diverticulitis. The diverticulosis may be contributing to the patient's rectal bleeding. 2. Stable interpolar left renal enhancing lesion though stable since 2012 suggesting a benign finding. 3. Nonobstructing left lower pole renal calculus measuring approximately 3 mm. 4. Stable subcentimeter left hepatic hypodensities statistically consistent with cysts or hemangiomata. 5. Aortoiliac atherosclerosis without aneurysm. Electronically Signed   By: Ashley Royalty M.D.   On: 05/12/2017 18:53    Time Spent in minutes  30   Lala Lund M.D on 05/13/2017 at 12:53 PM  Between 7am to 7pm - Pager - [phone] ( page via  [email or web address], text pages only, please mention full 10 digit call back number). After 7pm go to [email or web address] -  password [email or web address]

## 2017-05-13 NOTE — Clinical Social Work Note (Signed)
Clinical Social Work Assessment  Patient Details  Name: Tracy Sharp MRN: [id number] Date of Birth: [birth year]  Date of referral:  05/13/17               Reason for consult:  Facility Placement                Permission sought to share information with:  Case Manager, Customer service manager, Family Supports Permission granted to share information::  Yes, Verbal Permission Granted  Name::     Personal assistant::  [employer]  Relationship::  Guardian  Contact Information:     Housing/Transportation Living arrangements for the past 2 months:  [place] of Information:  Patient, Guardian Patient Interpreter Needed:  None Criminal Activity/Legal Involvement Pertinent to Current Situation/Hospitalization:  No - Comment as needed Significant Relationships:  Adult Children, Warehouse manager, Other Family Members Lives with:  Facility Resident Do you feel safe going back to the place where you live?  Yes Need for family participation in patient care:  Yes (Comment)  Care giving concerns:  No care giving concerns at the time of assessment.    Social Worker assessment / plan:  LCSW following for SNF placement.   Patient from [facility].   Patient under observation status for lower GI bleed.   LCSW met at bedside with patient. No family present.   LCSW explained role and reason for visit.   Patient reports that she has been at [facility] less than a year. Patient states that she is in a wheel chair at the facility, but was previously using a walker. Patient states poor balance is the reason she is using a wheel chair.  Patient needs assistance with ADLs.   LCSW spoke with patient's guardian, [name]. [name] confirms that patient will return to facility at dc. [name] asked questions regarding patients observation status and if she would be changed to inpatient. [name] also had questions about patients dc. LCSW acknowledged families concerns and  encouraged [name] to speak with patient's attending.   PLAN: [employer] at [employer].    Employment status:  Retired Forensic scientist:  Medicare PT Recommendations:    Information / Referral to community resources:     Patient/Family's Response to care:  Family is accepting and proactive in patients care. Patient and family are thankful for LCSW visit.   Patient/Family's Understanding of and Emotional Response to Diagnosis, Current Treatment, and Prognosis:  Patient and family understanding of diagnosis and agreeable to SNF at dc. Patient asked questions about PT and stated that she feels PT will help her be able to use her walker again. Patient stated she was receiving PT when she first entered the facility, but they have since stopped.   Emotional Assessment Appearance:  Appears stated age Attitude/Demeanor/Rapport:    Affect (typically observed):  Accepting, Calm, Pleasant Orientation:  Oriented to Self, Oriented to Place, Oriented to  Time, Oriented to Situation Alcohol / Substance use:  Not Applicable Psych involvement (Current and /or in the community):  No (Comment)  Discharge Needs  Concerns to be addressed:  No discharge needs identified Readmission within the last 30 days:  No Current discharge risk:  None Barriers to Discharge:  Continued Medical Work up   [employer], LCSW 05/13/2017, 12:30 PM
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
## 2017-05-14 ENCOUNTER — Encounter (HOSPITAL_COMMUNITY): Payer: Self-pay | Admitting: Diagnostic Radiology

## 2017-05-14 DIAGNOSIS — K5791 Diverticulosis of intestine, part unspecified, without perforation or abscess with bleeding: Secondary | ICD-10-CM

## 2017-05-14 DIAGNOSIS — M05712 Rheumatoid arthritis with rheumatoid factor of left shoulder without organ or systems involvement: Secondary | ICD-10-CM

## 2017-05-14 LAB — BASIC METABOLIC PANEL
ANION GAP: 9 (ref 5–15)
BUN: 15 mg/dL (ref 6–20)
CALCIUM: 8.1 mg/dL — AB (ref 8.9–10.3)
CO2: 22 mmol/L (ref 22–32)
Chloride: 107 mmol/L (ref 101–111)
Creatinine, Ser: 0.5 mg/dL (ref 0.44–1.00)
GFR calc Af Amer: >60 mL/min (ref >60)
GFR calc non Af Amer: >60 mL/min (ref >60)
Glucose, Bld: 114 mg/dL — ABNORMAL HIGH (ref 65–99)
POTASSIUM: 4 mmol/L (ref 3.5–5.1)
Sodium: 138 mmol/L (ref 135–145)

## 2017-05-14 LAB — HEMOGLOBIN AND HEMATOCRIT, BLOOD
HCT: 26.9 % — ABNORMAL LOW (ref 36.0–46.0)
HCT: 25.6 % — ABNORMAL LOW (ref 36.0–46.0)
HEMATOCRIT: 27.8 % — AB (ref 36.0–46.0)
HEMOGLOBIN: 8.5 g/dL — AB (ref 12.0–15.0)
Hemoglobin: 8.9 g/dL — ABNORMAL LOW (ref 12.0–15.0)
Hemoglobin: 8.4 g/dL — ABNORMAL LOW (ref 12.0–15.0)

## 2017-05-14 LAB — CBC
HEMATOCRIT: 27.3 % — AB (ref 36.0–46.0)
HEMOGLOBIN: 9 g/dL — AB (ref 12.0–15.0)
MCH: 31.1 pg (ref 26.0–34.0)
MCHC: 33 g/dL (ref 30.0–36.0)
MCV: 94.5 fL (ref 78.0–100.0)
Platelets: 179 10*3/uL (ref 150–400)
RBC: 2.89 MIL/uL — ABNORMAL LOW (ref 3.87–5.11)
RDW: 13.8 % (ref 11.5–15.5)
WBC: 7.2 10*3/uL (ref 4.0–10.5)

## 2017-05-14 NOTE — Progress Notes (Addendum)
`Patient ID: Tracy Sharp, female   DOB: 09/20/1924, 84 y.o.   MRN: [id number]    Progress Note   Subjective   Feels ok - weak . No c/o abdominal pain- no CP, no SOB No stools  Overnight or this am HGB down to 9.0 Bleeding scan done last night at 7pm - + in sigmoid colon Angio this am - result pending    Objective   Vital signs in last 24 hours: Temp:  [97.3 F (36.3 C)-98.1 F (36.7 C)] 97.3 F (36.3 C) (04/14 0049) Pulse Rate:  [69-82] 79 (04/14 0730) Resp:  [13-23] 18 (04/14 0635) BP: (92-137)/(50-71) 109/58 (04/14 0730) SpO2:  [96 %-100 %] 100 % (04/14 0730) Last BM Date: 05/13/17 General:    Elderly white female in NAD Heart:  Regular rate and rhythm; no murmurs Lungs: Respirations even and unlabored, lungs CTA bilaterally Abdomen:  Soft, nontender and nondistended. Normal bowel sounds. Extremities:  Without edema. Neurologic:  Alert and oriented,  grossly normal neurologically. Psych:  Cooperative. Normal mood and affect.  Intake/Output from previous day: 04/13 0701 - 04/14 0700 In: 1160 [P.O.:1160] Out: -  Intake/Output this shift: No intake/output data recorded.  Lab Results: Recent Labs    05/13/17 1410 05/13/17 2248 05/14/17 0537  WBC 7.4 6.9 7.2  HGB 10.7* 9.8* 9.0*  HCT 33.5* 30.4* 27.3*  PLT 183 192 179   BMET Recent Labs    05/12/17 1611 05/14/17 0537  NA 142 138  K 4.0 4.0  CL 105 107  CO2 28 22  GLUCOSE 98 114*  BUN 19 15  CREATININE 0.64 0.50  CALCIUM 9.2 8.1*   LFT Recent Labs    05/12/17 1611  PROT 6.3*  ALBUMIN 3.0*  AST 14*  ALT 10*  ALKPHOS 52  BILITOT 0.4   PT/INR Recent Labs    05/13/17 1534  LABPROT 13.7  INR 1.06    Studies/Results: Nm Gi Blood Loss  Result Date: 05/13/2017 CLINICAL DATA:  GI bleed. EXAM: NUCLEAR MEDICINE GASTROINTESTINAL BLEEDING SCAN TECHNIQUE: Sequential abdominal images were obtained following intravenous administration of Tc-83m labeled red blood cells. RADIOPHARMACEUTICALS:  28.6 mCi  Tc-40m pertechnetate in-vitro labeled red cells. COMPARISON:  None. FINDINGS: Imaging obtained for 120 minutes. Tracer activity appears in the mid pelvis superior to the bladder at approximately 52 minutes. There is some to and fro movement with increased intensity at the end of 2 hours. IMPRESSION: Findings suspicious for active GI bleed in the sigmoid colon. Electronically Signed   By: Jeb Levering M.D.   On: 05/13/2017 21:59   Ct Abdomen Pelvis W Contrast  Result Date: 05/12/2017 CLINICAL DATA:  Rectal bleeding x2 today. EXAM: CT ABDOMEN AND PELVIS WITH CONTRAST TECHNIQUE: Multidetector CT imaging of the abdomen and pelvis was performed using the standard protocol following bolus administration of intravenous contrast. CONTRAST:  142mL ISOVUE-300 IOPAMIDOL (ISOVUE-300) INJECTION 61% COMPARISON:  07/21/2015, 09/04/2010 CT FINDINGS: Lower chest: Cardiomegaly with coronary arteriosclerosis. No pericardial effusion. Small hiatal hernia. Subpleural atelectasis and/or scarring.8 mm noncalcified nodule in the right lower lobe, dating back to 2000 12 compatible with a benign finding. Bronchiectasis noted in both lower lobes. Hepatobiliary: Status post cholecystectomy. Reservoir effect from prior cholecystectomy accounting for mild intrahepatic and extrahepatic ductal dilatation without stones. Stable 10 and 7 mm hypodensities in the left hepatic lobe are unchanged in appearance from 2017 comparison. These are consistent with benign findings. Pancreas: No focal mass, ductal dilatation or inflammation. Spleen: Normal size spleen without mass. Adrenals/Urinary Tract: No adrenal  mass. Stable appearance of the right kidney without acute abnormality. Exophytic hyperdense 12 mm left renal mass is unchanged off the interpolar aspect dating back to 2012 comparison. Nonobstructing 3 mm left lower pole renal calculus. No hydroureteronephrosis. The urinary bladder is unremarkable for degree of distention. Stomach/Bowel:  Small hiatal hernia. Decompressed stomach with normal small bowel rotation and no obstruction. Status post appendectomy by report. No acute large bowel obstruction. Circular muscle hypertrophy and extensive sigmoid diverticulosis is noted without acute diverticulitis. Vascular/Lymphatic: Moderate aortoiliac atherosclerosis without aneurysm or adenopathy. Reproductive: Status post hysterectomy. No adnexal masses. Other: No free air nor free fluid. Musculoskeletal: No worrisome lytic or sclerotic osseous lesions. Inferior endplate compression of L1 is chronic. Diffuse degenerative disc disease of the lumbar spine. IMPRESSION: 1. Circular muscle hypertrophy and extensive sigmoid diverticulosis without acute diverticulitis. The diverticulosis may be contributing to the patient's rectal bleeding. 2. Stable interpolar left renal enhancing lesion though stable since 2012 suggesting a benign finding. 3. Nonobstructing left lower pole renal calculus measuring approximately 3 mm. 4. Stable subcentimeter left hepatic hypodensities statistically consistent with cysts or hemangiomata. 5. Aortoiliac atherosclerosis without aneurysm. Electronically Signed   By: Ashley Royalty M.D.   On: 05/12/2017 18:53       Assessment / Plan:    #1 84 yo female nursing home resident admitted with bloody stools on 4/12- painless, and previously documented diverticulosis  Bleed c/w acute diverticular hemorrhage   CT on admit also confirmed multiple diverticuli  Pt had active bleeding late yesterday - NM bleeding scan + in sigmoid Colon Angio was done earlier this am -report pending - she has not had actve bleeding overnight so suspect will be negative   #2 Anemia -secondary to acute blood loss - if hgb drifts any further would transfuse -secondary yo advanced age and symptomatic   Plan; Full liquids Transfuse for hgb 8.5 or less  Will f/u angio result     Contact  Amy [place], P.A.-C               [phone]      Attending physician's note   I have taken an interval history, reviewed the chart and examined the patient. I agree with the Advanced Practitioner's note, impression and recommendations.  Discussed with the patient and patient's family.  84 year old with likely diverticular bleed.  Positive RBC scan but negative angiography.  No further active bleeding.  Plan: Trend CBC, monitor for any recurrent bleeding, if she has any recurrent bleeding then will consider colonoscopy.  Patient would like to avoid colonoscopy.  Carmell Austria, MD

## 2017-05-14 NOTE — Sedation Documentation (Signed)
5 Fr. Exoseal to right groin 

## 2017-05-14 NOTE — Plan of Care (Signed)
  Problem: Activity: Goal: Risk for activity intolerance will decrease Outcome: Progressing   Problem: Coping: Goal: Level of anxiety will decrease Outcome: Progressing   Problem: Elimination: Goal: Will not experience complications related to bowel motility Outcome: Progressing Goal: Will not experience complications related to urinary retention Outcome: Progressing   Problem: Pain Managment: Goal: General experience of comfort will improve Outcome: Progressing   Problem: Safety: Goal: Ability to remain free from injury will improve Outcome: Progressing   Problem: Skin Integrity: Goal: Risk for impaired skin integrity will decrease Outcome: Progressing   Problem: Education: Goal: Ability to identify signs and symptoms of gastrointestinal bleeding will improve Outcome: Progressing   Problem: Bowel/Gastric: Goal: Will show no signs and symptoms of gastrointestinal bleeding Outcome: Progressing   Problem: Fluid Volume: Goal: Will show no signs and symptoms of excessive bleeding Outcome: Progressing   Problem: Clinical Measurements: Goal: Complications related to the disease process, condition or treatment will be avoided or minimized Outcome: Progressing

## 2017-05-14 NOTE — Progress Notes (Addendum)
Referring Physician(s): Dr. Lyndel Safe  Supervising Physician: Markus Daft  Patient Status:  [facility] - In-pt  Chief Complaint: GI Bleed  Subjective: Patient resting in bed.  Complains of headache which has improved with pain medicine.  States she had one small watery/bloody bowel movement today.   Allergies: Doxycycline; Hydrocodone; Norco [hydrocodone-acetaminophen]; Toviaz [fesoterodine fumarate er]; and Plaquenil [hydroxychloroquine]  Medications: Prior to Admission medications   Medication Sig Start Date End Date Taking? Authorizing Provider  Cholecalciferol (VITAMIN D) 2000 units tablet Take 2,000 Units by mouth daily.   Yes [provider]  cholestyramine light (PREVALITE) 4 GM/DOSE powder Take 4 g by mouth daily.   Yes [provider]  folic acid (FOLVITE) 1 MG tablet Take 1 mg by mouth daily.   Yes [provider]  Lactobacillus (ACIDOPHILUS PO) Take 1 tablet by mouth daily.   Yes [provider]  Melatonin 5 MG TABS Take 5 mg by mouth at bedtime.    Yes [provider]  methotrexate (RHEUMATREX) 2.5 MG tablet Take 10 mg by mouth See admin instructions. Take 10 mg ( 4 tablets ) once a week on wednesdays 09/03/14  Yes [provider]  mirabegron ER (MYRBETRIQ) 50 MG TB24 tablet Take 50 mg by mouth daily.    Yes [provider]  multivitamin-lutein (OCUVITE-LUTEIN) CAPS capsule Take 1 capsule by mouth daily.   Yes [provider]  OXYGEN Inhale 2 L/min into the lungs continuous.   Yes [provider]  Polyethyl Glycol-Propyl Glycol (SYSTANE) 0.4-0.3 % GEL ophthalmic gel Instil 2 drops in both eyes two times daily for dry eyes   Yes [provider]  risperiDONE (RISPERDAL) 0.5 MG tablet Take 0.5 mg by mouth 2 (two) times daily.  11/25/16  Yes [provider]  sertraline (ZOLOFT) 100 MG tablet Take 100 mg by mouth daily.  11/24/16  Yes [provider]  traMADol (ULTRAM) 50 MG  tablet Give 0.5 tablet by mouth two times daily for pain management 01/17/17  Yes Gerlene Fee, NP  aspirin EC 81 MG tablet Take 81 mg by mouth daily.    [provider]  diclofenac sodium (VOLTAREN) 1 % GEL Apply 4 gram transdermally every 6 hours for leg pain    [provider]  docusate sodium (COLACE) 100 MG capsule Take 100 mg by mouth daily as needed for mild constipation.    [provider]     Vital Signs: BP 120/62 (BP Location: Right Arm)   Pulse 85   Temp 97.7 F (36.5 C) (Oral)   Resp 18   Ht 5\' 2"  (1.575 m)   Wt 167 lb 1.7 oz (75.8 kg)   SpO2 100%   BMI 30.56 kg/m   Physical Exam  NAD, alert, resting in bed.  Abdomen: soft, non-tender, non-distended.  Groin:  Soft, intact, no bleeding or bruising.  No evidence of hematoma or pseudoaneurysm.   Imaging: Nm Gi Blood Loss  Result Date: 05/13/2017 CLINICAL DATA:  GI bleed. EXAM: NUCLEAR MEDICINE GASTROINTESTINAL BLEEDING SCAN TECHNIQUE: Sequential abdominal images were obtained following intravenous administration of Tc-80m labeled red blood cells. RADIOPHARMACEUTICALS:  28.6 mCi Tc-31m pertechnetate in-vitro labeled red cells. COMPARISON:  None. FINDINGS: Imaging obtained for 120 minutes. Tracer activity appears in the mid pelvis superior to the bladder at approximately 52 minutes. There is some to and fro movement with increased intensity at the end of 2 hours. IMPRESSION: Findings suspicious for active GI bleed in the sigmoid colon. Electronically Signed  By: Jeb Levering M.D.   On: 05/13/2017 21:59   Ct Abdomen Pelvis W Contrast  Result Date: 05/12/2017 CLINICAL DATA:  Rectal bleeding x2 today. EXAM: CT ABDOMEN AND PELVIS WITH CONTRAST TECHNIQUE: Multidetector CT imaging of the abdomen and pelvis was performed using the standard protocol following bolus administration of intravenous contrast. CONTRAST:  121mL ISOVUE-300 IOPAMIDOL (ISOVUE-300) INJECTION 61% COMPARISON:  07/21/2015,  09/04/2010 CT FINDINGS: Lower chest: Cardiomegaly with coronary arteriosclerosis. No pericardial effusion. Small hiatal hernia. Subpleural atelectasis and/or scarring.8 mm noncalcified nodule in the right lower lobe, dating back to 2000 12 compatible with a benign finding. Bronchiectasis noted in both lower lobes. Hepatobiliary: Status post cholecystectomy. Reservoir effect from prior cholecystectomy accounting for mild intrahepatic and extrahepatic ductal dilatation without stones. Stable 10 and 7 mm hypodensities in the left hepatic lobe are unchanged in appearance from 2017 comparison. These are consistent with benign findings. Pancreas: No focal mass, ductal dilatation or inflammation. Spleen: Normal size spleen without mass. Adrenals/Urinary Tract: No adrenal mass. Stable appearance of the right kidney without acute abnormality. Exophytic hyperdense 12 mm left renal mass is unchanged off the interpolar aspect dating back to 2012 comparison. Nonobstructing 3 mm left lower pole renal calculus. No hydroureteronephrosis. The urinary bladder is unremarkable for degree of distention. Stomach/Bowel: Small hiatal hernia. Decompressed stomach with normal small bowel rotation and no obstruction. Status post appendectomy by report. No acute large bowel obstruction. Circular muscle hypertrophy and extensive sigmoid diverticulosis is noted without acute diverticulitis. Vascular/Lymphatic: Moderate aortoiliac atherosclerosis without aneurysm or adenopathy. Reproductive: Status post hysterectomy. No adnexal masses. Other: No free air nor free fluid. Musculoskeletal: No worrisome lytic or sclerotic osseous lesions. Inferior endplate compression of L1 is chronic. Diffuse degenerative disc disease of the lumbar spine. IMPRESSION: 1. Circular muscle hypertrophy and extensive sigmoid diverticulosis without acute diverticulitis. The diverticulosis may be contributing to the patient's rectal bleeding. 2. Stable interpolar left renal  enhancing lesion though stable since 2012 suggesting a benign finding. 3. Nonobstructing left lower pole renal calculus measuring approximately 3 mm. 4. Stable subcentimeter left hepatic hypodensities statistically consistent with cysts or hemangiomata. 5. Aortoiliac atherosclerosis without aneurysm. Electronically Signed   By: Ashley Royalty M.D.   On: 05/12/2017 18:53   Ir Angiogram Visceral Selective  Result Date: 05/14/2017 INDICATION: 84 year old with GI bleeding. Positive nuclear medicine study. Nuclear medicine study suggested that the source of bleeding was in the sigmoid colon. EXAM: MESENTERIC ARTERIOGRAPHY: SELECTIVE ANGIOGRAPHY OF THE SUPERIOR MESENTERIC ARTERY AND SELECTIVE ANGIOGRAPHY OF THE INFERIOR MESENTERIC ARTERY ULTRASOUND GUIDANCE FOR VASCULAR ACCESS MEDICATIONS: None ANESTHESIA/SEDATION: Moderate (conscious) sedation was employed during this procedure. A total of Versed 0.5 mg and Fentanyl 25 mcg was administered intravenously. Moderate Sedation Time: 42 minutes. The patient's level of consciousness and vital signs were monitored continuously by radiology nursing throughout the procedure under my direct supervision. CONTRAST:  121 mL Isovue-300 FLUOROSCOPY TIME:  Fluoroscopy Time: 7 minutes 36 seconds (553 mGy). COMPLICATIONS: None immediate. PROCEDURE: Informed consent was obtained from the patient's POA following explanation of the procedure, risks, benefits and alternatives. The patient and patient's POA understands, agrees and consents for the procedure. All questions were addressed. A time out was performed prior to the initiation of the procedure. Maximal barrier sterile technique utilized including caps, mask, sterile gowns, sterile gloves, large sterile drape, hand hygiene, and Betadine prep. Ultrasound confirmed a patent right common femoral artery. Palpable pulses in the right posterior tibial artery and right dorsalis pedis artery prior to the procedure. The right groin was  prepped  and draped in a sterile fashion. Maximal barrier sterile technique was utilized including caps, mask, sterile gowns, sterile gloves, sterile drape, hand hygiene and skin antiseptic. Right groin was anesthetized with 1% lidocaine. Using ultrasound guidance, 21 gauge needle directed into the right common femoral artery. Micropuncture dilator set and 5 French vascular sheath were placed. The inferior mesenteric artery was successfully cannulated with a Sos catheter. Multiple angiograms performed in the inferior mesenteric artery. Oblique images were obtained at the area of concern in the pelvis and sigmoid colon. The catheter was then used to cannulate the superior mesenteric artery. Superior mesenteric arteriogram was performed. The 5 French catheter was removed over a wire. Angiogram was performed through the right groin sheath. The right groin sheath was removed using an ExoSeal closure device. Hemostasis in the groin following removal of the catheter. Fluoroscopic images were taken and saved for this procedure. Ultrasound confirmed a patent right common femoral artery and an image was saved for documentation. FINDINGS: Inferior mesenteric artery. Inferior mesenteric artery is patent. Inferior mesenteric vein is patent. There is no evidence for contrast extravasation or active bleeding. No evidence for vascular malformation. Redundant sigmoid colon in the pelvis makes it difficult to evaluate this area. However, no acute abnormality identified with the oblique imaging. Superior mesenteric artery: SMA is patent. Incidentally, there is a replaced right hepatic artery coming off the proximal SMA. Portal venous system is patent. No contrast extravasation and no vascular malformation. IMPRESSION: Negative arteriography of the inferior mesenteric artery and superior mesenteric artery. No evidence for active bleeding or vascular malformation. Electronically Signed   By: Markus Daft M.D.   On: 05/14/2017 08:55   Ir US  Guide Vasc Access Right  Result Date: 05/14/2017 INDICATION: 84 year old with GI bleeding. Positive nuclear medicine study. Nuclear medicine study suggested that the source of bleeding was in the sigmoid colon. EXAM: MESENTERIC ARTERIOGRAPHY: SELECTIVE ANGIOGRAPHY OF THE SUPERIOR MESENTERIC ARTERY AND SELECTIVE ANGIOGRAPHY OF THE INFERIOR MESENTERIC ARTERY ULTRASOUND GUIDANCE FOR VASCULAR ACCESS MEDICATIONS: None ANESTHESIA/SEDATION: Moderate (conscious) sedation was employed during this procedure. A total of Versed 0.5 mg and Fentanyl 25 mcg was administered intravenously. Moderate Sedation Time: 42 minutes. The patient's level of consciousness and vital signs were monitored continuously by radiology nursing throughout the procedure under my direct supervision. CONTRAST:  121 mL Isovue-300 FLUOROSCOPY TIME:  Fluoroscopy Time: 7 minutes 36 seconds (553 mGy). COMPLICATIONS: None immediate. PROCEDURE: Informed consent was obtained from the patient's POA following explanation of the procedure, risks, benefits and alternatives. The patient and patient's POA understands, agrees and consents for the procedure. All questions were addressed. A time out was performed prior to the initiation of the procedure. Maximal barrier sterile technique utilized including caps, mask, sterile gowns, sterile gloves, large sterile drape, hand hygiene, and Betadine prep. Ultrasound confirmed a patent right common femoral artery. Palpable pulses in the right posterior tibial artery and right dorsalis pedis artery prior to the procedure. The right groin was prepped and draped in a sterile fashion. Maximal barrier sterile technique was utilized including caps, mask, sterile gowns, sterile gloves, sterile drape, hand hygiene and skin antiseptic. Right groin was anesthetized with 1% lidocaine. Using ultrasound guidance, 21 gauge needle directed into the right common femoral artery. Micropuncture dilator set and 5 French vascular sheath were  placed. The inferior mesenteric artery was successfully cannulated with a Sos catheter. Multiple angiograms performed in the inferior mesenteric artery. Oblique images were obtained at the area of concern in the pelvis and sigmoid colon.  The catheter was then used to cannulate the superior mesenteric artery. Superior mesenteric arteriogram was performed. The 5 French catheter was removed over a wire. Angiogram was performed through the right groin sheath. The right groin sheath was removed using an ExoSeal closure device. Hemostasis in the groin following removal of the catheter. Fluoroscopic images were taken and saved for this procedure. Ultrasound confirmed a patent right common femoral artery and an image was saved for documentation. FINDINGS: Inferior mesenteric artery. Inferior mesenteric artery is patent. Inferior mesenteric vein is patent. There is no evidence for contrast extravasation or active bleeding. No evidence for vascular malformation. Redundant sigmoid colon in the pelvis makes it difficult to evaluate this area. However, no acute abnormality identified with the oblique imaging. Superior mesenteric artery: SMA is patent. Incidentally, there is a replaced right hepatic artery coming off the proximal SMA. Portal venous system is patent. No contrast extravasation and no vascular malformation. IMPRESSION: Negative arteriography of the inferior mesenteric artery and superior mesenteric artery. No evidence for active bleeding or vascular malformation. Electronically Signed   By: Markus Daft M.D.   On: 05/14/2017 08:55    Labs:  CBC: Recent Labs    05/13/17 0539 05/13/17 1410 05/13/17 2248 05/14/17 0537 05/14/17 1213  WBC 5.5 7.4 6.9 7.2  --   HGB 10.0* 10.7* 9.8* 9.0* 8.9*  HCT 31.5* 33.5* 30.4* 27.3* 27.8*  PLT 197 183 192 179  --     COAGS: Recent Labs    05/13/17 1534  INR 1.06    BMP: Recent Labs    07/27/16 0336 07/28/16 0348 10/31/16 0010 01/27/17 02/28/17  05/12/17 1611 05/14/17 0537  NA 139  --  140 144 141 142 138  K 4.0  --  3.6 4.6 4.1 4.0 4.0  CL 105  --  105  --   --  105 107  CO2 29  --  28  --   --  28 22  GLUCOSE 113*  --  93  --   --  98 114*  BUN 17  --  13 18 14 19 15   CALCIUM 8.8*  --  9.4  --   --  9.2 8.1*  CREATININE 0.45 0.59 0.67 0.6 0.5 0.64 0.50  GFRNONAA >60 >60 >60  --   --  >60 >60  GFRAA >60 >60 >60  --   --  >60 >60    LIVER FUNCTION TESTS: Recent Labs    07/24/16 1641 07/27/16 0336 10/31/16 0010 01/27/17 02/28/17 05/12/17 1611  BILITOT 1.0 0.4 0.5  --   --  0.4  AST 17 12* 15 11* 8* 14*  ALT 14 11* 10* 8 5* 10*  ALKPHOS 171* 156* 82 67 87 52  PROT 6.5 6.0* 6.6  --   --  6.3*  ALBUMIN 3.0* 2.7* 2.9*  --   --  3.0*    Assessment and Plan: GI bleed s/p mesenteric angiogram overnight which showed no active bleeding.  Patient HgB decreased to 9.0 this AM, now stable at 8.9.  SCr stable at 0.50 RN states she has actually had 2 episodes of dark melena today.  No BRB.   Groin soft, intact.  IR to follow.   Electronically Signed: Docia Barrier, PA 05/14/2017, 2:31 PM   I spent a total of 15 Minutes at the the patient's bedside AND on the patient's hospital floor or unit, greater than 50% of which was counseling/coordinating care for GI bleed.

## 2017-05-14 NOTE — Procedures (Signed)
  Pre-operative Diagnosis: GI bleed, positive bleeding scan        Post-operative Diagnosis: GI bleed, source of bleeding not identified   Indications: GI bleeding and positive bleeding scan  Procedure: Mesenteric arteriogram  Findings: IMA and SMA arteriograms.  No active bleeding.  Portal venous system is patent  Complications: None     EBL: Minimal  Plan: Return to inpatient bed and bedrest tonight.  Follow CBC.

## 2017-05-14 NOTE — Progress Notes (Signed)
Patient with moderate amount of dark red stool (makes a total of 3 for 7 a to 7 p shift).  Hemoglobin being drawn q6 hours.

## 2017-05-14 NOTE — Plan of Care (Signed)
Patient with 2 episodes on 7 a to 7 p shift of maroon colored foul smelling stool.  VSS.   Patient c/o some dizziness when sitting up in chair but was able to transfer from bed to chair with one assist and walker.  Daughter in law and son in room for part of shift.

## 2017-05-14 NOTE — Progress Notes (Signed)
@IPLOG @        PROGRESS NOTE                                                                                                                                                                                                             Patient Demographics:    Tracy Sharp, is a 84 y.o. female, DOB - 08-Feb-1924, YPP:[id number]  Admit date - 05/12/2017   Admitting Physician Ejiroghene Arlyce Dice, MD  Outpatient Primary MD for the patient is [name], DO  LOS - 1  Chief Complaint  Patient presents with  . Rectal Bleeding       Brief Narrative   Tracy Sharp is a 84 y.o. female with medical history significant Schizophrenia, RA, Depression, HTN, HLD, who was brought to ED with c/o rectal bleed that started today. Bright red blood with clots, twice today at the nursing home.  Also with suprapubic pain intermittent over the past several weeks per patient, but worse over the past 2-3 days. she reports chronic intermittent dizziness/vertigo, but none today.  No chest pain.  No fever or chills.  He denies dysuria.  Patient is on chronic 2 L O2 at the nursing home.  Patient is on daily baby aspirin.  ED Course: Stable vitals in the ED systolic blood pressure 458K to 160, regular pulse 60s.  In ED patient had 2 more bloody bowel movements mostly clots.  Hemoglobin stable at 11.4.  Platelets 215.  Unremarkable lytes and cr.  CT abdomen and pelvis W contrast  revealed extensive diverticulosis, acute diverticulitis. GI was called in the ED by EDP.  Pathologist was called to admit for Lower GI bleed.     Subjective:    Patient in bed, appears comfortable, denies any headache, no fever, no chest pain or pressure, no shortness of breath , no abdominal pain. No focal weakness.  One more small bloody bowel movement afternoon 05/14/2017 we will continue to monitor.   Assessment  & Plan :    Lower GI bleed- this likely is painless acute lower GI diverticular bleed, mild blood loss related anemia but  overall hemodynamically stable, continue to monitor H&H every 6 hours, continue clear liquid diet and bowel rest, GI on board Case discussed with Dr. Lyndel Safe on 05/14/2017.  She also had a tagged RBC scan on 05/13/2017 showing possible sigmoid area bleeding but later when an angiogram was done by IR for attempted embolization no specific bleeding site could be identified.  Continue supportive care, transfuse if hemoglobin drops below 8,  likely bleeding will stop in the next day or so and we can avoid surgery.   Schizophrenia-appears stable.  Per daughter-in-law no significant memory issues. - cont home risperidone  Rheumatoid arthritis-stable.  Diffuse small joint deformities.  - resume home methotrexate on d/c    Diet : Diet full liquid Room service appropriate? Yes; Fluid consistency: Thin    Family Communication  :  None  Code Status :  Partial  Disposition Plan  : Home once beeding has resolved  Consults  :  GI  Procedures  :    Tagged RBC scan.  Possible bleeding in the sigmoid area.    Attempted embolization by IR with angiogram.  Could not identify any acute bleeding.    CT - IMPRESSION: 1. Circular muscle hypertrophy and extensive sigmoid diverticulosis without acute diverticulitis. The diverticulosis may be contributing to the patient's rectal bleeding. 2. Stable interpolar left renal enhancing lesion though stable since 2012 suggesting a benign finding. 3. Nonobstructing left lower pole renal calculus measuring approximately 3 mm. 4. Stable subcentimeter left hepatic hypodensities statistically consistent with cysts or hemangiomata. 5. Aortoiliac atherosclerosis without aneurysm.  DVT Prophylaxis  :   SCDs   Lab Results  Component Value Date   PLT 179 05/14/2017    Inpatient Medications  Scheduled Meds: . acidophilus  1 capsule Oral Daily  . mirabegron ER  50 mg Oral Daily  . risperiDONE  0.5 mg Oral BID  . sertraline  100 mg Oral Daily   Continuous Infusions: PRN  Meds:.ondansetron **OR** ondansetron (ZOFRAN) IV, traMADol  Antibiotics  :    Anti-infectives (From admission, onward)   None         Objective:   Vitals:   05/14/17 0635 05/14/17 0730 05/14/17 1253 05/14/17 1327  BP: 120/61 (!) 109/58 115/78 120/62  Pulse: 71 79 99 85  Resp: 18   18  Temp:    97.7 F (36.5 C)  TempSrc:    Oral  SpO2: 100% 100% 100% 100%  Weight:      Height:        Wt Readings from Last 3 Encounters:  05/12/17 75.8 kg (167 lb 1.7 oz)  05/08/17 74.5 kg (164 lb 3.2 oz)  03/30/17 69.4 kg (153 lb)     Intake/Output Summary (Last 24 hours) at 05/14/2017 1442 Last data filed at 05/14/2017 0941 Gross per 24 hour  Intake 960 ml  Output -  Net 960 ml     Physical Exam  Awake Alert,  No new F.N deficits, Normal affect [place].AT,PERRAL Supple Neck,No JVD, No cervical lymphadenopathy appriciated.  Symmetrical Chest wall movement, Good air movement bilaterally, CTAB RRR,No Gallops,Rubs or new Murmurs, No Parasternal Heave +ve B.Sounds, Abd Soft, No tenderness, No organomegaly appriciated, No rebound - guarding or rigidity. No Cyanosis, Clubbing or edema, No new Rash or bruise       Data Review:    CBC Recent Labs  Lab 05/12/17 1611  05/13/17 0539 05/13/17 1410 05/13/17 2248 05/14/17 0537 05/14/17 1213  WBC 6.9  --  5.5 7.4 6.9 7.2  --   HGB 11.4*   < > 10.0* 10.7* 9.8* 9.0* 8.9*  HCT 36.5   < > 31.5* 33.5* 30.4* 27.3* 27.8*  PLT 215  --  197 183 192 179  --   MCV 95.3  --  95.5 94.1 94.7 94.5  --   MCH 29.8  --  30.3 30.1 30.5 31.1  --   MCHC 31.2  --  31.7 31.9 32.2  33.0  --   RDW 13.8  --  14.0 13.6 13.7 13.8  --    < > = values in this interval not displayed.    Chemistries  Recent Labs  Lab 05/12/17 1611 05/14/17 0537  NA 142 138  K 4.0 4.0  CL 105 107  CO2 28 22  GLUCOSE 98 114*  BUN 19 15  CREATININE 0.64 0.50  CALCIUM 9.2 8.1*  AST 14*  --   ALT 10*  --   ALKPHOS 52  --   BILITOT 0.4  --     ------------------------------------------------------------------------------------------------------------------ No results for input(s): CHOL, HDL, LDLCALC, TRIG, CHOLHDL, LDLDIRECT in the last 72 hours.  No results found for: HGBA1C ------------------------------------------------------------------------------------------------------------------ No results for input(s): TSH, T4TOTAL, T3FREE, THYROIDAB in the last 72 hours.  Invalid input(s): FREET3 ------------------------------------------------------------------------------------------------------------------ No results for input(s): VITAMINB12, FOLATE, FERRITIN, TIBC, IRON, RETICCTPCT in the last 72 hours.  Coagulation profile Recent Labs  Lab 05/13/17 1534  INR 1.06    No results for input(s): DDIMER in the last 72 hours.  Cardiac Enzymes No results for input(s): CKMB, TROPONINI, MYOGLOBIN in the last 168 hours.  Invalid input(s): CK ------------------------------------------------------------------------------------------------------------------    Component Value Date/Time   BNP 79.2 09/16/2014 2344    Micro Results Recent Results (from the past 240 hour(s))  MRSA PCR Screening     Status: None   Collection Time: 05/13/17  7:29 AM  Result Value Ref Range Status   MRSA by PCR NEGATIVE NEGATIVE Final    Comment:        The GeneXpert MRSA Assay (FDA approved for NASAL specimens only), is one component of a comprehensive MRSA colonization surveillance program. It is not intended to diagnose MRSA infection nor to guide or monitor treatment for MRSA infections. Performed at [facility], [place] [address] [place], [place] [address]     Radiology Reports Nm Gi Blood Loss  Result Date: 05/13/2017 CLINICAL DATA:  GI bleed. EXAM: NUCLEAR MEDICINE GASTROINTESTINAL BLEEDING SCAN TECHNIQUE: Sequential abdominal images were obtained following intravenous administration of Tc-56m labeled red blood  cells. RADIOPHARMACEUTICALS:  28.6 mCi Tc-65m pertechnetate in-vitro labeled red cells. COMPARISON:  None. FINDINGS: Imaging obtained for 120 minutes. Tracer activity appears in the mid pelvis superior to the bladder at approximately 52 minutes. There is some to and fro movement with increased intensity at the end of 2 hours. IMPRESSION: Findings suspicious for active GI bleed in the sigmoid colon. Electronically Signed   By: Jeb Levering M.D.   On: 05/13/2017 21:59   Ct Abdomen Pelvis W Contrast  Result Date: 05/12/2017 CLINICAL DATA:  Rectal bleeding x2 today. EXAM: CT ABDOMEN AND PELVIS WITH CONTRAST TECHNIQUE: Multidetector CT imaging of the abdomen and pelvis was performed using the standard protocol following bolus administration of intravenous contrast. CONTRAST:  135mL ISOVUE-300 IOPAMIDOL (ISOVUE-300) INJECTION 61% COMPARISON:  07/21/2015, 09/04/2010 CT FINDINGS: Lower chest: Cardiomegaly with coronary arteriosclerosis. No pericardial effusion. Small hiatal hernia. Subpleural atelectasis and/or scarring.8 mm noncalcified nodule in the right lower lobe, dating back to 2000 12 compatible with a benign finding. Bronchiectasis noted in both lower lobes. Hepatobiliary: Status post cholecystectomy. Reservoir effect from prior cholecystectomy accounting for mild intrahepatic and extrahepatic ductal dilatation without stones. Stable 10 and 7 mm hypodensities in the left hepatic lobe are unchanged in appearance from 2017 comparison. These are consistent with benign findings. Pancreas: No focal mass, ductal dilatation or inflammation. Spleen: Normal size spleen without mass. Adrenals/Urinary Tract: No adrenal mass. Stable appearance of the  right kidney without acute abnormality. Exophytic hyperdense 12 mm left renal mass is unchanged off the interpolar aspect dating back to 2012 comparison. Nonobstructing 3 mm left lower pole renal calculus. No hydroureteronephrosis. The urinary bladder is unremarkable for  degree of distention. Stomach/Bowel: Small hiatal hernia. Decompressed stomach with normal small bowel rotation and no obstruction. Status post appendectomy by report. No acute large bowel obstruction. Circular muscle hypertrophy and extensive sigmoid diverticulosis is noted without acute diverticulitis. Vascular/Lymphatic: Moderate aortoiliac atherosclerosis without aneurysm or adenopathy. Reproductive: Status post hysterectomy. No adnexal masses. Other: No free air nor free fluid. Musculoskeletal: No worrisome lytic or sclerotic osseous lesions. Inferior endplate compression of L1 is chronic. Diffuse degenerative disc disease of the lumbar spine. IMPRESSION: 1. Circular muscle hypertrophy and extensive sigmoid diverticulosis without acute diverticulitis. The diverticulosis may be contributing to the patient's rectal bleeding. 2. Stable interpolar left renal enhancing lesion though stable since 2012 suggesting a benign finding. 3. Nonobstructing left lower pole renal calculus measuring approximately 3 mm. 4. Stable subcentimeter left hepatic hypodensities statistically consistent with cysts or hemangiomata. 5. Aortoiliac atherosclerosis without aneurysm. Electronically Signed   By: Ashley Royalty M.D.   On: 05/12/2017 18:53   Ir Angiogram Visceral Selective  Result Date: 05/14/2017 INDICATION: 84 year old with GI bleeding. Positive nuclear medicine study. Nuclear medicine study suggested that the source of bleeding was in the sigmoid colon. EXAM: MESENTERIC ARTERIOGRAPHY: SELECTIVE ANGIOGRAPHY OF THE SUPERIOR MESENTERIC ARTERY AND SELECTIVE ANGIOGRAPHY OF THE INFERIOR MESENTERIC ARTERY ULTRASOUND GUIDANCE FOR VASCULAR ACCESS MEDICATIONS: None ANESTHESIA/SEDATION: Moderate (conscious) sedation was employed during this procedure. A total of Versed 0.5 mg and Fentanyl 25 mcg was administered intravenously. Moderate Sedation Time: 42 minutes. The patient's level of consciousness and vital signs were monitored  continuously by radiology nursing throughout the procedure under my direct supervision. CONTRAST:  121 mL Isovue-300 FLUOROSCOPY TIME:  Fluoroscopy Time: 7 minutes 36 seconds (553 mGy). COMPLICATIONS: None immediate. PROCEDURE: Informed consent was obtained from the patient's POA following explanation of the procedure, risks, benefits and alternatives. The patient and patient's POA understands, agrees and consents for the procedure. All questions were addressed. A time out was performed prior to the initiation of the procedure. Maximal barrier sterile technique utilized including caps, mask, sterile gowns, sterile gloves, large sterile drape, hand hygiene, and Betadine prep. Ultrasound confirmed a patent right common femoral artery. Palpable pulses in the right posterior tibial artery and right dorsalis pedis artery prior to the procedure. The right groin was prepped and draped in a sterile fashion. Maximal barrier sterile technique was utilized including caps, mask, sterile gowns, sterile gloves, sterile drape, hand hygiene and skin antiseptic. Right groin was anesthetized with 1% lidocaine. Using ultrasound guidance, 21 gauge needle directed into the right common femoral artery. Micropuncture dilator set and 5 French vascular sheath were placed. The inferior mesenteric artery was successfully cannulated with a Sos catheter. Multiple angiograms performed in the inferior mesenteric artery. Oblique images were obtained at the area of concern in the pelvis and sigmoid colon. The catheter was then used to cannulate the superior mesenteric artery. Superior mesenteric arteriogram was performed. The 5 French catheter was removed over a wire. Angiogram was performed through the right groin sheath. The right groin sheath was removed using an ExoSeal closure device. Hemostasis in the groin following removal of the catheter. Fluoroscopic images were taken and saved for this procedure. Ultrasound confirmed a patent right  common femoral artery and an image was saved for documentation. FINDINGS: Inferior mesenteric artery. Inferior mesenteric artery  is patent. Inferior mesenteric vein is patent. There is no evidence for contrast extravasation or active bleeding. No evidence for vascular malformation. Redundant sigmoid colon in the pelvis makes it difficult to evaluate this area. However, no acute abnormality identified with the oblique imaging. Superior mesenteric artery: SMA is patent. Incidentally, there is a replaced right hepatic artery coming off the proximal SMA. Portal venous system is patent. No contrast extravasation and no vascular malformation. IMPRESSION: Negative arteriography of the inferior mesenteric artery and superior mesenteric artery. No evidence for active bleeding or vascular malformation. Electronically Signed   By: Markus Daft M.D.   On: 05/14/2017 08:55   Ir US Guide Vasc Access Right  Result Date: 05/14/2017 INDICATION: 84 year old with GI bleeding. Positive nuclear medicine study. Nuclear medicine study suggested that the source of bleeding was in the sigmoid colon. EXAM: MESENTERIC ARTERIOGRAPHY: SELECTIVE ANGIOGRAPHY OF THE SUPERIOR MESENTERIC ARTERY AND SELECTIVE ANGIOGRAPHY OF THE INFERIOR MESENTERIC ARTERY ULTRASOUND GUIDANCE FOR VASCULAR ACCESS MEDICATIONS: None ANESTHESIA/SEDATION: Moderate (conscious) sedation was employed during this procedure. A total of Versed 0.5 mg and Fentanyl 25 mcg was administered intravenously. Moderate Sedation Time: 42 minutes. The patient's level of consciousness and vital signs were monitored continuously by radiology nursing throughout the procedure under my direct supervision. CONTRAST:  121 mL Isovue-300 FLUOROSCOPY TIME:  Fluoroscopy Time: 7 minutes 36 seconds (553 mGy). COMPLICATIONS: None immediate. PROCEDURE: Informed consent was obtained from the patient's POA following explanation of the procedure, risks, benefits and alternatives. The patient and patient's  POA understands, agrees and consents for the procedure. All questions were addressed. A time out was performed prior to the initiation of the procedure. Maximal barrier sterile technique utilized including caps, mask, sterile gowns, sterile gloves, large sterile drape, hand hygiene, and Betadine prep. Ultrasound confirmed a patent right common femoral artery. Palpable pulses in the right posterior tibial artery and right dorsalis pedis artery prior to the procedure. The right groin was prepped and draped in a sterile fashion. Maximal barrier sterile technique was utilized including caps, mask, sterile gowns, sterile gloves, sterile drape, hand hygiene and skin antiseptic. Right groin was anesthetized with 1% lidocaine. Using ultrasound guidance, 21 gauge needle directed into the right common femoral artery. Micropuncture dilator set and 5 French vascular sheath were placed. The inferior mesenteric artery was successfully cannulated with a Sos catheter. Multiple angiograms performed in the inferior mesenteric artery. Oblique images were obtained at the area of concern in the pelvis and sigmoid colon. The catheter was then used to cannulate the superior mesenteric artery. Superior mesenteric arteriogram was performed. The 5 French catheter was removed over a wire. Angiogram was performed through the right groin sheath. The right groin sheath was removed using an ExoSeal closure device. Hemostasis in the groin following removal of the catheter. Fluoroscopic images were taken and saved for this procedure. Ultrasound confirmed a patent right common femoral artery and an image was saved for documentation. FINDINGS: Inferior mesenteric artery. Inferior mesenteric artery is patent. Inferior mesenteric vein is patent. There is no evidence for contrast extravasation or active bleeding. No evidence for vascular malformation. Redundant sigmoid colon in the pelvis makes it difficult to evaluate this area. However, no acute  abnormality identified with the oblique imaging. Superior mesenteric artery: SMA is patent. Incidentally, there is a replaced right hepatic artery coming off the proximal SMA. Portal venous system is patent. No contrast extravasation and no vascular malformation. IMPRESSION: Negative arteriography of the inferior mesenteric artery and superior mesenteric artery. No evidence for active bleeding  or vascular malformation. Electronically Signed   By: Markus Daft M.D.   On: 05/14/2017 08:55    Time Spent in minutes  30   Lala Lund M.D on 05/14/2017 at 2:42 PM  Between 7am to 7pm - Pager - [phone] ( page via [email or web address], text pages only, please mention full 10 digit call back number). After 7pm go to [email or web address] - password [email or web address]

## 2017-05-15 ENCOUNTER — Encounter (HOSPITAL_COMMUNITY): Payer: Self-pay | Admitting: Diagnostic Radiology

## 2017-05-15 DIAGNOSIS — D649 Anemia, unspecified: Secondary | ICD-10-CM

## 2017-05-15 DIAGNOSIS — K5791 Diverticulosis of intestine, part unspecified, without perforation or abscess with bleeding: Secondary | ICD-10-CM

## 2017-05-15 LAB — CBC
HCT: 25.8 % — ABNORMAL LOW (ref 36.0–46.0)
Hemoglobin: 8.3 g/dL — ABNORMAL LOW (ref 12.0–15.0)
MCH: 30.7 pg (ref 26.0–34.0)
MCHC: 32.2 g/dL (ref 30.0–36.0)
MCV: 95.6 fL (ref 78.0–100.0)
PLATELETS: 205 10*3/uL (ref 150–400)
RBC: 2.7 MIL/uL — ABNORMAL LOW (ref 3.87–5.11)
RDW: 14.1 % (ref 11.5–15.5)
WBC: 10.8 10*3/uL — ABNORMAL HIGH (ref 4.0–10.5)

## 2017-05-15 LAB — BASIC METABOLIC PANEL
Anion gap: 8 (ref 5–15)
BUN: 13 mg/dL (ref 6–20)
CALCIUM: 8.4 mg/dL — AB (ref 8.9–10.3)
CO2: 23 mmol/L (ref 22–32)
Chloride: 106 mmol/L (ref 101–111)
Creatinine, Ser: 0.58 mg/dL (ref 0.44–1.00)
GFR calc Af Amer: >60 mL/min (ref >60)
GLUCOSE: 121 mg/dL — AB (ref 65–99)
POTASSIUM: 4.6 mmol/L (ref 3.5–5.1)
Sodium: 137 mmol/L (ref 135–145)

## 2017-05-15 LAB — HEMOGLOBIN AND HEMATOCRIT, BLOOD
HCT: 26.9 % — ABNORMAL LOW (ref 36.0–46.0)
HEMATOCRIT: 24.6 % — AB (ref 36.0–46.0)
Hemoglobin: 8.6 g/dL — ABNORMAL LOW (ref 12.0–15.0)
Hemoglobin: 7.9 g/dL — ABNORMAL LOW (ref 12.0–15.0)

## 2017-05-15 NOTE — Progress Notes (Signed)
Pt had witnessed assisted fall. RN and NT were attempting to help pt ambulate from bed to chair. Pt legs became very weak and was unable to make it to the chair. RN and NT assisted pt with lowering to the ground to prevent a fall with injury. After pt rested, pt was moved to chair with 2 assist. Pt vitals remain stable, no injuries. MD was notified of occurrence.

## 2017-05-15 NOTE — Plan of Care (Signed)
  Problem: Elimination: Goal: Will not experience complications related to bowel motility Outcome: Progressing Goal: Will not experience complications related to urinary retention Outcome: Progressing   Problem: Pain Managment: Goal: General experience of comfort will improve Outcome: Progressing   Problem: Safety: Goal: Ability to remain free from injury will improve Outcome: Progressing   Problem: Skin Integrity: Goal: Risk for impaired skin integrity will decrease Outcome: Progressing   Problem: Education: Goal: Ability to identify signs and symptoms of gastrointestinal bleeding will improve Outcome: Progressing   Problem: Bowel/Gastric: Goal: Will show no signs and symptoms of gastrointestinal bleeding Outcome: Progressing   Problem: Fluid Volume: Goal: Will show no signs and symptoms of excessive bleeding Outcome: Progressing   Problem: Clinical Measurements: Goal: Complications related to the disease process, condition or treatment will be avoided or minimized Outcome: Progressing

## 2017-05-15 NOTE — Progress Notes (Signed)
@IPLOG @        PROGRESS NOTE                                                                                                                                                                                                             Patient Demographics:    Tracy Sharp, is a 84 y.o. female, DOB - 09-28-1924, DZH:[id number]  Admit date - 05/12/2017   Admitting Physician Ejiroghene Arlyce Dice, MD  Outpatient Primary MD for the patient is [name], DO  LOS - 2  Chief Complaint  Patient presents with  . Rectal Bleeding       Brief Narrative   Tracy Sharp is a 84 y.o. female with medical history significant Schizophrenia, RA, Depression, HTN, HLD, who was brought to ED with c/o rectal bleed that started today. Bright red blood with clots, twice today at the nursing home.  Also with suprapubic pain intermittent over the past several weeks per patient, but worse over the past 2-3 days. she reports chronic intermittent dizziness/vertigo, but none today.  No chest pain.  No fever or chills.  He denies dysuria.  Patient is on chronic 2 L O2 at the nursing home.  Patient is on daily baby aspirin.  ED Course: Stable vitals in the ED systolic blood pressure 419Q to 160, regular pulse 60s.  In ED patient had 2 more bloody bowel movements mostly clots.  Hemoglobin stable at 11.4.  Platelets 215.  Unremarkable lytes and cr.  CT abdomen and pelvis W contrast  revealed extensive diverticulosis, acute diverticulitis. GI was called in the ED by EDP.  Pathologist was called to admit for Lower GI bleed.    Subjective:   Patient in bed, appears comfortable, denies any headache, no fever, no chest pain or pressure, no shortness of breath , no abdominal pain. No focal weakness. No further GI Bleed.   Assessment  & Plan :     Lower GI bleed - this likely is painless acute lower GI diverticular bleed, mild blood loss related anemia but overall hemodynamically stable, continue to monitor H&H now q8 hours,  continue clear liquid diet and bowel rest, GI on board Case discussed with Dr. Lyndel Safe on 05/14/2017 & GI team on 05/15/17 will monitor another day.  She also had a tagged RBC scan on 05/13/2017 showing possible sigmoid area bleeding but later when an angiogram was done by IR for attempted embolization no specific bleeding site could be identified.  Continue supportive care, transfuse if hemoglobin drops below 8, likely  bleeding no further bleed in the next day and we can avoid surgery.   Schizophrenia-appears stable.  Per daughter-in-law no significant memory issues. - cont home risperidone  Rheumatoid arthritis-stable.  Diffuse small joint deformities.  - resume home methotrexate on d/c    Diet : Diet full liquid Room service appropriate? Yes; Fluid consistency: Thin    Family Communication  :  None  Code Status :  Partial  Disposition Plan  : Home once beeding has resolved  Consults  :  GI  Procedures  :    Tagged RBC scan.  Possible bleeding in the sigmoid area.    Attempted embolization by IR with angiogram.  Could not identify any acute bleeding.    CT - IMPRESSION: 1. Circular muscle hypertrophy and extensive sigmoid diverticulosis without acute diverticulitis. The diverticulosis may be contributing to the patient's rectal bleeding. 2. Stable interpolar left renal enhancing lesion though stable since 2012 suggesting a benign finding. 3. Nonobstructing left lower pole renal calculus measuring approximately 3 mm. 4. Stable subcentimeter left hepatic hypodensities statistically consistent with cysts or hemangiomata. 5. Aortoiliac atherosclerosis without aneurysm.  DVT Prophylaxis  :   SCDs   Lab Results  Component Value Date   PLT 205 05/15/2017    Inpatient Medications  Scheduled Meds: . acidophilus  1 capsule Oral Daily  . mirabegron ER  50 mg Oral Daily  . risperiDONE  0.5 mg Oral BID  . sertraline  100 mg Oral Daily   Continuous Infusions: PRN Meds:.ondansetron **OR**  ondansetron (ZOFRAN) IV, traMADol  Antibiotics  :    Anti-infectives (From admission, onward)   None         Objective:   Vitals:   05/14/17 1253 05/14/17 1327 05/14/17 2142 05/15/17 0542  BP: 115/78 120/62 106/61 140/62  Pulse: 99 85 86 80  Resp:  18 18 18   Temp:  97.7 F (36.5 C) 99.8 F (37.7 C) 98 F (36.7 C)  TempSrc:  Oral Oral Oral  SpO2: 100% 100% 100% 100%  Weight:      Height:        Wt Readings from Last 3 Encounters:  05/12/17 75.8 kg (167 lb 1.7 oz)  05/08/17 74.5 kg (164 lb 3.2 oz)  03/30/17 69.4 kg (153 lb)    No intake or output data in the 24 hours ending 05/15/17 0958   Physical Exam  Awake Alert,  No new F.N deficits, Normal affect [place].AT,PERRAL Supple Neck,No JVD, No cervical lymphadenopathy appriciated.  Symmetrical Chest wall movement, Good air movement bilaterally, CTAB RRR,No Gallops, Rubs or new Murmurs, No Parasternal Heave +ve B.Sounds, Abd Soft, No tenderness, No organomegaly appriciated, No rebound - guarding or rigidity. No Cyanosis, Clubbing or edema, No new Rash or bruise        Data Review:    CBC Recent Labs  Lab 05/13/17 0539 05/13/17 1410 05/13/17 2248 05/14/17 0537 05/14/17 1213 05/14/17 1824 05/14/17 2133 05/15/17 0249 05/15/17 0900  WBC 5.5 7.4 6.9 7.2  --   --   --  10.8*  --   HGB 10.0* 10.7* 9.8* 9.0* 8.9* 8.5* 8.4* 8.3* 8.6*  HCT 31.5* 33.5* 30.4* 27.3* 27.8* 26.9* 25.6* 25.8* 26.9*  PLT 197 183 192 179  --   --   --  205  --   MCV 95.5 94.1 94.7 94.5  --   --   --  95.6  --   MCH 30.3 30.1 30.5 31.1  --   --   --  30.7  --  MCHC 31.7 31.9 32.2 33.0  --   --   --  32.2  --   RDW 14.0 13.6 13.7 13.8  --   --   --  14.1  --     Chemistries  Recent Labs  Lab 05/12/17 1611 05/14/17 0537 05/15/17 0249  NA 142 138 137  K 4.0 4.0 4.6  CL 105 107 106  CO2 28 22 23   GLUCOSE 98 114* 121*  BUN 19 15 13   CREATININE 0.64 0.50 0.58  CALCIUM 9.2 8.1* 8.4*  AST 14*  --   --   ALT 10*  --   --   ALKPHOS  52  --   --   BILITOT 0.4  --   --    ------------------------------------------------------------------------------------------------------------------ No results for input(s): CHOL, HDL, LDLCALC, TRIG, CHOLHDL, LDLDIRECT in the last 72 hours.  No results found for: HGBA1C ------------------------------------------------------------------------------------------------------------------ No results for input(s): TSH, T4TOTAL, T3FREE, THYROIDAB in the last 72 hours.  Invalid input(s): FREET3 ------------------------------------------------------------------------------------------------------------------ No results for input(s): VITAMINB12, FOLATE, FERRITIN, TIBC, IRON, RETICCTPCT in the last 72 hours.  Coagulation profile Recent Labs  Lab 05/13/17 1534  INR 1.06    No results for input(s): DDIMER in the last 72 hours.  Cardiac Enzymes No results for input(s): CKMB, TROPONINI, MYOGLOBIN in the last 168 hours.  Invalid input(s): CK ------------------------------------------------------------------------------------------------------------------    Component Value Date/Time   BNP 79.2 09/16/2014 2344    Micro Results Recent Results (from the past 240 hour(s))  MRSA PCR Screening     Status: None   Collection Time: 05/13/17  7:29 AM  Result Value Ref Range Status   MRSA by PCR NEGATIVE NEGATIVE Final    Comment:        The GeneXpert MRSA Assay (FDA approved for NASAL specimens only), is one component of a comprehensive MRSA colonization surveillance program. It is not intended to diagnose MRSA infection nor to guide or monitor treatment for MRSA infections. Performed at [facility], [place] [address], [place], [place] [address]     Radiology Reports Nm Gi Blood Loss  Result Date: 05/13/2017 CLINICAL DATA:  GI bleed. EXAM: NUCLEAR MEDICINE GASTROINTESTINAL BLEEDING SCAN TECHNIQUE: Sequential abdominal images were obtained following intravenous  administration of Tc-66m labeled red blood cells. RADIOPHARMACEUTICALS:  28.6 mCi Tc-43m pertechnetate in-vitro labeled red cells. COMPARISON:  None. FINDINGS: Imaging obtained for 120 minutes. Tracer activity appears in the mid pelvis superior to the bladder at approximately 52 minutes. There is some to and fro movement with increased intensity at the end of 2 hours. IMPRESSION: Findings suspicious for active GI bleed in the sigmoid colon. Electronically Signed   By: Jeb Levering M.D.   On: 05/13/2017 21:59   Ct Abdomen Pelvis W Contrast  Result Date: 05/12/2017 CLINICAL DATA:  Rectal bleeding x2 today. EXAM: CT ABDOMEN AND PELVIS WITH CONTRAST TECHNIQUE: Multidetector CT imaging of the abdomen and pelvis was performed using the standard protocol following bolus administration of intravenous contrast. CONTRAST:  152mL ISOVUE-300 IOPAMIDOL (ISOVUE-300) INJECTION 61% COMPARISON:  07/21/2015, 09/04/2010 CT FINDINGS: Lower chest: Cardiomegaly with coronary arteriosclerosis. No pericardial effusion. Small hiatal hernia. Subpleural atelectasis and/or scarring.8 mm noncalcified nodule in the right lower lobe, dating back to 2000 12 compatible with a benign finding. Bronchiectasis noted in both lower lobes. Hepatobiliary: Status post cholecystectomy. Reservoir effect from prior cholecystectomy accounting for mild intrahepatic and extrahepatic ductal dilatation without stones. Stable 10 and 7 mm hypodensities in the left hepatic lobe are unchanged in appearance from  2017 comparison. These are consistent with benign findings. Pancreas: No focal mass, ductal dilatation or inflammation. Spleen: Normal size spleen without mass. Adrenals/Urinary Tract: No adrenal mass. Stable appearance of the right kidney without acute abnormality. Exophytic hyperdense 12 mm left renal mass is unchanged off the interpolar aspect dating back to 2012 comparison. Nonobstructing 3 mm left lower pole renal calculus. No hydroureteronephrosis.  The urinary bladder is unremarkable for degree of distention. Stomach/Bowel: Small hiatal hernia. Decompressed stomach with normal small bowel rotation and no obstruction. Status post appendectomy by report. No acute large bowel obstruction. Circular muscle hypertrophy and extensive sigmoid diverticulosis is noted without acute diverticulitis. Vascular/Lymphatic: Moderate aortoiliac atherosclerosis without aneurysm or adenopathy. Reproductive: Status post hysterectomy. No adnexal masses. Other: No free air nor free fluid. Musculoskeletal: No worrisome lytic or sclerotic osseous lesions. Inferior endplate compression of L1 is chronic. Diffuse degenerative disc disease of the lumbar spine. IMPRESSION: 1. Circular muscle hypertrophy and extensive sigmoid diverticulosis without acute diverticulitis. The diverticulosis may be contributing to the patient's rectal bleeding. 2. Stable interpolar left renal enhancing lesion though stable since 2012 suggesting a benign finding. 3. Nonobstructing left lower pole renal calculus measuring approximately 3 mm. 4. Stable subcentimeter left hepatic hypodensities statistically consistent with cysts or hemangiomata. 5. Aortoiliac atherosclerosis without aneurysm. Electronically Signed   By: Ashley Royalty M.D.   On: 05/12/2017 18:53   Ir Angiogram Visceral Selective  Result Date: 05/15/2017 INDICATION: 84 year old with GI bleeding. Positive nuclear medicine study. Nuclear medicine study suggested that the source of bleeding was in the sigmoid colon. EXAM: MESENTERIC ARTERIOGRAPHY: SELECTIVE ANGIOGRAPHY OF THE SUPERIOR MESENTERIC ARTERY AND SELECTIVE ANGIOGRAPHY OF THE INFERIOR MESENTERIC ARTERY ULTRASOUND GUIDANCE FOR VASCULAR ACCESS MEDICATIONS: None ANESTHESIA/SEDATION: Moderate (conscious) sedation was employed during this procedure. A total of Versed 0.5 mg and Fentanyl 25 mcg was administered intravenously. Moderate Sedation Time: 42 minutes. The patient's level of consciousness  and vital signs were monitored continuously by radiology nursing throughout the procedure under my direct supervision. CONTRAST:  121 mL Isovue-300 FLUOROSCOPY TIME:  Fluoroscopy Time: 7 minutes 36 seconds (553 mGy). COMPLICATIONS: None immediate. PROCEDURE: Informed consent was obtained from the patient's POA following explanation of the procedure, risks, benefits and alternatives. The patient and patient's POA understands, agrees and consents for the procedure. All questions were addressed. A time out was performed prior to the initiation of the procedure. Maximal barrier sterile technique utilized including caps, mask, sterile gowns, sterile gloves, large sterile drape, hand hygiene, and Betadine prep. Ultrasound confirmed a patent right common femoral artery. Palpable pulses in the right posterior tibial artery and right dorsalis pedis artery prior to the procedure. The right groin was prepped and draped in a sterile fashion. Maximal barrier sterile technique was utilized including caps, mask, sterile gowns, sterile gloves, sterile drape, hand hygiene and skin antiseptic. Right groin was anesthetized with 1% lidocaine. Using ultrasound guidance, 21 gauge needle directed into the right common femoral artery. Micropuncture dilator set and 5 French vascular sheath were placed. The inferior mesenteric artery was successfully cannulated with a Sos catheter. Multiple angiograms performed in the inferior mesenteric artery. Oblique images were obtained at the area of concern in the pelvis and sigmoid colon. The catheter was then used to cannulate the superior mesenteric artery. Superior mesenteric arteriogram was performed. The 5 French catheter was removed over a wire. Angiogram was performed through the right groin sheath. The right groin sheath was removed using an ExoSeal closure device. Hemostasis in the groin following removal of the catheter. Fluoroscopic  images were taken and saved for this procedure. Ultrasound  confirmed a patent right common femoral artery and an image was saved for documentation. FINDINGS: Inferior mesenteric artery. Inferior mesenteric artery is patent. Inferior mesenteric vein is patent. There is no evidence for contrast extravasation or active bleeding. No evidence for vascular malformation. Redundant sigmoid colon in the pelvis makes it difficult to evaluate this area. However, no acute abnormality identified with the oblique imaging. Superior mesenteric artery: SMA is patent. Incidentally, there is a replaced right hepatic artery coming off the proximal SMA. Portal venous system is patent. No contrast extravasation and no vascular malformation. IMPRESSION: Negative arteriography of the inferior mesenteric artery and superior mesenteric artery. No evidence for active bleeding or vascular malformation. Electronically Signed   By: Markus Daft M.D.   On: 05/14/2017 08:55   Ir Angiogram Visceral Selective  Result Date: 05/14/2017 INDICATION: 84 year old with GI bleeding. Positive nuclear medicine study. Nuclear medicine study suggested that the source of bleeding was in the sigmoid colon. EXAM: MESENTERIC ARTERIOGRAPHY: SELECTIVE ANGIOGRAPHY OF THE SUPERIOR MESENTERIC ARTERY AND SELECTIVE ANGIOGRAPHY OF THE INFERIOR MESENTERIC ARTERY ULTRASOUND GUIDANCE FOR VASCULAR ACCESS MEDICATIONS: None ANESTHESIA/SEDATION: Moderate (conscious) sedation was employed during this procedure. A total of Versed 0.5 mg and Fentanyl 25 mcg was administered intravenously. Moderate Sedation Time: 42 minutes. The patient's level of consciousness and vital signs were monitored continuously by radiology nursing throughout the procedure under my direct supervision. CONTRAST:  121 mL Isovue-300 FLUOROSCOPY TIME:  Fluoroscopy Time: 7 minutes 36 seconds (553 mGy). COMPLICATIONS: None immediate. PROCEDURE: Informed consent was obtained from the patient's POA following explanation of the procedure, risks, benefits and alternatives.  The patient and patient's POA understands, agrees and consents for the procedure. All questions were addressed. A time out was performed prior to the initiation of the procedure. Maximal barrier sterile technique utilized including caps, mask, sterile gowns, sterile gloves, large sterile drape, hand hygiene, and Betadine prep. Ultrasound confirmed a patent right common femoral artery. Palpable pulses in the right posterior tibial artery and right dorsalis pedis artery prior to the procedure. The right groin was prepped and draped in a sterile fashion. Maximal barrier sterile technique was utilized including caps, mask, sterile gowns, sterile gloves, sterile drape, hand hygiene and skin antiseptic. Right groin was anesthetized with 1% lidocaine. Using ultrasound guidance, 21 gauge needle directed into the right common femoral artery. Micropuncture dilator set and 5 French vascular sheath were placed. The inferior mesenteric artery was successfully cannulated with a Sos catheter. Multiple angiograms performed in the inferior mesenteric artery. Oblique images were obtained at the area of concern in the pelvis and sigmoid colon. The catheter was then used to cannulate the superior mesenteric artery. Superior mesenteric arteriogram was performed. The 5 French catheter was removed over a wire. Angiogram was performed through the right groin sheath. The right groin sheath was removed using an ExoSeal closure device. Hemostasis in the groin following removal of the catheter. Fluoroscopic images were taken and saved for this procedure. Ultrasound confirmed a patent right common femoral artery and an image was saved for documentation. FINDINGS: Inferior mesenteric artery. Inferior mesenteric artery is patent. Inferior mesenteric vein is patent. There is no evidence for contrast extravasation or active bleeding. No evidence for vascular malformation. Redundant sigmoid colon in the pelvis makes it difficult to evaluate this  area. However, no acute abnormality identified with the oblique imaging. Superior mesenteric artery: SMA is patent. Incidentally, there is a replaced right hepatic artery coming off the proximal SMA.  Portal venous system is patent. No contrast extravasation and no vascular malformation. IMPRESSION: Negative arteriography of the inferior mesenteric artery and superior mesenteric artery. No evidence for active bleeding or vascular malformation. Electronically Signed   By: Markus Daft M.D.   On: 05/14/2017 08:55   Ir US Guide Vasc Access Right  Result Date: 05/14/2017 INDICATION: 84 year old with GI bleeding. Positive nuclear medicine study. Nuclear medicine study suggested that the source of bleeding was in the sigmoid colon. EXAM: MESENTERIC ARTERIOGRAPHY: SELECTIVE ANGIOGRAPHY OF THE SUPERIOR MESENTERIC ARTERY AND SELECTIVE ANGIOGRAPHY OF THE INFERIOR MESENTERIC ARTERY ULTRASOUND GUIDANCE FOR VASCULAR ACCESS MEDICATIONS: None ANESTHESIA/SEDATION: Moderate (conscious) sedation was employed during this procedure. A total of Versed 0.5 mg and Fentanyl 25 mcg was administered intravenously. Moderate Sedation Time: 42 minutes. The patient's level of consciousness and vital signs were monitored continuously by radiology nursing throughout the procedure under my direct supervision. CONTRAST:  121 mL Isovue-300 FLUOROSCOPY TIME:  Fluoroscopy Time: 7 minutes 36 seconds (553 mGy). COMPLICATIONS: None immediate. PROCEDURE: Informed consent was obtained from the patient's POA following explanation of the procedure, risks, benefits and alternatives. The patient and patient's POA understands, agrees and consents for the procedure. All questions were addressed. A time out was performed prior to the initiation of the procedure. Maximal barrier sterile technique utilized including caps, mask, sterile gowns, sterile gloves, large sterile drape, hand hygiene, and Betadine prep. Ultrasound confirmed a patent right common femoral  artery. Palpable pulses in the right posterior tibial artery and right dorsalis pedis artery prior to the procedure. The right groin was prepped and draped in a sterile fashion. Maximal barrier sterile technique was utilized including caps, mask, sterile gowns, sterile gloves, sterile drape, hand hygiene and skin antiseptic. Right groin was anesthetized with 1% lidocaine. Using ultrasound guidance, 21 gauge needle directed into the right common femoral artery. Micropuncture dilator set and 5 French vascular sheath were placed. The inferior mesenteric artery was successfully cannulated with a Sos catheter. Multiple angiograms performed in the inferior mesenteric artery. Oblique images were obtained at the area of concern in the pelvis and sigmoid colon. The catheter was then used to cannulate the superior mesenteric artery. Superior mesenteric arteriogram was performed. The 5 French catheter was removed over a wire. Angiogram was performed through the right groin sheath. The right groin sheath was removed using an ExoSeal closure device. Hemostasis in the groin following removal of the catheter. Fluoroscopic images were taken and saved for this procedure. Ultrasound confirmed a patent right common femoral artery and an image was saved for documentation. FINDINGS: Inferior mesenteric artery. Inferior mesenteric artery is patent. Inferior mesenteric vein is patent. There is no evidence for contrast extravasation or active bleeding. No evidence for vascular malformation. Redundant sigmoid colon in the pelvis makes it difficult to evaluate this area. However, no acute abnormality identified with the oblique imaging. Superior mesenteric artery: SMA is patent. Incidentally, there is a replaced right hepatic artery coming off the proximal SMA. Portal venous system is patent. No contrast extravasation and no vascular malformation. IMPRESSION: Negative arteriography of the inferior mesenteric artery and superior mesenteric  artery. No evidence for active bleeding or vascular malformation. Electronically Signed   By: Markus Daft M.D.   On: 05/14/2017 08:55    Time Spent in minutes  30   Lala Lund M.D on 05/15/2017 at 9:58 AM  Between 7am to 7pm - Pager - [phone] ( page via [email or web address], text pages only, please mention full 10 digit call back number). After 7pm go  to [email or web address] - password [email or web address]

## 2017-05-15 NOTE — Progress Notes (Signed)
Progress Note   Subjective  Chief Complaint: Painless hematochezia  Reports no further bloody bowel movements, did have 3 stools yesterday with no bleeding reported, tolerating soft diet.  No further complaints today.   Objective   Vital signs in last 24 hours: Temp:  [97.7 F (36.5 C)-99.8 F (37.7 C)] 98 F (36.7 C) (04/15 0542) Pulse Rate:  [80-99] 80 (04/15 0542) Resp:  [18] 18 (04/15 0542) BP: (106-140)/(61-78) 140/62 (04/15 0542) SpO2:  [100 %] 100 % (04/15 0542) Last BM Date: 05/14/17 General:    Elderly white female in NAD, VERY HOH Heart:  Regular rate and rhythm; no murmurs Lungs: Respirations even and unlabored, lungs CTA bilaterally Abdomen:  Soft, nontender and nondistended. Normal bowel sounds. Extremities:  Without edema. Neurologic:  Alert and oriented,  grossly normal neurologically. Psych:  Cooperative. Normal mood and affect.  Intake/Output from previous day: 04/14 0701 - 04/15 0700 In: 240 [P.O.:240] Out: -  Intake/Output this shift: Total I/O In: 240 [P.O.:240] Out: -   Lab Results: Recent Labs    05/13/17 2248 05/14/17 0537  05/14/17 2133 05/15/17 0249 05/15/17 0900  WBC 6.9 7.2  --   --  10.8*  --   HGB 9.8* 9.0*   < > 8.4* 8.3* 8.6*  HCT 30.4* 27.3*   < > 25.6* 25.8* 26.9*  PLT 192 179  --   --  205  --    < > = values in this interval not displayed.   BMET Recent Labs    05/12/17 1611 05/14/17 0537 05/15/17 0249  NA 142 138 137  K 4.0 4.0 4.6  CL 105 107 106  CO2 28 22 23   GLUCOSE 98 114* 121*  BUN 19 15 13   CREATININE 0.64 0.50 0.58  CALCIUM 9.2 8.1* 8.4*   LFT Recent Labs    05/12/17 1611  PROT 6.3*  ALBUMIN 3.0*  AST 14*  ALT 10*  ALKPHOS 52  BILITOT 0.4   PT/INR Recent Labs    05/13/17 1534  LABPROT 13.7  INR 1.06    Studies/Results: Nm Gi Blood Loss  Result Date: 05/13/2017 CLINICAL DATA:  GI bleed. EXAM: NUCLEAR MEDICINE GASTROINTESTINAL BLEEDING SCAN TECHNIQUE: Sequential abdominal images were  obtained following intravenous administration of Tc-71m labeled red blood cells. RADIOPHARMACEUTICALS:  28.6 mCi Tc-62m pertechnetate in-vitro labeled red cells. COMPARISON:  None. FINDINGS: Imaging obtained for 120 minutes. Tracer activity appears in the mid pelvis superior to the bladder at approximately 52 minutes. There is some to and fro movement with increased intensity at the end of 2 hours. IMPRESSION: Findings suspicious for active GI bleed in the sigmoid colon. Electronically Signed   By: Jeb Levering M.D.   On: 05/13/2017 21:59   Ir Angiogram Visceral Selective  Result Date: 05/15/2017 INDICATION: 84 year old with GI bleeding. Positive nuclear medicine study. Nuclear medicine study suggested that the source of bleeding was in the sigmoid colon. EXAM: MESENTERIC ARTERIOGRAPHY: SELECTIVE ANGIOGRAPHY OF THE SUPERIOR MESENTERIC ARTERY AND SELECTIVE ANGIOGRAPHY OF THE INFERIOR MESENTERIC ARTERY ULTRASOUND GUIDANCE FOR VASCULAR ACCESS MEDICATIONS: None ANESTHESIA/SEDATION: Moderate (conscious) sedation was employed during this procedure. A total of Versed 0.5 mg and Fentanyl 25 mcg was administered intravenously. Moderate Sedation Time: 42 minutes. The patient's level of consciousness and vital signs were monitored continuously by radiology nursing throughout the procedure under my direct supervision. CONTRAST:  121 mL Isovue-300 FLUOROSCOPY TIME:  Fluoroscopy Time: 7 minutes 36 seconds (553 mGy). COMPLICATIONS: None immediate. PROCEDURE: Informed consent was obtained from the patient's  POA following explanation of the procedure, risks, benefits and alternatives. The patient and patient's POA understands, agrees and consents for the procedure. All questions were addressed. A time out was performed prior to the initiation of the procedure. Maximal barrier sterile technique utilized including caps, mask, sterile gowns, sterile gloves, large sterile drape, hand hygiene, and Betadine prep. Ultrasound  confirmed a patent right common femoral artery. Palpable pulses in the right posterior tibial artery and right dorsalis pedis artery prior to the procedure. The right groin was prepped and draped in a sterile fashion. Maximal barrier sterile technique was utilized including caps, mask, sterile gowns, sterile gloves, sterile drape, hand hygiene and skin antiseptic. Right groin was anesthetized with 1% lidocaine. Using ultrasound guidance, 21 gauge needle directed into the right common femoral artery. Micropuncture dilator set and 5 French vascular sheath were placed. The inferior mesenteric artery was successfully cannulated with a Sos catheter. Multiple angiograms performed in the inferior mesenteric artery. Oblique images were obtained at the area of concern in the pelvis and sigmoid colon. The catheter was then used to cannulate the superior mesenteric artery. Superior mesenteric arteriogram was performed. The 5 French catheter was removed over a wire. Angiogram was performed through the right groin sheath. The right groin sheath was removed using an ExoSeal closure device. Hemostasis in the groin following removal of the catheter. Fluoroscopic images were taken and saved for this procedure. Ultrasound confirmed a patent right common femoral artery and an image was saved for documentation. FINDINGS: Inferior mesenteric artery. Inferior mesenteric artery is patent. Inferior mesenteric vein is patent. There is no evidence for contrast extravasation or active bleeding. No evidence for vascular malformation. Redundant sigmoid colon in the pelvis makes it difficult to evaluate this area. However, no acute abnormality identified with the oblique imaging. Superior mesenteric artery: SMA is patent. Incidentally, there is a replaced right hepatic artery coming off the proximal SMA. Portal venous system is patent. No contrast extravasation and no vascular malformation. IMPRESSION: Negative arteriography of the inferior  mesenteric artery and superior mesenteric artery. No evidence for active bleeding or vascular malformation. Electronically Signed   By: Markus Daft M.D.   On: 05/14/2017 08:55   Ir Angiogram Visceral Selective  Result Date: 05/14/2017 INDICATION: 84 year old with GI bleeding. Positive nuclear medicine study. Nuclear medicine study suggested that the source of bleeding was in the sigmoid colon. EXAM: MESENTERIC ARTERIOGRAPHY: SELECTIVE ANGIOGRAPHY OF THE SUPERIOR MESENTERIC ARTERY AND SELECTIVE ANGIOGRAPHY OF THE INFERIOR MESENTERIC ARTERY ULTRASOUND GUIDANCE FOR VASCULAR ACCESS MEDICATIONS: None ANESTHESIA/SEDATION: Moderate (conscious) sedation was employed during this procedure. A total of Versed 0.5 mg and Fentanyl 25 mcg was administered intravenously. Moderate Sedation Time: 42 minutes. The patient's level of consciousness and vital signs were monitored continuously by radiology nursing throughout the procedure under my direct supervision. CONTRAST:  121 mL Isovue-300 FLUOROSCOPY TIME:  Fluoroscopy Time: 7 minutes 36 seconds (553 mGy). COMPLICATIONS: None immediate. PROCEDURE: Informed consent was obtained from the patient's POA following explanation of the procedure, risks, benefits and alternatives. The patient and patient's POA understands, agrees and consents for the procedure. All questions were addressed. A time out was performed prior to the initiation of the procedure. Maximal barrier sterile technique utilized including caps, mask, sterile gowns, sterile gloves, large sterile drape, hand hygiene, and Betadine prep. Ultrasound confirmed a patent right common femoral artery. Palpable pulses in the right posterior tibial artery and right dorsalis pedis artery prior to the procedure. The right groin was prepped and draped in a sterile fashion.  Maximal barrier sterile technique was utilized including caps, mask, sterile gowns, sterile gloves, sterile drape, hand hygiene and skin antiseptic. Right groin  was anesthetized with 1% lidocaine. Using ultrasound guidance, 21 gauge needle directed into the right common femoral artery. Micropuncture dilator set and 5 French vascular sheath were placed. The inferior mesenteric artery was successfully cannulated with a Sos catheter. Multiple angiograms performed in the inferior mesenteric artery. Oblique images were obtained at the area of concern in the pelvis and sigmoid colon. The catheter was then used to cannulate the superior mesenteric artery. Superior mesenteric arteriogram was performed. The 5 French catheter was removed over a wire. Angiogram was performed through the right groin sheath. The right groin sheath was removed using an ExoSeal closure device. Hemostasis in the groin following removal of the catheter. Fluoroscopic images were taken and saved for this procedure. Ultrasound confirmed a patent right common femoral artery and an image was saved for documentation. FINDINGS: Inferior mesenteric artery. Inferior mesenteric artery is patent. Inferior mesenteric vein is patent. There is no evidence for contrast extravasation or active bleeding. No evidence for vascular malformation. Redundant sigmoid colon in the pelvis makes it difficult to evaluate this area. However, no acute abnormality identified with the oblique imaging. Superior mesenteric artery: SMA is patent. Incidentally, there is a replaced right hepatic artery coming off the proximal SMA. Portal venous system is patent. No contrast extravasation and no vascular malformation. IMPRESSION: Negative arteriography of the inferior mesenteric artery and superior mesenteric artery. No evidence for active bleeding or vascular malformation. Electronically Signed   By: Markus Daft M.D.   On: 05/14/2017 08:55   Ir US Guide Vasc Access Right  Result Date: 05/14/2017 INDICATION: 84 year old with GI bleeding. Positive nuclear medicine study. Nuclear medicine study suggested that the source of bleeding was in the  sigmoid colon. EXAM: MESENTERIC ARTERIOGRAPHY: SELECTIVE ANGIOGRAPHY OF THE SUPERIOR MESENTERIC ARTERY AND SELECTIVE ANGIOGRAPHY OF THE INFERIOR MESENTERIC ARTERY ULTRASOUND GUIDANCE FOR VASCULAR ACCESS MEDICATIONS: None ANESTHESIA/SEDATION: Moderate (conscious) sedation was employed during this procedure. A total of Versed 0.5 mg and Fentanyl 25 mcg was administered intravenously. Moderate Sedation Time: 42 minutes. The patient's level of consciousness and vital signs were monitored continuously by radiology nursing throughout the procedure under my direct supervision. CONTRAST:  121 mL Isovue-300 FLUOROSCOPY TIME:  Fluoroscopy Time: 7 minutes 36 seconds (553 mGy). COMPLICATIONS: None immediate. PROCEDURE: Informed consent was obtained from the patient's POA following explanation of the procedure, risks, benefits and alternatives. The patient and patient's POA understands, agrees and consents for the procedure. All questions were addressed. A time out was performed prior to the initiation of the procedure. Maximal barrier sterile technique utilized including caps, mask, sterile gowns, sterile gloves, large sterile drape, hand hygiene, and Betadine prep. Ultrasound confirmed a patent right common femoral artery. Palpable pulses in the right posterior tibial artery and right dorsalis pedis artery prior to the procedure. The right groin was prepped and draped in a sterile fashion. Maximal barrier sterile technique was utilized including caps, mask, sterile gowns, sterile gloves, sterile drape, hand hygiene and skin antiseptic. Right groin was anesthetized with 1% lidocaine. Using ultrasound guidance, 21 gauge needle directed into the right common femoral artery. Micropuncture dilator set and 5 French vascular sheath were placed. The inferior mesenteric artery was successfully cannulated with a Sos catheter. Multiple angiograms performed in the inferior mesenteric artery. Oblique images were obtained at the area of  concern in the pelvis and sigmoid colon. The catheter was then used to cannulate  the superior mesenteric artery. Superior mesenteric arteriogram was performed. The 5 French catheter was removed over a wire. Angiogram was performed through the right groin sheath. The right groin sheath was removed using an ExoSeal closure device. Hemostasis in the groin following removal of the catheter. Fluoroscopic images were taken and saved for this procedure. Ultrasound confirmed a patent right common femoral artery and an image was saved for documentation. FINDINGS: Inferior mesenteric artery. Inferior mesenteric artery is patent. Inferior mesenteric vein is patent. There is no evidence for contrast extravasation or active bleeding. No evidence for vascular malformation. Redundant sigmoid colon in the pelvis makes it difficult to evaluate this area. However, no acute abnormality identified with the oblique imaging. Superior mesenteric artery: SMA is patent. Incidentally, there is a replaced right hepatic artery coming off the proximal SMA. Portal venous system is patent. No contrast extravasation and no vascular malformation. IMPRESSION: Negative arteriography of the inferior mesenteric artery and superior mesenteric artery. No evidence for active bleeding or vascular malformation. Electronically Signed   By: Markus Daft M.D.   On: 05/14/2017 08:55    Assessment / Plan:   Assessment: 1.  Hematochezia: Admitted with painless bloody stools on 05/12/17, previously documented diverticulosis, thought consistent with acute diverticular hemorrhage, CT on admit also confirmed multiple diverticula, active bleeding 05/13/17, nuc med bleeding scan positive in sigmoid colon, and Angiography 05/14/17 negative, no further active bleeding overnight 2.  Anemia: Hemoglobin stable at 8.6  Plan: 1.  Continue to monitor hemoglobin with transfusion less than 8.5 2.  Continue current diet 3.  Continue to observe patient for another 24 hours,  if no further signs of bleeding she can be discharged tomorrow, if further bleeding would recommend a flex sigmoidoscopy/colonoscopy for further evaluation 4.  Please await any further recommendations from Dr. Havery Moros later today  Thank you for your kind consultation, we will continue to follow along.    LOS: 2 days   Levin Erp  05/15/2017, 10:12 AM  Pager # [phone]

## 2017-05-15 NOTE — Evaluation (Signed)
Physical Therapy Evaluation Patient Details Name: Tracy Sharp MRN: [id number] DOB: 23-Aug-1924 Today's Date: 05/15/2017   History of Present Illness  84 yo female admitted from SNF with bleeding. Diagnosed with LGIB. S/P mesenteric arteriogram, 4/14. Hx of RA, OA, CAD, peripheral neuropathy, bil TKA, UTI.     Clinical Impression  Pt requesting assistance to get back into bed. She c/o feeling "woozy...weak". On eval, pt required Mod assist for mobility. She was able to stand and perform a stand pivot from recliner to bed. Assess vitals: BP 141/67, HR 88 bpm, O2 100% on [place] O2. Pt is at risk for falls when mobilizing. Per chart, pt uses wheelchair to get around at [facility]. She is a long term SNF resident. Will follow on a limited basis. Recommend return to SNF. SNF therapy staff can determine if they wish for pt to resume PT at the facility.     Follow Up Recommendations Return to SNF    Equipment Recommendations  None recommended by PT    Recommendations for Other Services       Precautions / Restrictions Precautions Precautions: Fall Precaution Comments: O2 dep Restrictions Weight Bearing Restrictions: No      Mobility  Bed Mobility Overal bed mobility: Needs Assistance Bed Mobility: Sit to Supine       Sit to supine: Mod assist;HOB elevated   General bed mobility comments: Assist for bil LEs ont bed. Increased time. Pt used bedrail.   Transfers Overall transfer level: Needs assistance Equipment used: Rolling walker (2 wheeled) Transfers: Sit to/from [facility] Sit to Stand: Mod assist Stand pivot transfers: Mod assist       General transfer comment: Assist to block feet from shooting out, rise, stabilize, control descent. Increased time. Cues for safey, technique. Stand pivot, recliner to bed, with a RW.   Ambulation/Gait             General Gait Details: NT-pt is nonambulatory at baseline per chart  Stairs            Wheelchair  Mobility    Modified Rankin (Stroke Patients Only)       Balance Overall balance assessment: Needs assistance         Standing balance support: Bilateral upper extremity supported Standing balance-Leahy Scale: Poor                               Pertinent Vitals/Pain Pain Assessment: No/denies pain    Home Living Family/patient expects to be discharged to:: Skilled nursing facility                      Prior Function Level of Independence: Needs assistance   Gait / Transfers Assistance Needed: non ambulatory. Uses wheelchair to get around. Per chart, pt was discharged from PT at [facility]. Pt is a LTC SNF resident.   ADL's / Homemaking Assistance Needed: Assist needed for bathing, dressing        Hand Dominance        Extremity/Trunk Assessment   Upper Extremity Assessment Upper Extremity Assessment: Generalized weakness    Lower Extremity Assessment Lower Extremity Assessment: Generalized weakness    Cervical / Trunk Assessment Cervical / Trunk Assessment: Kyphotic  Communication   Communication: HOH  Cognition Arousal/Alertness: Awake/alert Behavior During Therapy: WFL for tasks assessed/performed Overall Cognitive Status: Within Functional Limits for tasks assessed  General Comments      Exercises     Assessment/Plan    PT Assessment Patient needs continued PT services  PT Problem List Decreased strength;Decreased balance;Decreased mobility;Decreased activity tolerance;Decreased safety awareness       PT Treatment Interventions Therapeutic activities;Therapeutic exercise;Patient/family education;DME instruction;Functional mobility training;Balance training    PT Goals (Current goals can be found in the Care Plan section)  Acute Rehab PT Goals Patient Stated Goal: none stated PT Goal Formulation: With patient Time For Goal Achievement: 05/29/17 Potential to Achieve Goals:  Poor    Frequency Min 2X/week   Barriers to discharge        Co-evaluation               AM-PAC PT "6 Clicks" Daily Activity  Outcome Measure Difficulty turning over in bed (including adjusting bedclothes, sheets and blankets)?: Unable Difficulty moving from lying on back to sitting on the side of the bed? : Unable Difficulty sitting down on and standing up from a chair with arms (e.g., wheelchair, bedside commode, etc,.)?: Unable Help needed moving to and from a bed to chair (including a wheelchair)?: Total Help needed walking in hospital room?: Total Help needed climbing 3-5 steps with a railing? : Total 6 Click Score: 6    End of Session Equipment Utilized During Treatment: Gait belt Activity Tolerance: Patient tolerated treatment well Patient left: in bed;with bed alarm set;with call bell/phone within reach   PT Visit Diagnosis: Muscle weakness (generalized) (M62.81);Difficulty in walking, not elsewhere classified (R26.2);Other abnormalities of gait and mobility (R26.89);Unsteadiness on feet (R26.81);History of falling (Z91.81)    Time: 9563-8756 PT Time Calculation (min) (ACUTE ONLY): 12 min   Charges:   PT Evaluation $PT Eval Moderate Complexity: 1 Mod     PT G Codes:         Weston Anna, MPT Pager: [phone]

## 2017-05-16 ENCOUNTER — Inpatient Hospital Stay (HOSPITAL_COMMUNITY): Payer: Medicare Other

## 2017-05-16 DIAGNOSIS — K625 Hemorrhage of anus and rectum: Secondary | ICD-10-CM

## 2017-05-16 LAB — BASIC METABOLIC PANEL
Anion gap: 6 (ref 5–15)
BUN: 9 mg/dL (ref 6–20)
CO2: 27 mmol/L (ref 22–32)
CREATININE: 0.56 mg/dL (ref 0.44–1.00)
Calcium: 8.4 mg/dL — ABNORMAL LOW (ref 8.9–10.3)
Chloride: 105 mmol/L (ref 101–111)
Glucose, Bld: 113 mg/dL — ABNORMAL HIGH (ref 65–99)
Potassium: 4.4 mmol/L (ref 3.5–5.1)
Sodium: 138 mmol/L (ref 135–145)

## 2017-05-16 LAB — HEMOGLOBIN AND HEMATOCRIT, BLOOD
HCT: 25.8 % — ABNORMAL LOW (ref 36.0–46.0)
HEMATOCRIT: 31.7 % — AB (ref 36.0–46.0)
HEMATOCRIT: 23.9 % — AB (ref 36.0–46.0)
HEMOGLOBIN: 8.4 g/dL — AB (ref 12.0–15.0)
HEMOGLOBIN: 7.7 g/dL — AB (ref 12.0–15.0)
Hemoglobin: 9.9 g/dL — ABNORMAL LOW (ref 12.0–15.0)

## 2017-05-16 LAB — PREPARE RBC (CROSSMATCH)

## 2017-05-16 MED ORDER — FUROSEMIDE 10 MG/ML IJ SOLN
20.0000 mg | Freq: Once | INTRAMUSCULAR | Status: AC
  Administered 2017-05-16: 20 mg via INTRAVENOUS
  Filled 2017-05-16: qty 2

## 2017-05-16 MED ORDER — DIPHENHYDRAMINE HCL 50 MG/ML IJ SOLN: 25.0000 mg | Freq: Four times a day (QID) | INTRAMUSCULAR | Status: DC | PRN

## 2017-05-16 MED ORDER — SODIUM CHLORIDE 0.9 % IV SOLN
Freq: Once | INTRAVENOUS | Status: AC
  Administered 2017-05-16: 12:00:00 via INTRAVENOUS

## 2017-05-16 NOTE — Progress Notes (Signed)
     [facility] Progress Note   Chief Complaint:   Rectal bleeding   SUBJECTIVE:    feels weak and wants something more to eat. Currently no SOB. Getting blood transfusion   ASSESSMENT AND PLAN:   84 yo female admitted with painless rectal bleeding. Positive bleeding scan (sigmoid) but angiogram negative for active bleeding. Known diverticulosis, bleeding presumed to be diverticular hemorrhage. Bleeding has resolved -getting a unit of blood now as hgb dwindled to 7.7.  -can advance to solid diet from Gi standpoint.  -Hopefully home soon    OBJECTIVE:      Vital signs in last 24 hours: Temp:  [97.5 F (36.4 C)-98.9 F (37.2 C)] 97.7 F (36.5 C) (04/16 1146) Pulse Rate:  [79-91] 81 (04/16 1146) Resp:  [18-26] 20 (04/16 1146) BP: (99-128)/(49-60) 115/52 (04/16 1146) SpO2:  [100 %] 100 % (04/16 1146) Last BM Date: 05/16/17 General:   Alert, well-developed, white female in NAD EENT:  Normal hearing, non icteric sclera, conjunctive pink.  Heart:  Regular rate and rhythm; + murmur, no lower extremity edema Pulm: Normal respiratory effort Abdomen:  Soft, nondistended, nontender.  Normal bowel sounds, no masses felt. No hepatomegaly.    Neurologic:  Alert and  oriented x4;  grossly normal neurologically. Psych:  Pleasant, cooperative.  Normal mood and affect.   Intake/Output from previous day: 04/15 0701 - 04/16 0700 In: 480 [P.O.:480] Out: -  Intake/Output this shift: No intake/output data recorded.  Lab Results: Recent Labs    05/13/17 2248 05/14/17 0537  05/15/17 0249 05/15/17 0900 05/15/17 1659 05/16/17 0046  WBC 6.9 7.2  --  10.8*  --   --   --   HGB 9.8* 9.0*   < > 8.3* 8.6* 7.9* 7.7*  HCT 30.4* 27.3*   < > 25.8* 26.9* 24.6* 23.9*  PLT 192 179  --  205  --   --   --    < > = values in this interval not displayed.   BMET Recent Labs    05/14/17 0537 05/15/17 0249 05/16/17 0046  NA 138 137 138  K 4.0 4.6 4.4  CL 107 106 105  CO2 22  23 27   GLUCOSE 114* 121* 113*  BUN 15 13 9   CREATININE 0.50 0.58 0.56  CALCIUM 8.1* 8.4* 8.4*   LFT No results for input(s): PROT, ALBUMIN, AST, ALT, ALKPHOS, BILITOT, BILIDIR, IBILI in the last 72 hours. PT/INR Recent Labs    05/13/17 1534  LABPROT 13.7  INR 1.06    Discharge Planning Diet:as tolerated Discharge Medications: no changes from Gi standpoint Follow up: prn  Active Problems:   Depression with anxiety   Essential hypertension   Coronary atherosclerosis   Rheumatoid arthritis ([place])   Schizophrenia ([place])   Lower GI bleed   LGI bleed   Gastrointestinal hemorrhage associated with intestinal diverticulosis     LOS: 3 days   Tye Savoy ,NP 05/16/2017, 12:16 PM  Pager number [phone]

## 2017-05-16 NOTE — Progress Notes (Addendum)
@IPLOG @        PROGRESS NOTE                                                                                                                                                                                                             Patient Demographics:    Tracy Sharp, is a 84 y.o. female, DOB - Sep 28, 1924, YTK:[id number]  Admit date - 05/12/2017   Admitting Physician Ejiroghene Arlyce Dice, MD  Outpatient Primary MD for the patient is [name], DO  LOS - 3  Chief Complaint  Patient presents with  . Rectal Bleeding       Brief Narrative   Tracy Sharp is a 84 y.o. female with medical history significant Schizophrenia, RA, Depression, HTN, HLD, who was brought to ED with c/o rectal bleed that started today. Bright red blood with clots, twice today at the nursing home.  Also with suprapubic pain intermittent over the past several weeks per patient, but worse over the past 2-3 days. she reports chronic intermittent dizziness/vertigo, but none today.  No chest pain.  No fever or chills.  He denies dysuria.  Patient is on chronic 2 L O2 at the nursing home.  Patient is on daily baby aspirin.  ED Course: Stable vitals in the ED systolic blood pressure 557D to 160, regular pulse 60s.  In ED patient had 2 more bloody bowel movements mostly clots.  Hemoglobin stable at 11.4.  Platelets 215.  Unremarkable lytes and cr.  CT abdomen and pelvis W contrast  revealed extensive diverticulosis, acute diverticulitis. GI was called in the ED by EDP.  Pathologist was called to admit for Lower GI bleed.    Subjective:   Patient in bed, appears comfortable, denies any headache, no fever, no chest pain or pressure, no shortness of breath , no abdominal pain. No focal weakness.    Assessment  & Plan :     Lower GI bleed - this likely is painless acute lower GI diverticular bleed, mild blood loss related anemia but overall hemodynamically stable, continue to monitor H&H now q8 hours, continue clear  liquid diet and bowel rest, since hemoglobin has trended below 8 and she is above 89 years old and now feeling generalized weakness will transfuse 1 unit of packed RBC on 05/16/2017, said that there is no fresh bleed in the last 24 hours, GI on board Case discussed with Dr. Lyndel Safe on 05/14/2017 & GI team on 05/15/17 .  She also had a tagged RBC scan on 05/13/2017 showing possible  sigmoid area bleeding but later when an angiogram was done by IR for attempted embolization no specific bleeding site could be identified.  I do not think she is bleeding anymore, monitor another 24 hours if H&H stable discharge home.    Schizophrenia-appears stable.  Per daughter-in-law no significant memory issues. - cont home risperidone  Rheumatoid arthritis-stable.  Diffuse small joint deformities.  - resume home methotrexate on d/c   Addendum - in the afternoon developed small 2cm induration in the R.anticubital area, likely small IV infiltration ( had IV there) - soft tissue US and warm compress.    Diet : Diet full liquid Room service appropriate? Yes; Fluid consistency: Thin    Family Communication  :  None  Code Status :  Partial  Disposition Plan  : Home once beeding has resolved  Consults  :  GI  Procedures  :    Tagged RBC scan.  Possible bleeding in the sigmoid area.    Attempted embolization by IR with angiogram.  Could not identify any acute bleeding.    CT - IMPRESSION: 1. Circular muscle hypertrophy and extensive sigmoid diverticulosis without acute diverticulitis. The diverticulosis may be contributing to the patient's rectal bleeding. 2. Stable interpolar left renal enhancing lesion though stable since 2012 suggesting a benign finding. 3. Nonobstructing left lower pole renal calculus measuring approximately 3 mm. 4. Stable subcentimeter left hepatic hypodensities statistically consistent with cysts or hemangiomata. 5. Aortoiliac atherosclerosis without aneurysm.  DVT Prophylaxis  :   SCDs    Lab Results  Component Value Date   PLT 205 05/15/2017    Inpatient Medications  Scheduled Meds: . acidophilus  1 capsule Oral Daily  . furosemide  20 mg Intravenous Once  . mirabegron ER  50 mg Oral Daily  . risperiDONE  0.5 mg Oral BID  . sertraline  100 mg Oral Daily   Continuous Infusions: . sodium chloride     PRN Meds:.diphenhydrAMINE, ondansetron **OR** ondansetron (ZOFRAN) IV, traMADol  Antibiotics  :    Anti-infectives (From admission, onward)   None         Objective:   Vitals:   05/15/17 1217 05/15/17 2003 05/16/17 0420 05/16/17 1114  BP: (!) 125/54 99/60 128/60 (!) 111/49  Pulse: 79 91 79 81  Resp: (!) 26 18 20 20   Temp: 97.6 F (36.4 C) 98.1 F (36.7 C) 98.9 F (37.2 C) (!) 97.5 F (36.4 C)  TempSrc: Oral Oral Oral Oral  SpO2: 100% 100% 100% 100%  Weight:      Height:        Wt Readings from Last 3 Encounters:  05/12/17 75.8 kg (167 lb 1.7 oz)  05/08/17 74.5 kg (164 lb 3.2 oz)  03/30/17 69.4 kg (153 lb)     Intake/Output Summary (Last 24 hours) at 05/16/2017 1132 Last data filed at 05/15/2017 2026 Gross per 24 hour  Intake 240 ml  Output -  Net 240 ml     Physical Exam  Awake Alert,  No new F.N deficits, Normal affect [place].AT,PERRAL Supple Neck,No JVD, No cervical lymphadenopathy appriciated.  Symmetrical Chest wall movement, Good air movement bilaterally, CTAB RRR,No Gallops, Rubs or new Murmurs, No Parasternal Heave +ve B.Sounds, Abd Soft, No tenderness, No organomegaly appriciated, No rebound - guarding or rigidity. No Cyanosis, Clubbing or edema, No new Rash or bruise        Data Review:    CBC Recent Labs  Lab 05/13/17 0539 05/13/17 1410 05/13/17 2248 05/14/17 0537  05/14/17 2133 05/15/17  0240 05/15/17 0900 05/15/17 1659 05/16/17 0046  WBC 5.5 7.4 6.9 7.2  --   --  10.8*  --   --   --   HGB 10.0* 10.7* 9.8* 9.0*   < > 8.4* 8.3* 8.6* 7.9* 7.7*  HCT 31.5* 33.5* 30.4* 27.3*   < > 25.6* 25.8* 26.9* 24.6* 23.9*   PLT 197 183 192 179  --   --  205  --   --   --   MCV 95.5 94.1 94.7 94.5  --   --  95.6  --   --   --   MCH 30.3 30.1 30.5 31.1  --   --  30.7  --   --   --   MCHC 31.7 31.9 32.2 33.0  --   --  32.2  --   --   --   RDW 14.0 13.6 13.7 13.8  --   --  14.1  --   --   --    < > = values in this interval not displayed.    Chemistries  Recent Labs  Lab 05/12/17 1611 05/14/17 0537 05/15/17 0249 05/16/17 0046  NA 142 138 137 138  K 4.0 4.0 4.6 4.4  CL 105 107 106 105  CO2 28 22 23 27   GLUCOSE 98 114* 121* 113*  BUN 19 15 13 9   CREATININE 0.64 0.50 0.58 0.56  CALCIUM 9.2 8.1* 8.4* 8.4*  AST 14*  --   --   --   ALT 10*  --   --   --   ALKPHOS 52  --   --   --   BILITOT 0.4  --   --   --    ------------------------------------------------------------------------------------------------------------------ No results for input(s): CHOL, HDL, LDLCALC, TRIG, CHOLHDL, LDLDIRECT in the last 72 hours.  No results found for: HGBA1C ------------------------------------------------------------------------------------------------------------------ No results for input(s): TSH, T4TOTAL, T3FREE, THYROIDAB in the last 72 hours.  Invalid input(s): FREET3 ------------------------------------------------------------------------------------------------------------------ No results for input(s): VITAMINB12, FOLATE, FERRITIN, TIBC, IRON, RETICCTPCT in the last 72 hours.  Coagulation profile Recent Labs  Lab 05/13/17 1534  INR 1.06    No results for input(s): DDIMER in the last 72 hours.  Cardiac Enzymes No results for input(s): CKMB, TROPONINI, MYOGLOBIN in the last 168 hours.  Invalid input(s): CK ------------------------------------------------------------------------------------------------------------------    Component Value Date/Time   BNP 79.2 09/16/2014 2344    Micro Results Recent Results (from the past 240 hour(s))  MRSA PCR Screening     Status: None   Collection Time:  05/13/17  7:29 AM  Result Value Ref Range Status   MRSA by PCR NEGATIVE NEGATIVE Final    Comment:        The GeneXpert MRSA Assay (FDA approved for NASAL specimens only), is one component of a comprehensive MRSA colonization surveillance program. It is not intended to diagnose MRSA infection nor to guide or monitor treatment for MRSA infections. Performed at [facility], [place] [address] [place], [place] [address]     Radiology Reports Nm Gi Blood Loss  Result Date: 05/13/2017 CLINICAL DATA:  GI bleed. EXAM: NUCLEAR MEDICINE GASTROINTESTINAL BLEEDING SCAN TECHNIQUE: Sequential abdominal images were obtained following intravenous administration of Tc-7m labeled red blood cells. RADIOPHARMACEUTICALS:  28.6 mCi Tc-6m pertechnetate in-vitro labeled red cells. COMPARISON:  None. FINDINGS: Imaging obtained for 120 minutes. Tracer activity appears in the mid pelvis superior to the bladder at approximately 52 minutes. There is some to and fro movement with increased intensity at  the end of 2 hours. IMPRESSION: Findings suspicious for active GI bleed in the sigmoid colon. Electronically Signed   By: Jeb Levering M.D.   On: 05/13/2017 21:59   Ct Abdomen Pelvis W Contrast  Result Date: 05/12/2017 CLINICAL DATA:  Rectal bleeding x2 today. EXAM: CT ABDOMEN AND PELVIS WITH CONTRAST TECHNIQUE: Multidetector CT imaging of the abdomen and pelvis was performed using the standard protocol following bolus administration of intravenous contrast. CONTRAST:  12mL ISOVUE-300 IOPAMIDOL (ISOVUE-300) INJECTION 61% COMPARISON:  07/21/2015, 09/04/2010 CT FINDINGS: Lower chest: Cardiomegaly with coronary arteriosclerosis. No pericardial effusion. Small hiatal hernia. Subpleural atelectasis and/or scarring.8 mm noncalcified nodule in the right lower lobe, dating back to 2000 12 compatible with a benign finding. Bronchiectasis noted in both lower lobes. Hepatobiliary: Status post cholecystectomy.  Reservoir effect from prior cholecystectomy accounting for mild intrahepatic and extrahepatic ductal dilatation without stones. Stable 10 and 7 mm hypodensities in the left hepatic lobe are unchanged in appearance from 2017 comparison. These are consistent with benign findings. Pancreas: No focal mass, ductal dilatation or inflammation. Spleen: Normal size spleen without mass. Adrenals/Urinary Tract: No adrenal mass. Stable appearance of the right kidney without acute abnormality. Exophytic hyperdense 12 mm left renal mass is unchanged off the interpolar aspect dating back to 2012 comparison. Nonobstructing 3 mm left lower pole renal calculus. No hydroureteronephrosis. The urinary bladder is unremarkable for degree of distention. Stomach/Bowel: Small hiatal hernia. Decompressed stomach with normal small bowel rotation and no obstruction. Status post appendectomy by report. No acute large bowel obstruction. Circular muscle hypertrophy and extensive sigmoid diverticulosis is noted without acute diverticulitis. Vascular/Lymphatic: Moderate aortoiliac atherosclerosis without aneurysm or adenopathy. Reproductive: Status post hysterectomy. No adnexal masses. Other: No free air nor free fluid. Musculoskeletal: No worrisome lytic or sclerotic osseous lesions. Inferior endplate compression of L1 is chronic. Diffuse degenerative disc disease of the lumbar spine. IMPRESSION: 1. Circular muscle hypertrophy and extensive sigmoid diverticulosis without acute diverticulitis. The diverticulosis may be contributing to the patient's rectal bleeding. 2. Stable interpolar left renal enhancing lesion though stable since 2012 suggesting a benign finding. 3. Nonobstructing left lower pole renal calculus measuring approximately 3 mm. 4. Stable subcentimeter left hepatic hypodensities statistically consistent with cysts or hemangiomata. 5. Aortoiliac atherosclerosis without aneurysm. Electronically Signed   By: Ashley Royalty M.D.   On:  05/12/2017 18:53   Ir Angiogram Visceral Selective  Result Date: 05/15/2017 INDICATION: 84 year old with GI bleeding. Positive nuclear medicine study. Nuclear medicine study suggested that the source of bleeding was in the sigmoid colon. EXAM: MESENTERIC ARTERIOGRAPHY: SELECTIVE ANGIOGRAPHY OF THE SUPERIOR MESENTERIC ARTERY AND SELECTIVE ANGIOGRAPHY OF THE INFERIOR MESENTERIC ARTERY ULTRASOUND GUIDANCE FOR VASCULAR ACCESS MEDICATIONS: None ANESTHESIA/SEDATION: Moderate (conscious) sedation was employed during this procedure. A total of Versed 0.5 mg and Fentanyl 25 mcg was administered intravenously. Moderate Sedation Time: 42 minutes. The patient's level of consciousness and vital signs were monitored continuously by radiology nursing throughout the procedure under my direct supervision. CONTRAST:  121 mL Isovue-300 FLUOROSCOPY TIME:  Fluoroscopy Time: 7 minutes 36 seconds (553 mGy). COMPLICATIONS: None immediate. PROCEDURE: Informed consent was obtained from the patient's POA following explanation of the procedure, risks, benefits and alternatives. The patient and patient's POA understands, agrees and consents for the procedure. All questions were addressed. A time out was performed prior to the initiation of the procedure. Maximal barrier sterile technique utilized including caps, mask, sterile gowns, sterile gloves, large sterile drape, hand hygiene, and Betadine prep. Ultrasound confirmed a patent right common femoral artery. Palpable  pulses in the right posterior tibial artery and right dorsalis pedis artery prior to the procedure. The right groin was prepped and draped in a sterile fashion. Maximal barrier sterile technique was utilized including caps, mask, sterile gowns, sterile gloves, sterile drape, hand hygiene and skin antiseptic. Right groin was anesthetized with 1% lidocaine. Using ultrasound guidance, 21 gauge needle directed into the right common femoral artery. Micropuncture dilator set and 5  French vascular sheath were placed. The inferior mesenteric artery was successfully cannulated with a Sos catheter. Multiple angiograms performed in the inferior mesenteric artery. Oblique images were obtained at the area of concern in the pelvis and sigmoid colon. The catheter was then used to cannulate the superior mesenteric artery. Superior mesenteric arteriogram was performed. The 5 French catheter was removed over a wire. Angiogram was performed through the right groin sheath. The right groin sheath was removed using an ExoSeal closure device. Hemostasis in the groin following removal of the catheter. Fluoroscopic images were taken and saved for this procedure. Ultrasound confirmed a patent right common femoral artery and an image was saved for documentation. FINDINGS: Inferior mesenteric artery. Inferior mesenteric artery is patent. Inferior mesenteric vein is patent. There is no evidence for contrast extravasation or active bleeding. No evidence for vascular malformation. Redundant sigmoid colon in the pelvis makes it difficult to evaluate this area. However, no acute abnormality identified with the oblique imaging. Superior mesenteric artery: SMA is patent. Incidentally, there is a replaced right hepatic artery coming off the proximal SMA. Portal venous system is patent. No contrast extravasation and no vascular malformation. IMPRESSION: Negative arteriography of the inferior mesenteric artery and superior mesenteric artery. No evidence for active bleeding or vascular malformation. Electronically Signed   By: Markus Daft M.D.   On: 05/14/2017 08:55   Ir Angiogram Visceral Selective  Result Date: 05/14/2017 INDICATION: 84 year old with GI bleeding. Positive nuclear medicine study. Nuclear medicine study suggested that the source of bleeding was in the sigmoid colon. EXAM: MESENTERIC ARTERIOGRAPHY: SELECTIVE ANGIOGRAPHY OF THE SUPERIOR MESENTERIC ARTERY AND SELECTIVE ANGIOGRAPHY OF THE INFERIOR MESENTERIC  ARTERY ULTRASOUND GUIDANCE FOR VASCULAR ACCESS MEDICATIONS: None ANESTHESIA/SEDATION: Moderate (conscious) sedation was employed during this procedure. A total of Versed 0.5 mg and Fentanyl 25 mcg was administered intravenously. Moderate Sedation Time: 42 minutes. The patient's level of consciousness and vital signs were monitored continuously by radiology nursing throughout the procedure under my direct supervision. CONTRAST:  121 mL Isovue-300 FLUOROSCOPY TIME:  Fluoroscopy Time: 7 minutes 36 seconds (553 mGy). COMPLICATIONS: None immediate. PROCEDURE: Informed consent was obtained from the patient's POA following explanation of the procedure, risks, benefits and alternatives. The patient and patient's POA understands, agrees and consents for the procedure. All questions were addressed. A time out was performed prior to the initiation of the procedure. Maximal barrier sterile technique utilized including caps, mask, sterile gowns, sterile gloves, large sterile drape, hand hygiene, and Betadine prep. Ultrasound confirmed a patent right common femoral artery. Palpable pulses in the right posterior tibial artery and right dorsalis pedis artery prior to the procedure. The right groin was prepped and draped in a sterile fashion. Maximal barrier sterile technique was utilized including caps, mask, sterile gowns, sterile gloves, sterile drape, hand hygiene and skin antiseptic. Right groin was anesthetized with 1% lidocaine. Using ultrasound guidance, 21 gauge needle directed into the right common femoral artery. Micropuncture dilator set and 5 French vascular sheath were placed. The inferior mesenteric artery was successfully cannulated with a Sos catheter. Multiple angiograms performed in the inferior  mesenteric artery. Oblique images were obtained at the area of concern in the pelvis and sigmoid colon. The catheter was then used to cannulate the superior mesenteric artery. Superior mesenteric arteriogram was performed.  The 5 French catheter was removed over a wire. Angiogram was performed through the right groin sheath. The right groin sheath was removed using an ExoSeal closure device. Hemostasis in the groin following removal of the catheter. Fluoroscopic images were taken and saved for this procedure. Ultrasound confirmed a patent right common femoral artery and an image was saved for documentation. FINDINGS: Inferior mesenteric artery. Inferior mesenteric artery is patent. Inferior mesenteric vein is patent. There is no evidence for contrast extravasation or active bleeding. No evidence for vascular malformation. Redundant sigmoid colon in the pelvis makes it difficult to evaluate this area. However, no acute abnormality identified with the oblique imaging. Superior mesenteric artery: SMA is patent. Incidentally, there is a replaced right hepatic artery coming off the proximal SMA. Portal venous system is patent. No contrast extravasation and no vascular malformation. IMPRESSION: Negative arteriography of the inferior mesenteric artery and superior mesenteric artery. No evidence for active bleeding or vascular malformation. Electronically Signed   By: Markus Daft M.D.   On: 05/14/2017 08:55   Ir US Guide Vasc Access Right  Result Date: 05/14/2017 INDICATION: 84 year old with GI bleeding. Positive nuclear medicine study. Nuclear medicine study suggested that the source of bleeding was in the sigmoid colon. EXAM: MESENTERIC ARTERIOGRAPHY: SELECTIVE ANGIOGRAPHY OF THE SUPERIOR MESENTERIC ARTERY AND SELECTIVE ANGIOGRAPHY OF THE INFERIOR MESENTERIC ARTERY ULTRASOUND GUIDANCE FOR VASCULAR ACCESS MEDICATIONS: None ANESTHESIA/SEDATION: Moderate (conscious) sedation was employed during this procedure. A total of Versed 0.5 mg and Fentanyl 25 mcg was administered intravenously. Moderate Sedation Time: 42 minutes. The patient's level of consciousness and vital signs were monitored continuously by radiology nursing throughout the  procedure under my direct supervision. CONTRAST:  121 mL Isovue-300 FLUOROSCOPY TIME:  Fluoroscopy Time: 7 minutes 36 seconds (553 mGy). COMPLICATIONS: None immediate. PROCEDURE: Informed consent was obtained from the patient's POA following explanation of the procedure, risks, benefits and alternatives. The patient and patient's POA understands, agrees and consents for the procedure. All questions were addressed. A time out was performed prior to the initiation of the procedure. Maximal barrier sterile technique utilized including caps, mask, sterile gowns, sterile gloves, large sterile drape, hand hygiene, and Betadine prep. Ultrasound confirmed a patent right common femoral artery. Palpable pulses in the right posterior tibial artery and right dorsalis pedis artery prior to the procedure. The right groin was prepped and draped in a sterile fashion. Maximal barrier sterile technique was utilized including caps, mask, sterile gowns, sterile gloves, sterile drape, hand hygiene and skin antiseptic. Right groin was anesthetized with 1% lidocaine. Using ultrasound guidance, 21 gauge needle directed into the right common femoral artery. Micropuncture dilator set and 5 French vascular sheath were placed. The inferior mesenteric artery was successfully cannulated with a Sos catheter. Multiple angiograms performed in the inferior mesenteric artery. Oblique images were obtained at the area of concern in the pelvis and sigmoid colon. The catheter was then used to cannulate the superior mesenteric artery. Superior mesenteric arteriogram was performed. The 5 French catheter was removed over a wire. Angiogram was performed through the right groin sheath. The right groin sheath was removed using an ExoSeal closure device. Hemostasis in the groin following removal of the catheter. Fluoroscopic images were taken and saved for this procedure. Ultrasound confirmed a patent right common femoral artery and an image was saved  for  documentation. FINDINGS: Inferior mesenteric artery. Inferior mesenteric artery is patent. Inferior mesenteric vein is patent. There is no evidence for contrast extravasation or active bleeding. No evidence for vascular malformation. Redundant sigmoid colon in the pelvis makes it difficult to evaluate this area. However, no acute abnormality identified with the oblique imaging. Superior mesenteric artery: SMA is patent. Incidentally, there is a replaced right hepatic artery coming off the proximal SMA. Portal venous system is patent. No contrast extravasation and no vascular malformation. IMPRESSION: Negative arteriography of the inferior mesenteric artery and superior mesenteric artery. No evidence for active bleeding or vascular malformation. Electronically Signed   By: Markus Daft M.D.   On: 05/14/2017 08:55    Time Spent in minutes  30   Lala Sharp M.D on 05/16/2017 at 11:32 AM  Between 7am to 7pm - Pager - [phone] ( page via [email or web address], text pages only, please mention full 10 digit call back number). After 7pm go to [email or web address] - password [email or web address]

## 2017-05-16 NOTE — Care Management Important Message (Signed)
Important Message  Patient Details  Name: Tracy Sharp MRN: [id number] Date of Birth: [birth year]   Medicare Important Message Given:  Yes    Kerin Salen 05/16/2017, 10:34 AMImportant Message  Patient Details  Name: Tracy Sharp MRN: [id number] Date of Birth: 1924-04-02   Medicare Important Message Given:  Yes    Kerin Salen 05/16/2017, 10:34 AM

## 2017-05-17 DIAGNOSIS — M069 Rheumatoid arthritis, unspecified: Secondary | ICD-10-CM

## 2017-05-17 DIAGNOSIS — I10 Essential (primary) hypertension: Secondary | ICD-10-CM

## 2017-05-17 DIAGNOSIS — F418 Other specified anxiety disorders: Secondary | ICD-10-CM

## 2017-05-17 DIAGNOSIS — F209 Schizophrenia, unspecified: Secondary | ICD-10-CM

## 2017-05-17 DIAGNOSIS — K922 Gastrointestinal hemorrhage, unspecified: Secondary | ICD-10-CM

## 2017-05-17 DIAGNOSIS — I2510 Atherosclerotic heart disease of native coronary artery without angina pectoris: Secondary | ICD-10-CM

## 2017-05-17 LAB — TYPE AND SCREEN
ABO/RH(D): A POS
ANTIBODY SCREEN: NEGATIVE
Unit division: 0

## 2017-05-17 LAB — BPAM RBC
Blood Product Expiration Date: 201905122359
ISSUE DATE / TIME: 201904161109
Unit Type and Rh: 6200

## 2017-05-17 LAB — HEMOGLOBIN AND HEMATOCRIT, BLOOD
HCT: 26.6 % — ABNORMAL LOW (ref 36.0–46.0)
HEMOGLOBIN: 8.6 g/dL — AB (ref 12.0–15.0)

## 2017-05-17 LAB — BASIC METABOLIC PANEL
Anion gap: 10 (ref 5–15)
BUN: 21 mg/dL — AB (ref 6–20)
CHLORIDE: 103 mmol/L (ref 101–111)
CO2: 27 mmol/L (ref 22–32)
CREATININE: 0.56 mg/dL (ref 0.44–1.00)
Calcium: 8.5 mg/dL — ABNORMAL LOW (ref 8.9–10.3)
GFR calc Af Amer: >60 mL/min (ref >60)
GFR calc non Af Amer: >60 mL/min (ref >60)
Glucose, Bld: 106 mg/dL — ABNORMAL HIGH (ref 65–99)
Potassium: 4.1 mmol/L (ref 3.5–5.1)
SODIUM: 140 mmol/L (ref 135–145)

## 2017-05-17 MED ORDER — ASPIRIN EC 81 MG PO TBEC: 81.0000 mg | DELAYED_RELEASE_TABLET | Freq: Every day | ORAL | Status: DC

## 2017-05-17 MED ORDER — TRAMADOL HCL 50 MG PO TABS: ORAL_TABLET | ORAL | 0 refills | Status: AC

## 2017-05-17 NOTE — NC FL2 (Signed)
[place] LEVEL OF CARE SCREENING TOOL     IDENTIFICATION  Patient Name: Tracy Sharp Birthdate: 06/22/1924 Sex: female Admission Date (Current Location): 05/12/2017  [facility] and [place] Number:  Herbalist and Address:  [facility],  [address] [place], [place]      Provider Number: [id number]  Attending Physician Name and Address:  Mariel Aloe, MD  Relative Name and Phone Number:       Current Level of Care: Hospital Recommended Level of Care: [place] Prior Approval Number:    Date Approved/Denied:   PASRR Number: [id number]  Discharge Plan: SNF    Current Diagnoses: Patient Active Problem List   Diagnosis Date Noted  . Gastrointestinal hemorrhage associated with intestinal diverticulosis   . LGI bleed 05/13/2017  . Lower GI bleed 05/12/2017  . Cellulitis of forehead 12/25/2016  . Shingles outbreak 12/25/2016  . Urinary, incontinence, stress female 12/18/2016  . Adjustment disorder with mixed anxiety and depressed mood 10/31/2016  . Chronic diarrhea 09/25/2016  . Schizophrenia ([place]) 08/27/2016  . Hypoxia 07/28/2016  . Moderate aortic stenosis 07/27/2016  . Anemia 07/24/2016  . Spinal stenosis of lumbar region with neurogenic claudication 12/14/2015  . Malnutrition of moderate degree 07/22/2015  . Compression fracture of L1 lumbar vertebra ([place]) 07/21/2015  . Rheumatoid arthritis ([place]) 07/21/2015  . Peripheral neuropathy 07/21/2015  . Bilateral lower extremity edema 09/24/2014  . GERD (gastroesophageal reflux disease) 09/17/2014  . Chronic leg pain 09/16/2014  . Compression fracture   . Varicose veins 07/11/2012  . Osteoporosis 12/09/2009  . GAIT DISTURBANCE 01/09/2007  . Coronary atherosclerosis 01/08/2007  . HLD (hyperlipidemia) 11/03/2006  . Depression with anxiety 11/03/2006  . Essential hypertension 11/03/2006  . Osteoarthritis 11/03/2006    Orientation RESPIRATION BLADDER Height &  Weight     Self, Time, Situation, Place  O2 Continent Weight: 167 lb 1.7 oz (75.8 kg) Height:  5\' 2"  (157.5 cm)  BEHAVIORAL SYMPTOMS/MOOD NEUROLOGICAL BOWEL NUTRITION STATUS      Continent Diet(see dc summary)  AMBULATORY STATUS COMMUNICATION OF NEEDS Skin   Total Care Verbally Other (Comment)(Wound/Incision(OpenorDehisced)ElbowAnterior;Rightpustulewitherythema)                       Personal Care Assistance Level of Assistance  Bathing, Feeding, Dressing Bathing Assistance: Maximum assistance Feeding assistance: Independent Dressing Assistance: Maximum assistance     Functional Limitations Info  Hearing   Hearing Info: Impaired      SPECIAL CARE FACTORS FREQUENCY  PT (By licensed PT)     PT Frequency: Min 2x/week              Contractures Contractures Info: Not present    Additional Factors Info  Code Status, Allergies Code Status Info: Partial  Allergies Info: Doxycycline;Hydrocodone;Norco Hydrocodone-acetaminophen;Toviaz Fesoterodine Fumarate Er;Plaquenil Hydroxychloroquine           Current Medications (05/17/2017):  This is the current hospital active medication list Current Facility-Administered Medications  Medication Dose Route Frequency Provider Last Rate Last Dose  . acidophilus (RISAQUAD) capsule 1 capsule  1 capsule Oral Daily Emokpae, Ejiroghene E, MD   1 capsule at 05/17/17 0940  . diphenhydrAMINE (BENADRYL) injection 25 mg  25 mg Intravenous Q6H PRN Thurnell Lose, MD      . mirabegron ER (MYRBETRIQ) tablet 50 mg  50 mg Oral Daily Emokpae, Ejiroghene E, MD   50 mg at 05/17/17 0939  . ondansetron (ZOFRAN) tablet 4 mg  4 mg Oral  Q6H PRN Emokpae, Ejiroghene E, MD       Or  . ondansetron (ZOFRAN) injection 4 mg  4 mg Intravenous Q6H PRN Emokpae, Ejiroghene E, MD      . risperiDONE (RISPERDAL) tablet 0.5 mg  0.5 mg Oral BID Emokpae, Ejiroghene E, MD   0.5 mg at 05/17/17 0940  . sertraline (ZOLOFT) tablet 100 mg  100 mg Oral Daily  Emokpae, Ejiroghene E, MD   100 mg at 05/17/17 0939  . traMADol (ULTRAM) tablet 50 mg  50 mg Oral Q6H PRN Emokpae, Ejiroghene E, MD   50 mg at 05/15/17 1119     Discharge Medications: Please see discharge summary for a list of discharge medications.  Relevant Imaging Results:  Relevant Lab Results:   Additional Information SS# [id number]  Burnis Medin, LCSW

## 2017-05-17 NOTE — Progress Notes (Signed)
Report called to [place], LPN at [facility].  Danton Clap, RN

## 2017-05-17 NOTE — Discharge Summary (Signed)
Physician Discharge Summary  Tracy Sharp XBM:[id number] DOB: [birth year] DOA: 05/12/2017  PCP: [name], DO  Admit date: 05/12/2017 Discharge date: 05/17/2017  Admitted From: SNF Disposition: SNF  Recommendations for Outpatient Follow-up:  1. Follow up with PCP in 1 week 2. Please obtain CBC in 2-3 days 3. Please follow up on the following pending results: None  Home Health: SNF Equipment/Devices: None  Discharge Condition: Stable CODE STATUS: Partial code (DNI, no defibrillation) Diet recommendation: Regular diet   Brief/Interim Summary:  Admission HPI written by [name], MD   Chief Complaint: Rectal Bleed  HPI: Tracy Sharp is a 84 y.o. female with medical history significant Schizophrenia, RA, Depression, HTN, HLD, who was brought to ED with c/o rectal bleed that started today. Bright red blood with clots, twice today at the nursing home.  Also with suprapubic pain intermittent over the past several weeks per patient, but worse over the past 2-3 days. she reports chronic intermittent dizziness/vertigo, but none today.  No chest pain.  No fever or chills.  He denies dysuria.  Patient is on chronic 2 L O2 at the nursing home.  Patient is on daily baby aspirin.  ED Course: Stable vitals in the ED systolic blood pressure 027O to 160, regular pulse 60s.  In ED patient had 2 more bloody bowel movements mostly clots.  Hemoglobin stable at 11.4.  Platelets 215.  Unremarkable lytes and cr.  CT abdomen and pelvis W contrast  revealed extensive diverticulosis, acute diverticulitis. GI was called in the ED by EDP.  Pathologist was called to admit for Lower GI bleed.    Hospital course:  Lower GI bleeding Patient with a history of diverticulosis.  Acute bleeding likely secondary to acute diverticular bleed.  Gastroenterology evaluated and recommended conservative management.  Patient received 1 unit of PRBC. Hemoglobin responded from 7.7-9.9, down to 8.4 and  remained stable at 8.6 prior to discharge.  Schizophrenia Stable.  With continued outpatient risperidone  Rheumatoid arthritis Stable.  Continue methotrexate at discharge.  Right superficial thrombophlebitis Supportive care.   Discharge Diagnoses:  Active Problems:   Depression with anxiety   Essential hypertension   Coronary atherosclerosis   Rheumatoid arthritis (HCC)   Schizophrenia (HCC)   Lower GI bleed   LGI bleed   Gastrointestinal hemorrhage associated with intestinal diverticulosis    Discharge Instructions  Discharge Instructions    Call MD for:  difficulty breathing, headache or visual disturbances   Complete by:  As directed    Diet - low sodium heart healthy   Complete by:  As directed    Increase activity slowly   Complete by:  As directed      Allergies as of 05/17/2017      Reactions   Doxycycline Other (See Comments)   intolerance   Hydrocodone Other (See Comments)   intolerance   Norco [hydrocodone-acetaminophen] Other (See Comments)   Nausea, dizziness   Toviaz [fesoterodine Fumarate Er] Other (See Comments)   dizzy   Plaquenil [hydroxychloroquine] Rash      Medication List    TAKE these medications   ACIDOPHILUS PO Take 1 tablet by mouth daily.   aspirin EC 81 MG tablet Take 1 tablet (81 mg total) by mouth daily. Start taking on:  05/26/2017 What changed:  These instructions start on 05/26/2017. If you are unsure what to do until then, ask your doctor or other care provider.   diclofenac sodium 1 % Gel Commonly known as:  VOLTAREN Apply  4 gram transdermally every 6 hours for leg pain   docusate sodium 100 MG capsule Commonly known as:  COLACE Take 100 mg by mouth daily as needed for mild constipation.   folic acid 1 MG tablet Commonly known as:  FOLVITE Take 1 mg by mouth daily.   Melatonin 5 MG Tabs Take 5 mg by mouth at bedtime.   methotrexate 2.5 MG tablet Commonly known as:  RHEUMATREX Take 10 mg by mouth See admin  instructions. Take 10 mg ( 4 tablets ) once a week on wednesdays   multivitamin-lutein Caps capsule Take 1 capsule by mouth daily.   MYRBETRIQ 50 MG Tb24 tablet Generic drug:  mirabegron ER Take 50 mg by mouth daily.   OXYGEN Inhale 2 L/min into the lungs continuous.   PREVALITE 4 GM/DOSE powder Generic drug:  cholestyramine light Take 4 g by mouth daily.   risperiDONE 0.5 MG tablet Commonly known as:  RISPERDAL Take 0.5 mg by mouth 2 (two) times daily.   sertraline 100 MG tablet Commonly known as:  ZOLOFT Take 100 mg by mouth daily.   SYSTANE 0.4-0.3 % Gel ophthalmic gel Generic drug:  Polyethyl Glycol-Propyl Glycol Instil 2 drops in both eyes two times daily for dry eyes   traMADol 50 MG tablet Commonly known as:  ULTRAM Give 0.5 tablet by mouth two times daily for pain management   Vitamin D 2000 units tablet Take 2,000 Units by mouth daily.       Allergies  Allergen Reactions  . Doxycycline Other (See Comments)    intolerance  . Hydrocodone Other (See Comments)    intolerance  . Norco [Hydrocodone-Acetaminophen] Other (See Comments)    Nausea, dizziness  . Toviaz [Fesoterodine Fumarate Er] Other (See Comments)    dizzy  . Plaquenil [Hydroxychloroquine] Rash    Consultations:  Gasteroenterology   Procedures/Studies: Nm Gi Blood Loss  Result Date: 05/13/2017 CLINICAL DATA:  GI bleed. EXAM: NUCLEAR MEDICINE GASTROINTESTINAL BLEEDING SCAN TECHNIQUE: Sequential abdominal images were obtained following intravenous administration of Tc-20m labeled red blood cells. RADIOPHARMACEUTICALS:  28.6 mCi Tc-8m pertechnetate in-vitro labeled red cells. COMPARISON:  None. FINDINGS: Imaging obtained for 120 minutes. Tracer activity appears in the mid pelvis superior to the bladder at approximately 52 minutes. There is some to and fro movement with increased intensity at the end of 2 hours. IMPRESSION: Findings suspicious for active GI bleed in the sigmoid colon.  Electronically Signed   By: Jeb Levering M.D.   On: 05/13/2017 21:59   Ct Abdomen Pelvis W Contrast  Result Date: 05/12/2017 CLINICAL DATA:  Rectal bleeding x2 today. EXAM: CT ABDOMEN AND PELVIS WITH CONTRAST TECHNIQUE: Multidetector CT imaging of the abdomen and pelvis was performed using the standard protocol following bolus administration of intravenous contrast. CONTRAST:  173mL ISOVUE-300 IOPAMIDOL (ISOVUE-300) INJECTION 61% COMPARISON:  07/21/2015, 09/04/2010 CT FINDINGS: Lower chest: Cardiomegaly with coronary arteriosclerosis. No pericardial effusion. Small hiatal hernia. Subpleural atelectasis and/or scarring.8 mm noncalcified nodule in the right lower lobe, dating back to 2000 12 compatible with a benign finding. Bronchiectasis noted in both lower lobes. Hepatobiliary: Status post cholecystectomy. Reservoir effect from prior cholecystectomy accounting for mild intrahepatic and extrahepatic ductal dilatation without stones. Stable 10 and 7 mm hypodensities in the left hepatic lobe are unchanged in appearance from 2017 comparison. These are consistent with benign findings. Pancreas: No focal mass, ductal dilatation or inflammation. Spleen: Normal size spleen without mass. Adrenals/Urinary Tract: No adrenal mass. Stable appearance of the right kidney without acute abnormality. Exophytic  hyperdense 12 mm left renal mass is unchanged off the interpolar aspect dating back to 2012 comparison. Nonobstructing 3 mm left lower pole renal calculus. No hydroureteronephrosis. The urinary bladder is unremarkable for degree of distention. Stomach/Bowel: Small hiatal hernia. Decompressed stomach with normal small bowel rotation and no obstruction. Status post appendectomy by report. No acute large bowel obstruction. Circular muscle hypertrophy and extensive sigmoid diverticulosis is noted without acute diverticulitis. Vascular/Lymphatic: Moderate aortoiliac atherosclerosis without aneurysm or adenopathy.  Reproductive: Status post hysterectomy. No adnexal masses. Other: No free air nor free fluid. Musculoskeletal: No worrisome lytic or sclerotic osseous lesions. Inferior endplate compression of L1 is chronic. Diffuse degenerative disc disease of the lumbar spine. IMPRESSION: 1. Circular muscle hypertrophy and extensive sigmoid diverticulosis without acute diverticulitis. The diverticulosis may be contributing to the patient's rectal bleeding. 2. Stable interpolar left renal enhancing lesion though stable since 2012 suggesting a benign finding. 3. Nonobstructing left lower pole renal calculus measuring approximately 3 mm. 4. Stable subcentimeter left hepatic hypodensities statistically consistent with cysts or hemangiomata. 5. Aortoiliac atherosclerosis without aneurysm. Electronically Signed   By: Ashley Royalty M.D.   On: 05/12/2017 18:53   Ir Angiogram Visceral Selective  Result Date: 05/15/2017 INDICATION: 84 year old with GI bleeding. Positive nuclear medicine study. Nuclear medicine study suggested that the source of bleeding was in the sigmoid colon. EXAM: MESENTERIC ARTERIOGRAPHY: SELECTIVE ANGIOGRAPHY OF THE SUPERIOR MESENTERIC ARTERY AND SELECTIVE ANGIOGRAPHY OF THE INFERIOR MESENTERIC ARTERY ULTRASOUND GUIDANCE FOR VASCULAR ACCESS MEDICATIONS: None ANESTHESIA/SEDATION: Moderate (conscious) sedation was employed during this procedure. A total of Versed 0.5 mg and Fentanyl 25 mcg was administered intravenously. Moderate Sedation Time: 42 minutes. The patient's level of consciousness and vital signs were monitored continuously by radiology nursing throughout the procedure under my direct supervision. CONTRAST:  121 mL Isovue-300 FLUOROSCOPY TIME:  Fluoroscopy Time: 7 minutes 36 seconds (553 mGy). COMPLICATIONS: None immediate. PROCEDURE: Informed consent was obtained from the patient's POA following explanation of the procedure, risks, benefits and alternatives. The patient and patient's POA understands,  agrees and consents for the procedure. All questions were addressed. A time out was performed prior to the initiation of the procedure. Maximal barrier sterile technique utilized including caps, mask, sterile gowns, sterile gloves, large sterile drape, hand hygiene, and Betadine prep. Ultrasound confirmed a patent right common femoral artery. Palpable pulses in the right posterior tibial artery and right dorsalis pedis artery prior to the procedure. The right groin was prepped and draped in a sterile fashion. Maximal barrier sterile technique was utilized including caps, mask, sterile gowns, sterile gloves, sterile drape, hand hygiene and skin antiseptic. Right groin was anesthetized with 1% lidocaine. Using ultrasound guidance, 21 gauge needle directed into the right common femoral artery. Micropuncture dilator set and 5 French vascular sheath were placed. The inferior mesenteric artery was successfully cannulated with a Sos catheter. Multiple angiograms performed in the inferior mesenteric artery. Oblique images were obtained at the area of concern in the pelvis and sigmoid colon. The catheter was then used to cannulate the superior mesenteric artery. Superior mesenteric arteriogram was performed. The 5 French catheter was removed over a wire. Angiogram was performed through the right groin sheath. The right groin sheath was removed using an ExoSeal closure device. Hemostasis in the groin following removal of the catheter. Fluoroscopic images were taken and saved for this procedure. Ultrasound confirmed a patent right common femoral artery and an image was saved for documentation. FINDINGS: Inferior mesenteric artery. Inferior mesenteric artery is patent. Inferior mesenteric vein is patent.  There is no evidence for contrast extravasation or active bleeding. No evidence for vascular malformation. Redundant sigmoid colon in the pelvis makes it difficult to evaluate this area. However, no acute abnormality identified  with the oblique imaging. Superior mesenteric artery: SMA is patent. Incidentally, there is a replaced right hepatic artery coming off the proximal SMA. Portal venous system is patent. No contrast extravasation and no vascular malformation. IMPRESSION: Negative arteriography of the inferior mesenteric artery and superior mesenteric artery. No evidence for active bleeding or vascular malformation. Electronically Signed   By: Markus Daft M.D.   On: 05/14/2017 08:55   Ir Angiogram Visceral Selective  Result Date: 05/14/2017 INDICATION: 84 year old with GI bleeding. Positive nuclear medicine study. Nuclear medicine study suggested that the source of bleeding was in the sigmoid colon. EXAM: MESENTERIC ARTERIOGRAPHY: SELECTIVE ANGIOGRAPHY OF THE SUPERIOR MESENTERIC ARTERY AND SELECTIVE ANGIOGRAPHY OF THE INFERIOR MESENTERIC ARTERY ULTRASOUND GUIDANCE FOR VASCULAR ACCESS MEDICATIONS: None ANESTHESIA/SEDATION: Moderate (conscious) sedation was employed during this procedure. A total of Versed 0.5 mg and Fentanyl 25 mcg was administered intravenously. Moderate Sedation Time: 42 minutes. The patient's level of consciousness and vital signs were monitored continuously by radiology nursing throughout the procedure under my direct supervision. CONTRAST:  121 mL Isovue-300 FLUOROSCOPY TIME:  Fluoroscopy Time: 7 minutes 36 seconds (553 mGy). COMPLICATIONS: None immediate. PROCEDURE: Informed consent was obtained from the patient's POA following explanation of the procedure, risks, benefits and alternatives. The patient and patient's POA understands, agrees and consents for the procedure. All questions were addressed. A time out was performed prior to the initiation of the procedure. Maximal barrier sterile technique utilized including caps, mask, sterile gowns, sterile gloves, large sterile drape, hand hygiene, and Betadine prep. Ultrasound confirmed a patent right common femoral artery. Palpable pulses in the right posterior  tibial artery and right dorsalis pedis artery prior to the procedure. The right groin was prepped and draped in a sterile fashion. Maximal barrier sterile technique was utilized including caps, mask, sterile gowns, sterile gloves, sterile drape, hand hygiene and skin antiseptic. Right groin was anesthetized with 1% lidocaine. Using ultrasound guidance, 21 gauge needle directed into the right common femoral artery. Micropuncture dilator set and 5 French vascular sheath were placed. The inferior mesenteric artery was successfully cannulated with a Sos catheter. Multiple angiograms performed in the inferior mesenteric artery. Oblique images were obtained at the area of concern in the pelvis and sigmoid colon. The catheter was then used to cannulate the superior mesenteric artery. Superior mesenteric arteriogram was performed. The 5 French catheter was removed over a wire. Angiogram was performed through the right groin sheath. The right groin sheath was removed using an ExoSeal closure device. Hemostasis in the groin following removal of the catheter. Fluoroscopic images were taken and saved for this procedure. Ultrasound confirmed a patent right common femoral artery and an image was saved for documentation. FINDINGS: Inferior mesenteric artery. Inferior mesenteric artery is patent. Inferior mesenteric vein is patent. There is no evidence for contrast extravasation or active bleeding. No evidence for vascular malformation. Redundant sigmoid colon in the pelvis makes it difficult to evaluate this area. However, no acute abnormality identified with the oblique imaging. Superior mesenteric artery: SMA is patent. Incidentally, there is a replaced right hepatic artery coming off the proximal SMA. Portal venous system is patent. No contrast extravasation and no vascular malformation. IMPRESSION: Negative arteriography of the inferior mesenteric artery and superior mesenteric artery. No evidence for active bleeding or  vascular malformation. Electronically Signed   By:  Markus Daft M.D.   On: 05/14/2017 08:55   Ir US Guide Vasc Access Right  Result Date: 05/14/2017 INDICATION: 84 year old with GI bleeding. Positive nuclear medicine study. Nuclear medicine study suggested that the source of bleeding was in the sigmoid colon. EXAM: MESENTERIC ARTERIOGRAPHY: SELECTIVE ANGIOGRAPHY OF THE SUPERIOR MESENTERIC ARTERY AND SELECTIVE ANGIOGRAPHY OF THE INFERIOR MESENTERIC ARTERY ULTRASOUND GUIDANCE FOR VASCULAR ACCESS MEDICATIONS: None ANESTHESIA/SEDATION: Moderate (conscious) sedation was employed during this procedure. A total of Versed 0.5 mg and Fentanyl 25 mcg was administered intravenously. Moderate Sedation Time: 42 minutes. The patient's level of consciousness and vital signs were monitored continuously by radiology nursing throughout the procedure under my direct supervision. CONTRAST:  121 mL Isovue-300 FLUOROSCOPY TIME:  Fluoroscopy Time: 7 minutes 36 seconds (553 mGy). COMPLICATIONS: None immediate. PROCEDURE: Informed consent was obtained from the patient's POA following explanation of the procedure, risks, benefits and alternatives. The patient and patient's POA understands, agrees and consents for the procedure. All questions were addressed. A time out was performed prior to the initiation of the procedure. Maximal barrier sterile technique utilized including caps, mask, sterile gowns, sterile gloves, large sterile drape, hand hygiene, and Betadine prep. Ultrasound confirmed a patent right common femoral artery. Palpable pulses in the right posterior tibial artery and right dorsalis pedis artery prior to the procedure. The right groin was prepped and draped in a sterile fashion. Maximal barrier sterile technique was utilized including caps, mask, sterile gowns, sterile gloves, sterile drape, hand hygiene and skin antiseptic. Right groin was anesthetized with 1% lidocaine. Using ultrasound guidance, 21 gauge needle directed  into the right common femoral artery. Micropuncture dilator set and 5 French vascular sheath were placed. The inferior mesenteric artery was successfully cannulated with a Sos catheter. Multiple angiograms performed in the inferior mesenteric artery. Oblique images were obtained at the area of concern in the pelvis and sigmoid colon. The catheter was then used to cannulate the superior mesenteric artery. Superior mesenteric arteriogram was performed. The 5 French catheter was removed over a wire. Angiogram was performed through the right groin sheath. The right groin sheath was removed using an ExoSeal closure device. Hemostasis in the groin following removal of the catheter. Fluoroscopic images were taken and saved for this procedure. Ultrasound confirmed a patent right common femoral artery and an image was saved for documentation. FINDINGS: Inferior mesenteric artery. Inferior mesenteric artery is patent. Inferior mesenteric vein is patent. There is no evidence for contrast extravasation or active bleeding. No evidence for vascular malformation. Redundant sigmoid colon in the pelvis makes it difficult to evaluate this area. However, no acute abnormality identified with the oblique imaging. Superior mesenteric artery: SMA is patent. Incidentally, there is a replaced right hepatic artery coming off the proximal SMA. Portal venous system is patent. No contrast extravasation and no vascular malformation. IMPRESSION: Negative arteriography of the inferior mesenteric artery and superior mesenteric artery. No evidence for active bleeding or vascular malformation. Electronically Signed   By: Markus Daft M.D.   On: 05/14/2017 08:55   [place] Rt Upper Extrem Ltd Soft Tissue Non Vascular  Result Date: 05/16/2017 CLINICAL DATA:  Pain and redness of the right arm about the elbow for 2 days. EXAM: ULTRASOUND RIGHT UPPER EXTREMITY LIMITED TECHNIQUE: Ultrasound examination of the upper extremity soft tissues was performed in the  area of clinical concern. COMPARISON:  None. FINDINGS: Scanning was directed toward the region of concern. A tubular structure measuring 3.8 x 0.4 x 0.5 cm demonstrates internal echogenicity and does not compress.  The appearance is most consistent with a thrombosed subcutaneous vein. IMPRESSION: Findings most consistent with superficial thrombophlebitis in the region of concern. Electronically Signed   By: Inge Rise M.D.   On: 05/16/2017 14:20     Subjective: No issues  Discharge Exam: Vitals:   05/16/17 2039 05/17/17 0456  BP: 112/62 (!) 143/61  Pulse: 88 69  Resp: 20 18  Temp: 98 F (36.7 C) 98.1 F (36.7 C)  SpO2: 100% 100%   Vitals:   05/16/17 1358 05/16/17 1400 05/16/17 2039 05/17/17 0456  BP: (!) 113/41 (!) 122/51 112/62 (!) 143/61  Pulse: 82 85 88 69  Resp: 20 18 20 18   Temp: 97.8 F (36.6 C) 98.5 F (36.9 C) 98 F (36.7 C) 98.1 F (36.7 C)  TempSrc: Oral Oral Oral Oral  SpO2: 100% 99% 100% 100%  Weight:      Height:        General: Pt is alert, awake, not in acute distress Cardiovascular: RRR, S1/S2 +, no rubs, no gallops Respiratory: CTA bilaterally, no wheezing, no rhonchi Abdominal: Soft, NT, ND, bowel sounds + Extremities: no edema, no cyanosis    The results of significant diagnostics from this hospitalization (including imaging, microbiology, ancillary and laboratory) are listed below for reference.     Microbiology: Recent Results (from the past 240 hour(s))  MRSA PCR Screening     Status: None   Collection Time: 05/13/17  7:29 AM  Result Value Ref Range Status   MRSA by PCR NEGATIVE NEGATIVE Final    Comment:        The GeneXpert MRSA Assay (FDA approved for NASAL specimens only), is one component of a comprehensive MRSA colonization surveillance program. It is not intended to diagnose MRSA infection nor to guide or monitor treatment for MRSA infections. Performed at [facility], [place] [address] [place],  [place] [address]      Labs: BNP (last 3 results) No results for input(s): BNP in the last 8760 hours. Basic Metabolic Panel: Recent Labs  Lab 05/12/17 1611 05/14/17 0537 05/15/17 0249 05/16/17 0046 05/17/17 0649  NA 142 138 137 138 140  K 4.0 4.0 4.6 4.4 4.1  CL 105 107 106 105 103  CO2 28 22 23 27 27   GLUCOSE 98 114* 121* 113* 106*  BUN 19 15 13 9  21*  CREATININE 0.64 0.50 0.58 0.56 0.56  CALCIUM 9.2 8.1* 8.4* 8.4* 8.5*   Liver Function Tests: Recent Labs  Lab 05/12/17 1611  AST 14*  ALT 10*  ALKPHOS 52  BILITOT 0.4  PROT 6.3*  ALBUMIN 3.0*   No results for input(s): LIPASE, AMYLASE in the last 168 hours. No results for input(s): AMMONIA in the last 168 hours. CBC: Recent Labs  Lab 05/13/17 0539 05/13/17 1410 05/13/17 2248 05/14/17 0537  05/15/17 0249  05/15/17 1659 05/16/17 0046 05/16/17 1559 05/16/17 2157 05/17/17 0649  WBC 5.5 7.4 6.9 7.2  --  10.8*  --   --   --   --   --   --   HGB 10.0* 10.7* 9.8* 9.0*   < > 8.3*   < > 7.9* 7.7* 9.9* 8.4* 8.6*  HCT 31.5* 33.5* 30.4* 27.3*   < > 25.8*   < > 24.6* 23.9* 31.7* 25.8* 26.6*  MCV 95.5 94.1 94.7 94.5  --  95.6  --   --   --   --   --   --   PLT 197 183 192 179  --  205  --   --   --   --   --   --    < > = values in this interval not displayed.   Cardiac Enzymes: No results for input(s): CKTOTAL, CKMB, CKMBINDEX, TROPONINI in the last 168 hours. BNP: Invalid input(s): POCBNP CBG: No results for input(s): GLUCAP in the last 168 hours. D-Dimer No results for input(s): DDIMER in the last 72 hours. Hgb A1c No results for input(s): HGBA1C in the last 72 hours. Lipid Profile No results for input(s): CHOL, HDL, LDLCALC, TRIG, CHOLHDL, LDLDIRECT in the last 72 hours. Thyroid function studies No results for input(s): TSH, T4TOTAL, T3FREE, THYROIDAB in the last 72 hours.  Invalid input(s): FREET3 Anemia work up No results for input(s): VITAMINB12, FOLATE, FERRITIN, TIBC, IRON, RETICCTPCT in the last 72  hours. Urinalysis    Component Value Date/Time   COLORURINE YELLOW 07/25/2016 2225   APPEARANCEUR CLOUDY (A) 07/25/2016 2225   LABSPEC 1.013 07/25/2016 2225   PHURINE 6.0 07/25/2016 2225   GLUCOSEU NEGATIVE 07/25/2016 2225   HGBUR NEGATIVE 07/25/2016 2225   HGBUR trace-intact 10/02/2008 1200   BILIRUBINUR NEGATIVE 07/25/2016 2225   KETONESUR NEGATIVE 07/25/2016 2225   PROTEINUR NEGATIVE 07/25/2016 2225   UROBILINOGEN 1.0 04/28/2014 1607   NITRITE NEGATIVE 07/25/2016 2225   LEUKOCYTESUR SMALL (A) 07/25/2016 2225   Sepsis Labs Invalid input(s): PROCALCITONIN,  WBC,  LACTICIDVEN Microbiology Recent Results (from the past 240 hour(s))  MRSA PCR Screening     Status: None   Collection Time: 05/13/17  7:29 AM  Result Value Ref Range Status   MRSA by PCR NEGATIVE NEGATIVE Final    Comment:        The GeneXpert MRSA Assay (FDA approved for NASAL specimens only), is one component of a comprehensive MRSA colonization surveillance program. It is not intended to diagnose MRSA infection nor to guide or monitor treatment for MRSA infections. Performed at [facility], [place] [address], [place], [place] [address]      SIGNED:   Cordelia Poche, MD [facility] 05/17/2017, 12:51 PM Pager [phone]  If 7PM-7AM, please contact night-coverage [email or web address] Password [email or web address]

## 2017-05-17 NOTE — Progress Notes (Signed)
Report given to EMS and attempted to give report to [place] at [facility].  Will attempt again.  Danton Clap, RN

## 2017-05-17 NOTE — Clinical Social Work Placement (Signed)
Patient returning to [facility]). Facility aware of patient's discharge and confirmed patient's ability to return. [facility] contacted, patient's family notified. Patient's RN can call report to [phone], packet complete. CSW signing off, no other needs identified at this time.  CLINICAL SOCIAL WORK PLACEMENT  NOTE  Date:  05/17/2017  Patient Details  Name: Tracy Sharp MRN: [id number] Date of Birth: 02-16-1924  Clinical Social Work is seeking post-discharge placement for this patient at the [place] level of care (*CSW will initial, date and re-position this form in  chart as items are completed):  Yes   Patient/family provided with [place] Work Department's list of facilities offering this level of care within the geographic area requested by the patient (or if unable, by the patient's family).  Yes   Patient/family informed of their freedom to choose among providers that offer the needed level of care, that participate in Medicare, Medicaid or managed care program needed by the patient, have an available bed and are willing to accept the patient.  Yes   Patient/family informed of [facility]'s ownership interest in [facility] and [facility], as well as of the fact that they are under no obligation to receive care at these facilities.  PASRR submitted to EDS on       PASRR number received on       Existing PASRR number confirmed on 05/17/17     FL2 transmitted to all facilities in geographic area requested by pt/family on 05/17/17     FL2 transmitted to all facilities within larger geographic area on       Patient informed that his/her managed care company has contracts with or will negotiate with certain facilities, including the following:        Yes   Patient/family informed of bed offers received.  Patient chooses bed at [place]     Physician recommends and patient chooses bed at       Patient to be transferred to [facility] on 05/17/17.  Patient to be transferred to facility by [facility]     Patient family notified on 05/17/17 of transfer.  Name of family member notified:  [name]     PHYSICIAN       Additional Comment:    _______________________________________________ Burnis Medin, LCSW 05/17/2017, 2:27 PM

## 2017-05-18 ENCOUNTER — Non-Acute Institutional Stay (SKILLED_NURSING_FACILITY): Payer: Medicare Other | Admitting: Internal Medicine

## 2017-05-18 ENCOUNTER — Encounter: Payer: Self-pay | Admitting: Internal Medicine

## 2017-05-18 ENCOUNTER — Telehealth: Payer: Self-pay

## 2017-05-18 DIAGNOSIS — Z8719 Personal history of other diseases of the digestive system: Secondary | ICD-10-CM

## 2017-05-18 DIAGNOSIS — R0902 Hypoxemia: Secondary | ICD-10-CM | POA: Diagnosis not present

## 2017-05-18 DIAGNOSIS — D62 Acute posthemorrhagic anemia: Secondary | ICD-10-CM

## 2017-05-18 DIAGNOSIS — G609 Hereditary and idiopathic neuropathy, unspecified: Secondary | ICD-10-CM

## 2017-05-18 DIAGNOSIS — M069 Rheumatoid arthritis, unspecified: Secondary | ICD-10-CM

## 2017-05-18 DIAGNOSIS — I10 Essential (primary) hypertension: Secondary | ICD-10-CM

## 2017-05-18 DIAGNOSIS — F209 Schizophrenia, unspecified: Secondary | ICD-10-CM | POA: Diagnosis not present

## 2017-05-18 NOTE — Telephone Encounter (Signed)
Possible re-admission to facility. This is a patient you were seeing at [facility] . [facility] F/U is needed if patient was re-admitted to facility upon discharge. Hospital discharge from [facility] on 05/17/2017

## 2017-05-18 NOTE — Progress Notes (Signed)
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 prep. Ultrasound confirmed a patent right common femoral artery. Palpable pulses in the right posterior tibial artery and right dorsalis pedis artery prior to the procedure. The right groin was prepped and draped in a sterile fashion. Maximal barrier sterile technique was utilized including caps, mask, sterile gowns, sterile gloves, sterile drape, hand hygiene and skin antiseptic. Right groin was anesthetized with 1% lidocaine. Using ultrasound guidance, 21 gauge needle directed into the right common femoral artery. Micropuncture dilator set and 5 French vascular sheath were placed. The inferior mesenteric artery was successfully cannulated with a Sos catheter. Multiple angiograms performed in the inferior mesenteric artery. Oblique images were obtained at the area of concern in the pelvis and sigmoid colon. The catheter was then used to cannulate the superior mesenteric artery. Superior mesenteric arteriogram was performed.  The 5 French catheter was removed over a wire. Angiogram was performed through the right groin sheath. The right groin sheath was removed using an ExoSeal closure device. Hemostasis in the groin following removal of the catheter. Fluoroscopic images were taken and saved for this procedure. Ultrasound confirmed a patent right common femoral artery and an image was saved for documentation. FINDINGS: Inferior mesenteric artery. Inferior mesenteric artery is patent. Inferior mesenteric vein is patent. There is no evidence for contrast extravasation or active bleeding. No evidence for vascular malformation. Redundant sigmoid colon in the pelvis makes it difficult to evaluate this area. However, no acute abnormality identified with the oblique imaging. Superior mesenteric artery: SMA is patent. Incidentally, there is a replaced right hepatic artery coming off the proximal SMA. Portal venous system is patent. No contrast extravasation and no vascular malformation. IMPRESSION: Negative arteriography of the inferior mesenteric artery and superior mesenteric artery. No evidence for active bleeding or vascular malformation. Electronically Signed   By: Markus Daft M.D.   On: 05/14/2017 08:55    Assessment/Plan   ICD-10-CM   1. Anemia due to blood loss, acute D62    s/p PRBC transfusion  2. History of lower GI bleeding Z87.19   3. Schizophrenia, unspecified type ([place]) F20.9   4. Rheumatoid arthritis involving multiple sites, unspecified rheumatoid factor presence (HCC) M06.9   5. Hypoxia R09.02   6. Essential hypertension I10   7. Idiopathic peripheral neuropathy G60.9    Hold ASA for total of 2 weeks then resume. May need to d/w family in the future regarding benefits/risks of stopping ASA tx  Cont current meds as ordered  PT/OT/ST as ordered  [place] O2 as ordered  GOAL: short term rehab then continue long term care. Communicated with pt and nursing.  Will follow  [facility] NP to follow  Labs/tests ordered:  CBC  Celesta Funderburk S. Perlie Gold  [facility] and Adult Medicine [address] [place], [place] [address] [phone] Cell (Monday-Friday 8 AM - 5 PM) [phone] After 5 PM and follow prompts

## 2017-05-19 LAB — CBC AND DIFFERENTIAL
HCT: 30 — AB (ref 36–46)
HEMOGLOBIN: 10 — AB (ref 12.0–16.0)
Neutrophils Absolute: 7
PLATELETS: 311 (ref 150–399)
WBC: 9.7

## 2017-06-02 LAB — CBC AND DIFFERENTIAL
HCT: 30 — AB (ref 36–46)
Hemoglobin: 9.8 — AB (ref 12.0–16.0)
NEUTROS ABS: 3
PLATELETS: 215 (ref 150–399)
WBC: 5.8

## 2017-06-12 ENCOUNTER — Ambulatory Visit (HOSPITAL_COMMUNITY): Payer: Medicare Other | Attending: Rheumatology

## 2017-06-19 LAB — HEPATIC FUNCTION PANEL
ALT: 6 — AB (ref 7–35)
AST: 10 — AB (ref 13–35)
Alkaline Phosphatase: 58 (ref 25–125)
BILIRUBIN, TOTAL: 0.2

## 2017-06-19 LAB — CBC AND DIFFERENTIAL
HCT: 35 — AB (ref 36–46)
HEMOGLOBIN: 10.9 — AB (ref 12.0–16.0)
NEUTROS ABS: 5
PLATELETS: 213 (ref 150–399)
WBC: 7.1

## 2017-06-19 LAB — BASIC METABOLIC PANEL
BUN: 18 (ref 4–21)
CREATININE: 0.6 (ref 0.5–1.1)
Glucose: 112
Potassium: 4.1 (ref 3.4–5.3)
Sodium: 140 (ref 137–147)

## 2017-06-23 IMAGING — CR DG HIP (WITH OR WITHOUT PELVIS) 3-4V BILAT
4 series · 4 of 4 positions shown · non-contrast
Comparison: AP pelvis and right hip series of June 20, 2014

CLINICAL DATA: Status post fall 3-4 months ago with persistent
bilateral hip pain

EXAM:
DG HIP (WITH OR WITHOUT PELVIS) 3-4V BILAT

[t hip ap left]
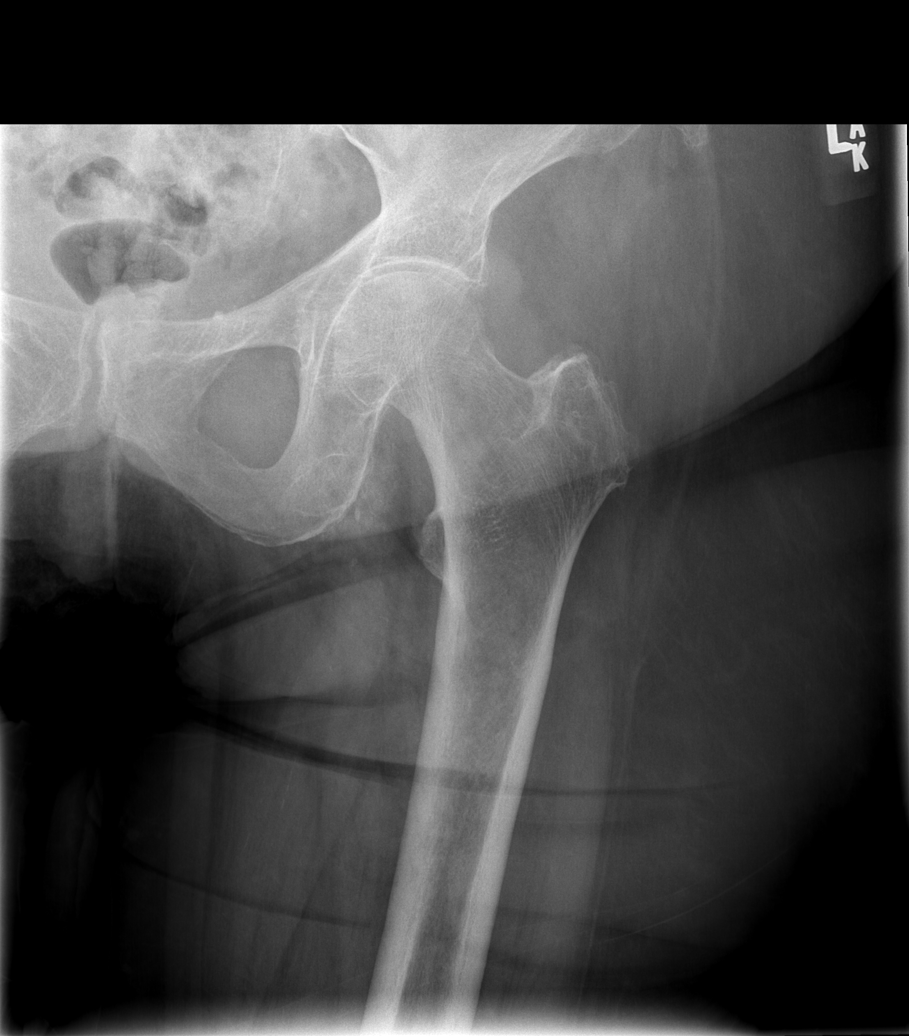

[t hip frog leg left]
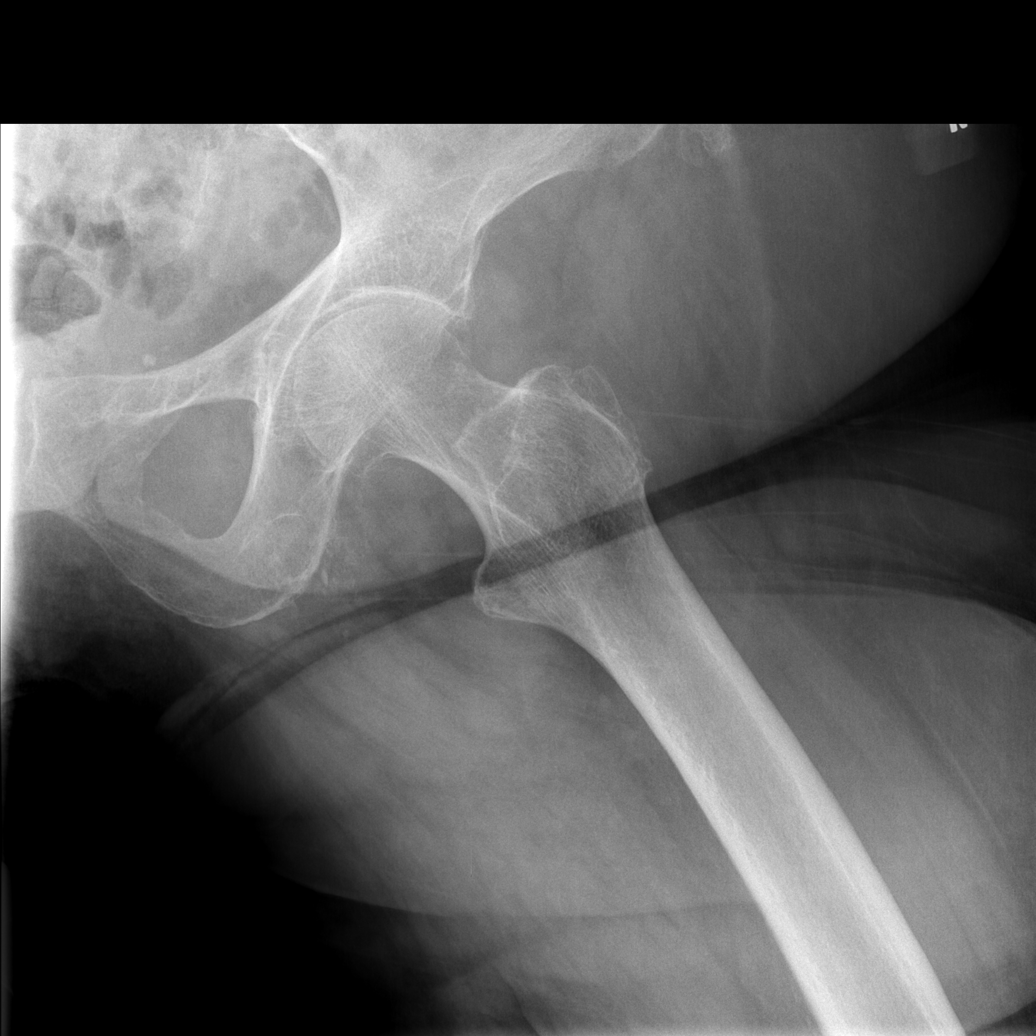

[t hip ap right]
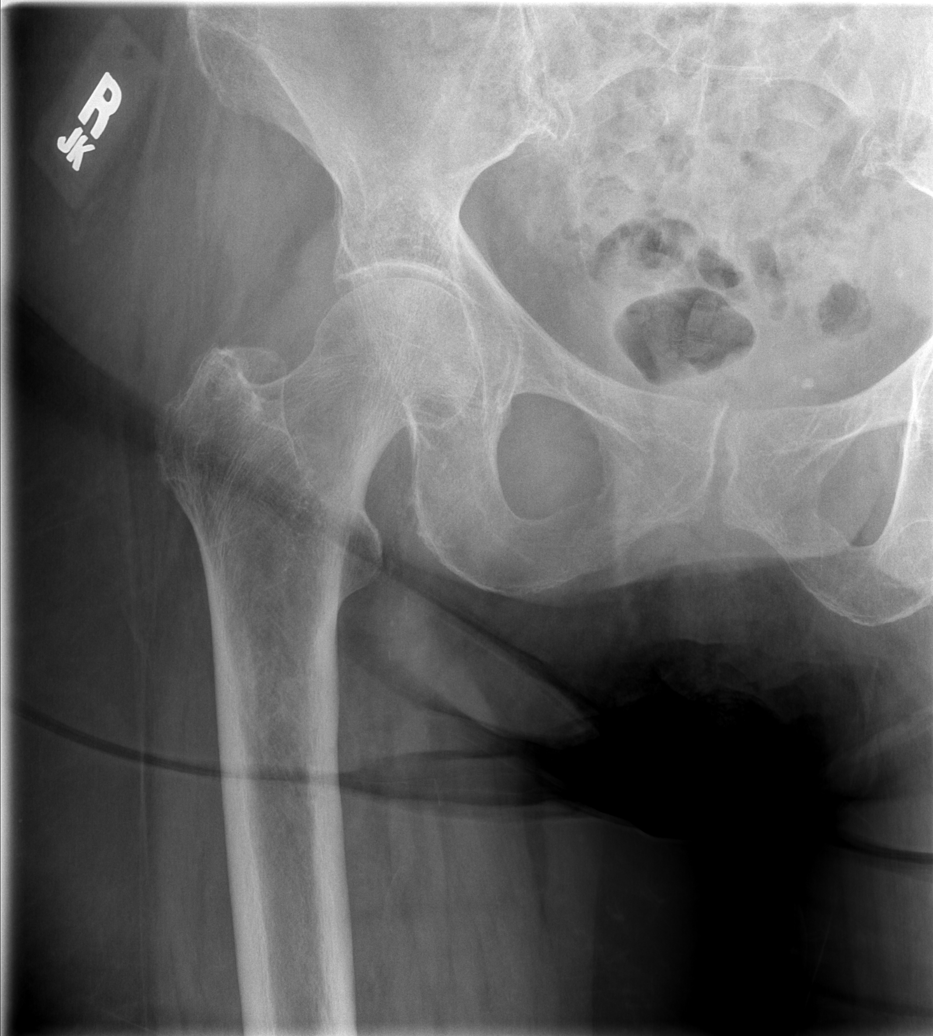

[t hip frog leg right]
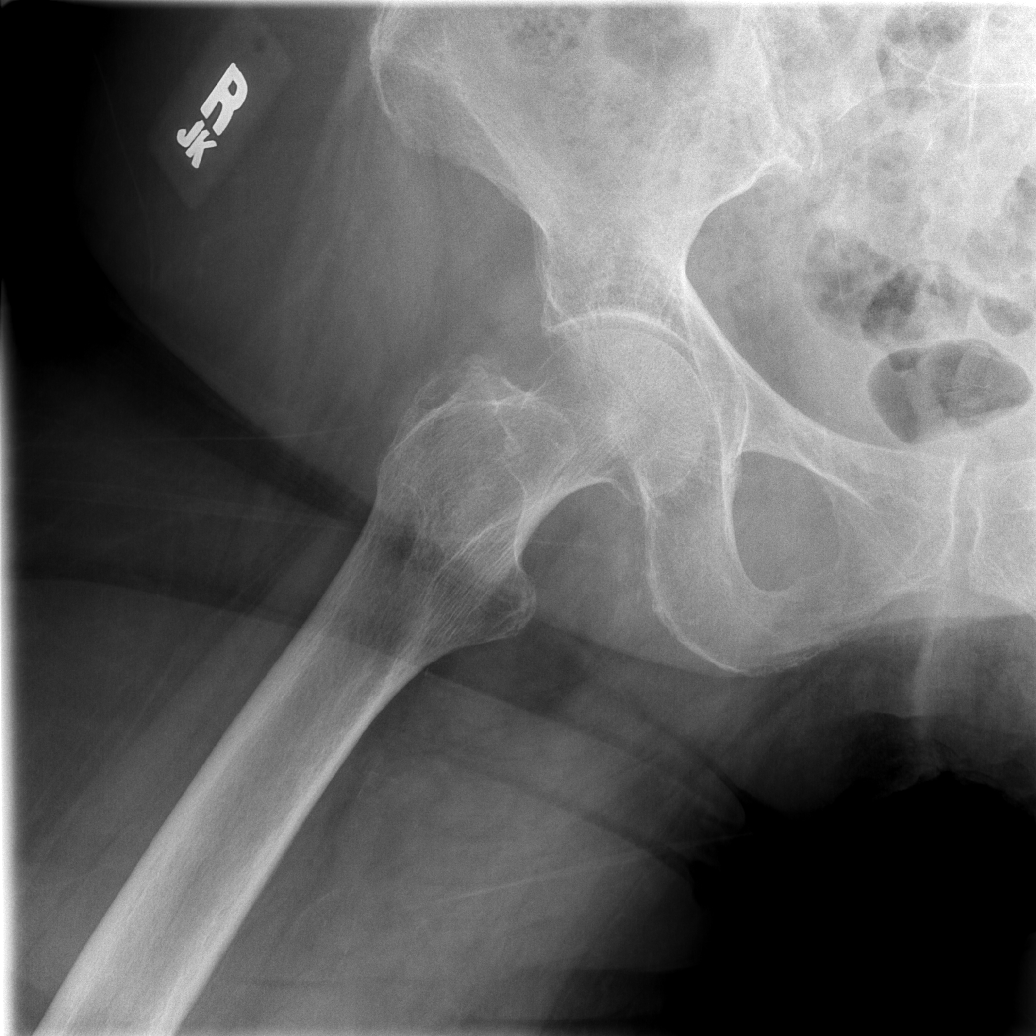

[4 of 4 positions shown; findings below may reference images not displayed]

FINDINGS: The bones are mildly osteopenic. There is mild symmetric narrowing
of the hip joint space is slightly greater on the left than on the
right. The femoral heads remain smoothly rounded as do the articular
surfaces of the acetabuli. The femoral necks, intertrochanteric
regions, and subtrochanteric regions are normal.
IMPRESSION: There are mild degenerative changes of both hips. There is no acute
fracture nor dislocation.

## 2017-07-06 ENCOUNTER — Non-Acute Institutional Stay (SKILLED_NURSING_FACILITY): Payer: Medicare Other | Admitting: Internal Medicine

## 2017-07-06 ENCOUNTER — Encounter: Payer: Self-pay | Admitting: Internal Medicine

## 2017-07-06 DIAGNOSIS — M069 Rheumatoid arthritis, unspecified: Secondary | ICD-10-CM

## 2017-07-06 DIAGNOSIS — R635 Abnormal weight gain: Secondary | ICD-10-CM | POA: Diagnosis not present

## 2017-07-06 DIAGNOSIS — I10 Essential (primary) hypertension: Secondary | ICD-10-CM | POA: Diagnosis not present

## 2017-07-06 DIAGNOSIS — Z79899 Other long term (current) drug therapy: Secondary | ICD-10-CM | POA: Diagnosis not present

## 2017-07-06 DIAGNOSIS — F209 Schizophrenia, unspecified: Secondary | ICD-10-CM | POA: Diagnosis not present

## 2017-07-06 DIAGNOSIS — E441 Mild protein-calorie malnutrition: Secondary | ICD-10-CM
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
## 2017-08-10 ENCOUNTER — Non-Acute Institutional Stay (SKILLED_NURSING_FACILITY): Payer: Medicare Other | Admitting: Internal Medicine

## 2017-08-10 ENCOUNTER — Encounter: Payer: Self-pay | Admitting: Internal Medicine

## 2017-08-10 DIAGNOSIS — R0902 Hypoxemia: Secondary | ICD-10-CM

## 2017-08-10 DIAGNOSIS — R5383 Other fatigue: Secondary | ICD-10-CM

## 2017-08-10 DIAGNOSIS — M069 Rheumatoid arthritis, unspecified: Secondary | ICD-10-CM | POA: Diagnosis not present

## 2017-08-10 DIAGNOSIS — F209 Schizophrenia, unspecified: Secondary | ICD-10-CM

## 2017-08-10 DIAGNOSIS — M810 Age-related osteoporosis without current pathological fracture: Secondary | ICD-10-CM | POA: Diagnosis not present

## 2017-08-10 DIAGNOSIS — I10 Essential (primary) hypertension: Secondary | ICD-10-CM | POA: Diagnosis not present
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
## 2017-08-16 ENCOUNTER — Encounter (HOSPITAL_COMMUNITY)
Admission: RE | Admit: 2017-08-16 | Discharge: 2017-08-16 | Disposition: A | Discharge status: Home / Self Care | Payer: Medicare Other | Source: Ambulatory Visit | Attending: Rheumatology | Admitting: Rheumatology

## 2017-08-16 ENCOUNTER — Encounter (HOSPITAL_COMMUNITY): Payer: Self-pay

## 2017-08-16 DIAGNOSIS — M810 Age-related osteoporosis without current pathological fracture: Secondary | ICD-10-CM | POA: Diagnosis not present

## 2017-08-16 MED ORDER — SODIUM CHLORIDE 0.9 % IV SOLN
Freq: Once | INTRAVENOUS | Status: AC
  Administered 2017-08-16: 12:00:00 via INTRAVENOUS

## 2017-08-16 MED ORDER — ZOLEDRONIC ACID 5 MG/100ML IV SOLN
5.0000 mg | Freq: Once | INTRAVENOUS | Status: AC
  Administered 2017-08-16: 5 mg via INTRAVENOUS
  Filled 2017-08-16: qty 100

## 2017-08-16 NOTE — Discharge Instructions (Signed)
Zoledronic Acid injection (Paget's Disease, Osteoporosis) / reclast °What is this medicine? °ZOLEDRONIC ACID (ZOE le dron ik AS id) lowers the amount of calcium loss from bone. It is used to treat Paget's disease and osteoporosis in women. °This medicine may be used for other purposes; ask your health care provider or pharmacist if you have questions. °COMMON BRAND NAME(S): Reclast, Zometa °What should I tell my health care provider before I take this medicine? °They need to know if you have any of these conditions: °-aspirin-sensitive asthma °-cancer, especially if you are receiving medicines used to treat cancer °-dental disease or wear dentures °-infection °-kidney disease °-low levels of calcium in the blood °-past surgery on the parathyroid gland or intestines °-receiving corticosteroids like dexamethasone or prednisone °-an unusual or allergic reaction to zoledronic acid, other medicines, foods, dyes, or preservatives °-pregnant or trying to get pregnant °-breast-feeding °How should I use this medicine? °This medicine is for infusion into a vein. It is given by a health care professional in a hospital or clinic setting. °Talk to your pediatrician regarding the use of this medicine in children. This medicine is not approved for use in children. °Overdosage: If you think you have taken too much of this medicine contact a poison control center or emergency room at once. °NOTE: This medicine is only for you. Do not share this medicine with others. °What if I miss a dose? °It is important not to miss your dose. Call your doctor or health care professional if you are unable to keep an appointment. °What may interact with this medicine? °-certain antibiotics given by injection °-NSAIDs, medicines for pain and inflammation, like ibuprofen or naproxen °-some diuretics like bumetanide, furosemide °-teriparatide °This list may not describe all possible interactions. Give your health care provider a list of all the  medicines, herbs, non-prescription drugs, or dietary supplements you use. Also tell them if you smoke, drink alcohol, or use illegal drugs. Some items may interact with your medicine. °What should I watch for while using this medicine? °Visit your doctor or health care professional for regular checkups. It may be some time before you see the benefit from this medicine. Do not stop taking your medicine unless your doctor tells you to. Your doctor may order blood tests or other tests to see how you are doing. °Women should inform their doctor if they wish to become pregnant or think they might be pregnant. There is a potential for serious side effects to an unborn child. Talk to your health care professional or pharmacist for more information. °You should make sure that you get enough calcium and vitamin D while you are taking this medicine. Discuss the foods you eat and the vitamins you take with your health care professional. °Some people who take this medicine have severe bone, joint, and/or muscle pain. This medicine may also increase your risk for jaw problems or a broken thigh bone. Tell your doctor right away if you have severe pain in your jaw, bones, joints, or muscles. Tell your doctor if you have any pain that does not go away or that gets worse. °Tell your dentist and dental surgeon that you are taking this medicine. You should not have major dental surgery while on this medicine. See your dentist to have a dental exam and fix any dental problems before starting this medicine. Take good care of your teeth while on this medicine. Make sure you see your dentist for regular follow-up appointments. °What side effects may I notice from receiving   this medicine? °Side effects that you should report to your doctor or health care professional as soon as possible: °-allergic reactions like skin rash, itching or hives, swelling of the face, lips, or tongue °-anxiety, confusion, or depression °-breathing  problems °-changes in vision °-eye pain °-feeling faint or lightheaded, falls °-jaw pain, especially after dental work °-mouth sores °-muscle cramps, stiffness, or weakness °-redness, blistering, peeling or loosening of the skin, including inside the mouth °-trouble passing urine or change in the amount of urine °Side effects that usually do not require medical attention (report to your doctor or health care professional if they continue or are bothersome): °-bone, joint, or muscle pain °-constipation °-diarrhea °-fever °-hair loss °-irritation at site where injected °-loss of appetite °-nausea, vomiting °-stomach upset °-trouble sleeping °-trouble swallowing °-weak or tired °This list may not describe all possible side effects. Call your doctor for medical advice about side effects. You may report side effects to FDA at 1-800-FDA-1088. °Where should I keep my medicine? °This drug is given in a hospital or clinic and will not be stored at home. °NOTE: This sheet is a summary. It may not cover all possible information. If you have questions about this medicine, talk to your doctor, pharmacist, or health care provider. °© 2018 [employer] (2013-06-15 14:19:57) ° °

## 2017-08-16 NOTE — Progress Notes (Signed)
Report called to Windell Moulding, RN at [facility] post infusion.  Notified [name] patient had an episode of feeling "swimmy headed" after returning from the restroom.  Initial BP elevated however BP continued to tend back to normal after a period of rest.  Upon discharge patient stated he felt fine with no issues noted.  Tracy Sharp, daughter in law accompanied patient.

## 2017-09-16 DIAGNOSIS — Z79899 Other long term (current) drug therapy: Secondary | ICD-10-CM | POA: Insufficient documentation

## 2017-09-20 ENCOUNTER — Encounter: Payer: Self-pay | Admitting: Internal Medicine

## 2017-10-16 ENCOUNTER — Encounter: Payer: Self-pay | Admitting: Internal Medicine

## 2017-10-16 ENCOUNTER — Non-Acute Institutional Stay (SKILLED_NURSING_FACILITY): Payer: Medicare Other | Admitting: Internal Medicine

## 2017-10-16 DIAGNOSIS — G894 Chronic pain syndrome: Secondary | ICD-10-CM

## 2017-10-16 DIAGNOSIS — I10 Essential (primary) hypertension: Secondary | ICD-10-CM | POA: Diagnosis not present

## 2017-10-16 DIAGNOSIS — G609 Hereditary and idiopathic neuropathy, unspecified: Secondary | ICD-10-CM

## 2017-10-16 DIAGNOSIS — F209 Schizophrenia, unspecified: Secondary | ICD-10-CM

## 2017-10-16 DIAGNOSIS — M069 Rheumatoid arthritis, unspecified: Secondary | ICD-10-CM
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
## 2018-07-02 DEATH — deceased
